# Patient Record
Sex: Male | Born: 1935 | Race: White | Hispanic: No | Smoking: Never smoker
Health system: Southern US, Community
[De-identification: ages and names within clinical notes are randomized; demographics above are authoritative.]

## PROBLEM LIST (undated history)

## (undated) DIAGNOSIS — I340 Nonrheumatic mitral (valve) insufficiency: Secondary | ICD-10-CM

## (undated) DIAGNOSIS — E039 Hypothyroidism, unspecified: Secondary | ICD-10-CM

## (undated) DIAGNOSIS — I4891 Unspecified atrial fibrillation: Secondary | ICD-10-CM

## (undated) DIAGNOSIS — M353 Polymyalgia rheumatica: Secondary | ICD-10-CM

## (undated) DIAGNOSIS — K219 Gastro-esophageal reflux disease without esophagitis: Secondary | ICD-10-CM

## (undated) DIAGNOSIS — IMO0001 Reserved for inherently not codable concepts without codable children: Secondary | ICD-10-CM

## (undated) DIAGNOSIS — J439 Emphysema, unspecified: Secondary | ICD-10-CM

## (undated) DIAGNOSIS — L8990 Pressure ulcer of unspecified site, unspecified stage: Secondary | ICD-10-CM

## (undated) DIAGNOSIS — I10 Essential (primary) hypertension: Secondary | ICD-10-CM

## (undated) DIAGNOSIS — N200 Calculus of kidney: Secondary | ICD-10-CM

## (undated) DIAGNOSIS — G4700 Insomnia, unspecified: Secondary | ICD-10-CM

## (undated) DIAGNOSIS — N184 Chronic kidney disease, stage 4 (severe): Secondary | ICD-10-CM

## (undated) DIAGNOSIS — I350 Nonrheumatic aortic (valve) stenosis: Secondary | ICD-10-CM

## (undated) DIAGNOSIS — R972 Elevated prostate specific antigen [PSA]: Secondary | ICD-10-CM

## (undated) HISTORY — DX: Elevated prostate specific antigen (PSA): R97.20

## (undated) HISTORY — DX: Gastro-esophageal reflux disease without esophagitis: K21.9

## (undated) HISTORY — PX: APPENDECTOMY: SHX54

## (undated) HISTORY — DX: Nonrheumatic aortic (valve) stenosis: I35.0

## (undated) HISTORY — DX: Calculus of kidney: N20.0

## (undated) HISTORY — DX: Hypothyroidism, unspecified: E03.9

## (undated) HISTORY — DX: Reserved for inherently not codable concepts without codable children: IMO0001

## (undated) HISTORY — PX: THYROIDECTOMY: SHX17

## (undated) HISTORY — DX: Pressure ulcer of unspecified site, unspecified stage: L89.90

## (undated) HISTORY — DX: Insomnia, unspecified: G47.00

## (undated) HISTORY — PX: CHOLECYSTECTOMY: SHX55

## (undated) HISTORY — DX: Polymyalgia rheumatica: M35.3

## (undated) HISTORY — PX: TONSILLECTOMY: SUR1361

---

## 2001-03-10 ENCOUNTER — Encounter: Payer: Self-pay | Admitting: Family Medicine

## 2001-03-10 ENCOUNTER — Inpatient Hospital Stay (HOSPITAL_COMMUNITY): Admission: RE | Admit: 2001-03-10 | Discharge: 2001-03-12 | Payer: Self-pay | Admitting: Family Medicine

## 2001-03-11 ENCOUNTER — Encounter: Payer: Self-pay | Admitting: Cardiology

## 2001-11-21 ENCOUNTER — Encounter: Payer: Self-pay | Admitting: Emergency Medicine

## 2001-11-21 ENCOUNTER — Emergency Department (HOSPITAL_COMMUNITY): Admission: EM | Admit: 2001-11-21 | Discharge: 2001-11-21 | Payer: Self-pay | Admitting: Emergency Medicine

## 2001-11-23 ENCOUNTER — Encounter: Payer: Self-pay | Admitting: *Deleted

## 2001-11-23 ENCOUNTER — Emergency Department (HOSPITAL_COMMUNITY): Admission: EM | Admit: 2001-11-23 | Discharge: 2001-11-23 | Payer: Self-pay | Admitting: *Deleted

## 2001-11-24 ENCOUNTER — Ambulatory Visit (HOSPITAL_COMMUNITY): Admission: RE | Admit: 2001-11-24 | Discharge: 2001-11-24 | Payer: Self-pay | Admitting: Urology

## 2001-11-24 ENCOUNTER — Encounter: Payer: Self-pay | Admitting: Urology

## 2001-11-26 ENCOUNTER — Ambulatory Visit (HOSPITAL_COMMUNITY): Admission: RE | Admit: 2001-11-26 | Discharge: 2001-11-26 | Payer: Self-pay

## 2001-11-26 ENCOUNTER — Encounter: Payer: Self-pay | Admitting: Urology

## 2002-02-18 ENCOUNTER — Ambulatory Visit (HOSPITAL_COMMUNITY): Admission: RE | Admit: 2002-02-18 | Discharge: 2002-02-18 | Payer: Self-pay | Admitting: Urology

## 2002-02-18 ENCOUNTER — Encounter: Payer: Self-pay | Admitting: Urology

## 2002-02-26 ENCOUNTER — Encounter: Payer: Self-pay | Admitting: Urology

## 2002-02-26 ENCOUNTER — Ambulatory Visit (HOSPITAL_COMMUNITY): Admission: RE | Admit: 2002-02-26 | Discharge: 2002-02-26 | Payer: Self-pay | Admitting: Urology

## 2002-03-10 ENCOUNTER — Encounter: Payer: Self-pay | Admitting: Urology

## 2002-03-10 ENCOUNTER — Ambulatory Visit (HOSPITAL_COMMUNITY): Admission: RE | Admit: 2002-03-10 | Discharge: 2002-03-10 | Payer: Self-pay | Admitting: Urology

## 2002-03-24 ENCOUNTER — Ambulatory Visit (HOSPITAL_COMMUNITY): Admission: RE | Admit: 2002-03-24 | Discharge: 2002-03-24 | Payer: Self-pay | Admitting: Urology

## 2002-03-24 ENCOUNTER — Encounter: Payer: Self-pay | Admitting: Urology

## 2002-04-16 ENCOUNTER — Encounter: Payer: Self-pay | Admitting: Urology

## 2002-04-16 ENCOUNTER — Ambulatory Visit (HOSPITAL_COMMUNITY): Admission: RE | Admit: 2002-04-16 | Discharge: 2002-04-16 | Payer: Self-pay | Admitting: Urology

## 2002-05-01 ENCOUNTER — Ambulatory Visit (HOSPITAL_COMMUNITY): Admission: RE | Admit: 2002-05-01 | Discharge: 2002-05-01 | Payer: Self-pay | Admitting: Urology

## 2003-02-26 ENCOUNTER — Ambulatory Visit (HOSPITAL_COMMUNITY): Admission: RE | Admit: 2003-02-26 | Discharge: 2003-02-26 | Payer: Self-pay | Admitting: Family Medicine

## 2004-09-15 ENCOUNTER — Ambulatory Visit (HOSPITAL_COMMUNITY): Admission: RE | Admit: 2004-09-15 | Discharge: 2004-09-15 | Payer: Self-pay | Admitting: Family Medicine

## 2004-09-26 ENCOUNTER — Ambulatory Visit: Payer: Self-pay | Admitting: Internal Medicine

## 2004-11-21 ENCOUNTER — Ambulatory Visit (HOSPITAL_COMMUNITY): Admission: RE | Admit: 2004-11-21 | Discharge: 2004-11-21 | Payer: Self-pay | Admitting: Family Medicine

## 2004-12-06 ENCOUNTER — Ambulatory Visit: Payer: Self-pay | Admitting: Orthopedic Surgery

## 2004-12-11 ENCOUNTER — Encounter (HOSPITAL_COMMUNITY): Admission: RE | Admit: 2004-12-11 | Discharge: 2005-01-10 | Payer: Self-pay | Admitting: Orthopedic Surgery

## 2005-01-12 ENCOUNTER — Encounter (HOSPITAL_COMMUNITY): Admission: RE | Admit: 2005-01-12 | Discharge: 2005-01-19 | Payer: Self-pay | Admitting: Orthopedic Surgery

## 2005-01-17 ENCOUNTER — Ambulatory Visit: Payer: Self-pay | Admitting: Orthopedic Surgery

## 2005-10-24 ENCOUNTER — Ambulatory Visit: Payer: Self-pay | Admitting: Internal Medicine

## 2005-10-25 ENCOUNTER — Ambulatory Visit (HOSPITAL_COMMUNITY): Admission: RE | Admit: 2005-10-25 | Discharge: 2005-10-25 | Payer: Self-pay | Admitting: Internal Medicine

## 2005-10-30 ENCOUNTER — Ambulatory Visit (HOSPITAL_COMMUNITY): Admission: RE | Admit: 2005-10-30 | Discharge: 2005-10-30 | Payer: Self-pay | Admitting: Internal Medicine

## 2006-02-06 ENCOUNTER — Ambulatory Visit: Payer: Self-pay | Admitting: Internal Medicine

## 2006-12-23 ENCOUNTER — Other Ambulatory Visit: Admission: RE | Admit: 2006-12-23 | Discharge: 2006-12-23 | Payer: Self-pay | Admitting: General Surgery

## 2006-12-23 ENCOUNTER — Encounter (INDEPENDENT_AMBULATORY_CARE_PROVIDER_SITE_OTHER): Payer: Self-pay | Admitting: General Surgery

## 2007-03-04 ENCOUNTER — Ambulatory Visit: Payer: Self-pay | Admitting: Internal Medicine

## 2007-03-06 ENCOUNTER — Ambulatory Visit (HOSPITAL_COMMUNITY): Admission: RE | Admit: 2007-03-06 | Discharge: 2007-03-06 | Payer: Self-pay | Admitting: Internal Medicine

## 2007-03-17 ENCOUNTER — Ambulatory Visit (HOSPITAL_COMMUNITY): Admission: RE | Admit: 2007-03-17 | Discharge: 2007-03-17 | Payer: Self-pay | Admitting: Internal Medicine

## 2007-03-17 ENCOUNTER — Encounter: Payer: Self-pay | Admitting: Internal Medicine

## 2007-03-17 ENCOUNTER — Ambulatory Visit: Payer: Self-pay | Admitting: Internal Medicine

## 2007-03-24 ENCOUNTER — Ambulatory Visit (HOSPITAL_COMMUNITY): Admission: RE | Admit: 2007-03-24 | Discharge: 2007-03-24 | Payer: Self-pay | Admitting: Internal Medicine

## 2007-04-07 ENCOUNTER — Encounter (HOSPITAL_COMMUNITY): Admission: RE | Admit: 2007-04-07 | Discharge: 2007-05-07 | Payer: Self-pay | Admitting: Internal Medicine

## 2007-04-14 ENCOUNTER — Ambulatory Visit: Payer: Self-pay | Admitting: Cardiology

## 2007-04-16 ENCOUNTER — Inpatient Hospital Stay (HOSPITAL_COMMUNITY): Admission: RE | Admit: 2007-04-16 | Discharge: 2007-04-18 | Payer: Self-pay | Admitting: General Surgery

## 2007-04-16 ENCOUNTER — Encounter (INDEPENDENT_AMBULATORY_CARE_PROVIDER_SITE_OTHER): Payer: Self-pay | Admitting: General Surgery

## 2008-03-13 ENCOUNTER — Emergency Department (HOSPITAL_COMMUNITY): Admission: EM | Admit: 2008-03-13 | Discharge: 2008-03-13 | Payer: Self-pay | Admitting: Emergency Medicine

## 2008-10-05 DIAGNOSIS — I10 Essential (primary) hypertension: Secondary | ICD-10-CM

## 2008-10-05 DIAGNOSIS — K828 Other specified diseases of gallbladder: Secondary | ICD-10-CM

## 2010-01-09 ENCOUNTER — Ambulatory Visit (HOSPITAL_COMMUNITY)
Admission: RE | Admit: 2010-01-09 | Discharge: 2010-01-09 | Payer: Self-pay | Source: Home / Self Care | Attending: Endocrinology | Admitting: Endocrinology

## 2010-02-10 ENCOUNTER — Ambulatory Visit (HOSPITAL_COMMUNITY): Admission: RE | Admit: 2010-02-10 | Payer: Self-pay | Source: Home / Self Care | Admitting: Endocrinology

## 2010-03-01 ENCOUNTER — Encounter: Payer: Self-pay | Admitting: Endocrinology

## 2010-05-16 LAB — URINALYSIS, ROUTINE W REFLEX MICROSCOPIC
Bilirubin Urine: NEGATIVE
Glucose, UA: NEGATIVE mg/dL
Hgb urine dipstick: NEGATIVE
Ketones, ur: NEGATIVE mg/dL
Leukocytes, UA: NEGATIVE
Nitrite: NEGATIVE
Protein, ur: 30 mg/dL — AB
Specific Gravity, Urine: >1.03 — ABNORMAL HIGH (ref 1.005–1.030)
Urobilinogen, UA: 0.2 mg/dL (ref 0.0–1.0)
pH: 5.5 (ref 5.0–8.0)

## 2010-05-16 LAB — URINE CULTURE
Colony Count: NO GROWTH
Culture: NO GROWTH

## 2010-05-16 LAB — URINE MICROSCOPIC-ADD ON

## 2010-05-22 ENCOUNTER — Other Ambulatory Visit (HOSPITAL_COMMUNITY): Payer: Self-pay | Admitting: Endocrinology

## 2010-05-22 DIAGNOSIS — E041 Nontoxic single thyroid nodule: Secondary | ICD-10-CM

## 2010-06-13 NOTE — Op Note (Signed)
NAME:  JHALEN, ELEY NO.:  [id number]   MEDICAL RECORD NO.:  [id number]          PATIENT TYPE:  INP   LOCATION:  A338                          FACILITY:  [facility]   PHYSICIAN:  Barbaraann Barthel, M.D. DATE OF BIRTH:  1921-12-26   DATE OF PROCEDURE:  04/16/2007  DATE OF DISCHARGE:                               OPERATIVE REPORT   SURGEON:  Barbaraann Barthel, M.D.   PREOPERATIVE DIAGNOSIS:  Cholecystitis secondary to biliary dyskinesia.   POSTOPERATIVE DIAGNOSIS:  Cholecystitis secondary to biliary dyskinesia.   PROCEDURE:  Laparoscopic cholecystectomy.   SPECIMEN:  Gallbladder.   NOTE:  This is an 77 year old white male who had recurrent episodes of  nausea with some vomiting and right upper quadrant pain.  He had a  sonogram which showed biliary sludge and polyp.  He had no obvious  stones; however, he had a complete GI workup including an upper GI and a  lower GI as well.  Hepatobiliary scan showed that he had an  nonfunctioning gallbladder with less than a 1% ejection fraction.   He was referred by the GI service for gallbladder surgery.   We discussed the procedure in detail with him and his family.  His  daughter had been a gallbladder patient of mine and all questions were  addressed knowledgeably by the family.   We discussed complications not limited to but including bleeding,  infection, damage to bile ducts, perforation of organs and the  possibility that open surgery might be required.  The also discussed  that he might have some looser stools after surgery as well.  We also  discussed the fact that the results for cholecystectomy for biliary  dyskinesia are not as satisfying as the results for cholecystitis  secondary to gallstones.  Informed consent was obtained.   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  Dilated stomach and bowel but no  abnormalities encountered.   TECHNIQUE:  The patient was placed in the supine position and after the  adequate administration of general anesthesia via endotracheal  intubation, a Foley catheter was aseptically inserted.  His abdomen was  prepped with Betadine solution and draped in the usual manner and a  periumbilical incision was carried out over the superior aspect of the  umbilicus.  The fascia was grasped with a sharp towel clip and a Veress  needle was inserted and confirmed in position with a saline drop test.  We then we placed an 11-mm cannula in the umbilicus using the Visiport  technique and then under direct vision another 11-mm cannula was placed  in the epigastrium and two 5-mm cannulas were placed in the right upper  quadrant laterally.  The gallbladder was grasped.  Its adhesions were  taken down.  Cystic duct was clearly visualized, as was the cystic  artery.  This was triply silver-clipped and divided and the gallbladder  was removed from the gallbladder bed with minimal amount of spilling and  with a minimal amount of oozing.  The  patient did have a little oozing  from a very friable liver from the capsule of Gleason being so friable,  but this was easily controlled with the cautery device.  The gallbladder  was then removed placing it in an EndoCatch device.  We then irrigated,  checked for hemostasis.  I elected to leave a Jackson-Pratt drain in the  liver bed as well as some Surgicel in the liver bed as well.  The  abdomen was then desufflated.  Prior to closure all sponge, needle and  instrument counts were found to be correct.  The fascia in the area of  the umbilicus and the epigastrium was closed with 0 Polysorb sutures and  the skin was approximated with a stapling device.  I did use 0.5%  Sensorcaine at all port sites for postoperative comfort.  The drain was  sutured in place with 3-0 nylon.  The patient was taken to recovery room  in satisfactory  condition.      Barbaraann Barthel, M.D.  Electronically Signed     WB/MEDQ  D:  04/16/2007  T:  04/16/2007  Job:  [id number]   cc:   Donna Bernard, M.D.  Fax: [phone]

## 2010-06-13 NOTE — Letter (Signed)
April 14, 2007    Barbaraann Barthel, M.D.  [address]  [place], [place] [address]   RE:  Jeffrey Mccarty, Jeffrey Mccarty  MRN:  [id number]  /  DOB:  Apr 28, 1921   Dear Bronson Curb:   It was my pleasure evaluating Jeffrey Mccarty postoperatively in  consultation at your request.  As you know, this nice gentleman has  enjoyed generally excellent health.  He has had hypertension that has  been well controlled with medical therapy.  He reports no prior cardiac  problems.  He has never been evaluated by a cardiologist nor undergone  any significant cardiac testing.  He has had no pulmonary diseases.  He  is unaware of his cholesterol status.  He was active in the past, but  subsequently developed a rotator cuff tear in his right shoulder and is  relatively sedentary at the present time.  He has lost some weight, has  had a decreased appetite, chronic upper abdominal pain and a  dysfunctional gallbladder without cholecystolithiasis.   PAST MEDICAL HISTORY:  Otherwise notable for:  1. Appendectomy, carried out remotely.  2. A partial thyroidectomy, carried out remotely.   ALLERGIES:  No known drug allergies.   CURRENT MEDICATIONS:  1. Xanax 0.25 mg q.h.s.  2. Nexium 40 mg daily.  3. Lotensin/HCT 20/12.5 mg daily.   SOCIAL HISTORY:  A widower for the past 14 years.  Has two adult  daughters who live locally and provide him with a great deal of  assistance.  He is retired from a second career in Aeronautical engineer.   FAMILY HISTORY:  Positive for emphysema in his father, who still managed  to live into his 40s.  His mother died at age 33 in childbirth.  He has  five siblings, none of whom have known coronary disease.   REVIEW OF SYSTEMS:  Notable for the need for corrective lenses,  bilateral cataract extractions in the past, moderate to severe hearing  impairment, upper and lower dentures, a poor appetite with intermittent  nausea but no emesis, history of peptic ulcer disease and GERD.  He has  urinary frequency and  nocturia.  All other systems reviewed and are  negative.   PHYSICAL EXAMINATION:  GENERAL:  A slim older gentleman in no acute  distress.  VITAL SIGNS:  The weight is 114, blood pressure 110/70, heart rate 85  with frequent irregularities, respirations 18.  HEENT:  Extraocular movements are full.  Anicteric sclerae.  Normal lids  and conjunctivae.  Normal oral mucosa.  NECK:  Thyroidectomy scar is barely appreciated.  No jugular venous  distention; normal carotid upstrokes without definite bruits.  ENDOCRINE:  No thyromegaly.  HEMATOPOIETIC:  No adenopathy.  LUNGS:  Clear.  CARDIAC:  Normal first and second heart sounds.  A grade 2/6 basilar  systolic ejection murmur.  Normal PMI.  ABDOMEN:  Soft with mild upper abdominal tenderness; normal bowel  sounds; no bruits; no organomegaly.  EXTREMITIES:  No edema; distal pulses intact.  NEUROLOGIC:  Symmetric strength and tone; normal cranial nerves.   ELECTROCARDIOGRAM:  Normal sinus rhythm; nondiagnostic lateral Q-waves;  moderately frequent PACs; nonspecific ST-segment abnormality; minimal  voltage criteria for LVH.   IMPRESSION:  Jeffrey Mccarty has no history to suggest significant  cardiovascular disease.  He does have minor supraventricular ectopy, but  this is not predictive of any significant cardiac issues.  He has a  systolic murmur that probably represents a degree of aortic valvular  sclerosis.  There is nothing to suggest significant stenosis.  He does  not require any specific cardiopulmonary testing prior to his planned  surgical procedure.  His blood pressure control is  excellent.  If his weight loss continues, he may need to have his dose  of antihypertensive medication decreased.  Please let us know if we can  provide any assistance during his hospital admission for  cholecystectomy.    Sincerely,      Gerrit Friends. Dietrich Pates, MD, [facility]  Electronically Signed    RMR/MedQ  DD: 04/14/2007  DT: 04/14/2007  Job #:  [id number]   CC:    W. Simone Curia, M.D.

## 2010-06-13 NOTE — Assessment & Plan Note (Signed)
NAMEMarland Kitchen  Jeffrey, Mccarty                  CHART#:  [id number]   DATE:  03/04/2007                       DOB:  1921/05/05   Formerly, the patient went to Dr. Dionicia Abler.  The patient has chosen to stay  in the practice with [place].  The patient was last seen on 02/06/2006.  He  has a history of gastroesophageal reflux disease, anorexia and weight  loss.  At that time he was noted to weigh 123 pounds, and overall was  doing fairly well.  He had a cystic lesion in his pancreas which  warranted further followup.  In reviewing the chart, we had put in an  order for him to have a followup pancreatic protocol CT in March;  however, this did not get done, and the patient and his oldest daughter,  Jeffrey Mccarty, who accompanies him today really do not know a thing about it.  The patient said he had gastroenteritis over the holidays.  He has had  nausea, vomiting, diarrhea, but that has slowly gotten better.  He does  complain of some mild, nonspecific upper abdominal pain.  Again, no  nausea and vomiting now, no melena or hematochezia, constipation,  either, at this point.  He does not have any early satiety.  He tells me  that he has a great appetite.  His reflux symptoms are well controlled  on Nexium 40 mg orally daily.  Back in 1998, he had a peptic stricture  dilated.  He is no longer having any dysphagia.   I note that this gentleman has never had a colonoscopy.  His weight is  down 1 pound from what it was on 02/06/2006.   CURRENT MEDICATIONS:  1. Nexium 40 mg orally daily.  2. Benazepril/hydrochlorothiazide 20/12.5 mg daily.   ALLERGIES:  No known drug allergies.   PHYSICAL EXAMINATION:  A pleasant, elderly gentleman accompanied by his  daughter.  Weight 122, height 5 feet 2 inches, temp 97.7, BP 120/60, pulse 80.  CHEST:  Lungs are clear to auscultation.  CARDIAC EXAM:  Regular rate and rhythm without murmurs, gallops, or  rubs.  He has a well-healed surgical scar, right abdomen, nondistended, he  has  good bowel sounds.  He does have some tenderness in the right upper  quadrant.  He has a prominent, what I believe, is the left lobe of the  liver which is where he is tender.  Otherwise, no obvious mass or  organomegaly.  EXTREMITY EXAM:  No edema.   IMPRESSION:  The patient is a pleasant, 77 year old gentleman with a  pancreatic cystic lesion seen on prior CT scan for which followup was  planned nearly a year ago, but has not yet been done.  The patient now  has some nonspecific abdominal pain and has a tender right upper  quadrant.  I believe his left lobe of his liver is enlarged or at least  prominent and tender to palpation.  Not mentioned above, his gallbladder  remains in situ, and the previously noted sebaceous cyst, right abdomen,  was resected with Dr. Malvin Johns since he was last here in January of  2008.  His reflux symptoms seem to be under well control.   He has never had a screening colonoscopy.   RECOMMENDATIONS:  1. Will move forward directly with repeat CT of the abdomen and  pelvis      without any oral contrast forthwith, and will review that study      when it becomes available.  Will continue Nexium 40 mg orally      daily.  I have given him 1 year prescription and a month's worth of      free samples.  2. Will make further recommendations in the very near future.       Jonathon Bellows, M.D.  Electronically Signed     RMR/MEDQ  D:  03/04/2007  T:  03/05/2007  Job:  [id number]   cc:   Lorin Picket A. Gerda Diss, MD

## 2010-06-13 NOTE — Op Note (Signed)
NAME:  Jeffrey Mccarty, Jeffrey Mccarty                 ACCOUNT NO.:  [id number]   MEDICAL RECORD NO.:  [id number]          PATIENT TYPE:  AMB   LOCATION:  DAY                           FACILITY:  [facility]   PHYSICIAN:  R. Roetta Sessions, M.D. DATE OF BIRTH:  10-Aug-1921   DATE OF PROCEDURE:  03/17/2007  DATE OF DISCHARGE:                               OPERATIVE REPORT   PROCEDURE:  Esophagogastroduodenoscopy with biopsy.   INDICATIONS FOR PROCEDURE:  An 78 year old gentleman with nonspecific  right upper quadrant abdominal pain.  Some associated anorexia and  weight loss.  CT last year previously demonstrated some cystic lesion.  It was to have been repeated last year but this did not get done.  He  has not had any melena or rectal bleeding.  He has never had a  colonoscopy.  Follow-up CT was indeed done on February 5.  In addition  to stable low density structure along the anterior aspect of pancreatic  body.  There was minimal dilation of the extrahepatic bile duct and a  chronic asymmetrical thickening of the gastric body greater curvature.  There was some concern for thickening of the mucosa of the third, fourth  portion of duodenum.  Because these findings and his symptoms, EGD is  now being done.  This approach has been discussed with the patient at  length.  Potential risks, benefits and alternatives have been reviewed,  questions answered.  He is agreeable.  Please see documentation in  medical record.   PROCEDURE NOTE:  O2 saturation, blood pressure, pulse and respirations  were monitored throughout the entire procedure.  Conscious sedation  Versed 2 mg IV, Demerol 50 mg IV in divided doses.   INSTRUMENT:  Pentax video chip system.   FINDINGS:  Examination of tubular esophagus revealed no mucosal  abnormalities.  EG junction easily traversed.  Stomach:  Gastric cavity was empty.  It insufflated well with air.  Thorough examination of gastric mucosa including retroflex view of  proximal stomach,  esophagogastric junction demonstrated a small hiatal  hernia and a slightly glandular appearing mucosa on the anterior  abdominal wall.  This was more on the lesser curvature.  I did not see  any evidence for infiltrating process, ulcer or other abnormality.  Please see multiple photos.  Pylorus was patent, easily traversed.  Examination of the bulb, second, and third portion revealed no  abnormalities.  I believe I could see well down into the fourth portion  of the duodenum and this segment appeared grossly normal from some  distance.   THERAPEUTIC/DIAGNOSTIC MANEUVERS PERFORMED:  Biopsies of the glandular-  appearing gastric mucosa taken for histologic study.  The patient  tolerated the procedure well, was reacted in endoscopy.   IMPRESSION:  Normal esophagus, small hiatal hernia.  Gastric mucosa  appeared normal except for somewhat glandular appearing mucosa, lesser  curvature, anterior wall (I think this is of doubtful clinical  significance) biopsied.  Otherwise normal-appearing gastric mucosa,  patent pylorus, normal D1 through D3.   These findings do not explain this gentleman's recent symptoms.   RECOMMENDATIONS:  Will go ahead  and check a gallbladder ultrasound,  hepatic profile, amylase, and lipase.  Make further recommendations in  the very near future.  He ought to consider having his first ever  colonoscopy between now and the spring. Follow-up on path.      Jonathon Bellows, M.D.  Electronically Signed     RMR/MEDQ  D:  03/17/2007  T:  03/18/2007  Job:  [id number]   cc:   Lorin Picket A. Gerda Diss, MD  Fax: [phone]

## 2010-06-13 NOTE — Discharge Summary (Signed)
Jeffrey Mccarty, Jeffrey Mccarty NO.:  [id number]   MEDICAL RECORD NO.:  [id number]          PATIENT TYPE:  INP   LOCATION:  A338                          FACILITY:  [facility]   PHYSICIAN:  Barbaraann Barthel, M.D. DATE OF BIRTH:  1921/12/13   DATE OF ADMISSION:  04/16/2007  DATE OF DISCHARGE:  03/20/2009LH                               DISCHARGE SUMMARY   PROCEDURE:  On March 30, 2007, laparoscopic cholecystectomy.   COMPLICATIONS:  1. Atelectasis/pneumonia.  2. Urinary retention.   SECONDARY DIAGNOSIS:  Hypertension.   NOTE:  This is an 77 year old white male who was admitted via the  outpatient department for a laparoscopic cholecystectomy secondary to  biliary dyskinesia.  This patient had a complete GI workup and was found  to have a nonfunctioning gallbladder with less than a 1% ejection  fraction and typical symptoms of right upper quadrant pain with nausea  and vomiting that was postprandial in nature.  The patient was referred  by the GI service for a laparoscopic cholecystectomy.  This took place  uneventfully on the April 16, 2007, via the outpatient department.  The  patient did very well.  The surgery was approximately 30 minutes in  duration and the patient's surgery was otherwise uneventful.  Postoperatively, however, the patient did have some atelectasis.  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 Lubertha South to see the  patient, as this is his medical patient, and he placed the patient on  antibiotics after a chest x-ray showed a little bit more than what  looked to be a normal infiltrate, atelectatic-type changes and possible  early pneumonia, and we treated him with Rocephin and kept him in the  hospital on extra day.  He also during that  time developed some urinary  retention.  I consulted Dr. Jerre Simon to see him prior to discharge and he  will be discharged with a Foley bag with a  leg strap to Foley bag and  follow-up regarding this will be as per Dr. Jerre Simon.   Otherwise, the patient did very well postoperatively and at the time of  discharge he was tolerating p.o. very well, his wounds were clean, there  was minimal JP drainage.  This was removed on April 18, 2007, prior to  discharge.  His temperature was 99 and his blood pressure 111/57, heart  rate 71, respirations of 20, and he was eating well and his wounds were  very clean.  Unfortunately, he did have to have the Foley catheter  reinserted due to urinary retention, but that will be addressed as an  outpatient.   DISCHARGE INSTRUCTIONS:  He is he is discharged on a full liquid and  soft diet.  He is told to clean his wounds with alcohol and he is told  to do no heavy lifting.  He is permitted to walk indoors and outdoors  and up and down the stairs.  He may shower on April 19, 2007.   He is discharged on his usual medications, which include:  1.  Nexium 40 mg daily.  2. benazepril/hydrochlorothiazide 20 mg daily.  3. He is also given a prescription for Darvocet 1 tablet every 4 hours      as needed for pain.  4. He is discharged on Cefzil 500 mg p.o. b.i.d.  5. Also told to continue his incentive spirometry as instructed.   We will follow him perioperatively and arrangements have been made with  regarding that.  We will see him on Thursday, March 26, at 10 a.m.  The  patient and his family are told to call us should he have any acute  changes or go to the emergency room.  Follow-up arrangements also have  been made with the urology department and Dr. Gerda Diss for appropriate  follow-up regarding urinary retention and respiratory problems,  respectively.        Barbaraann Barthel, M.D.  Electronically Signed     WB/MEDQ  D:  04/18/2007  T:  04/18/2007  Job:  [id number]   cc:   Donna Bernard, M.D.  Fax: [phone]   Ky Barban, M.D.  Fax: [phone]   R. Roetta Sessions, M.D.  [address]  [place]  [place] [address]

## 2010-06-16 NOTE — Discharge Summary (Signed)
[facility]  Patient:    Jeffrey Mccarty, Jeffrey Mccarty Visit Number: [id number] MRN: [id number]          Service Type: MED Location: 2A A223 01 Attending Physician:  Harlow Asa Dictated by:   Donna Bernard, M.D. Admit Date:  03/10/2001 Discharge Date: 03/12/2001                             Discharge Summary  FINAL DIAGNOSES: 1. Chest pain, myocardial infarction ruled out. 2. Hypertension. 3. History of hypothyroidism. 4. Reflux.  DISPOSITION:  The patient was discharged home.  MEDICATIONS: 1. The patient was encouraged to stop verapamil and start Lotensin 1 tablet    daily. 2. Prilosec p.r.n. for reflux symptoms.  FOLLOW-UP:  In the office in one week.  HISTORY OF PRESENT ILLNESS AND HOSPITAL COURSE:  Please see H&P as dictated. This patient is a 77 year old white male with a history of prior hypothyroidism, reflux, and hypertension.  He presented to the office on the day of admission with complaints of chest discomfort, tightness in the chest, and shortness of breath.  He described it as a deep heaviness.  He is admitted to the hospital based primarily on his symptomology.  His EKG was nonspecific. Cardiac enzymes were ordered.  These proved to be negative.  The patient did have a bit of shortness of breath, and his exam did reveal some minimal crackles in the bases.  The cardiology folks were consulted, and a Cardiolite stress test was performed.  This proved to be negative.  Chest x-ray showed mild cardiomegaly.  Cardiolite stress test returned normal.  EKG showed increased voltage.  Echocardiogram showed changes consistent with age with low normal systolic function.  The patient was given subcutaneous Lovenox for the possibility of coronary ischemia.  When the tests returned negative this was stopped.  The cardiologist felt the patient could be discharged home.  The patient was discharged home with diagnoses and disposition as noted above. Dictated  by:   Donna Bernard, M.D. Attending Physician:  Harlow Asa DD:  04/27/01 TD:  04/27/01 Job: [id number]

## 2010-06-16 NOTE — H&P (Signed)
   NAME:  Jeffrey Mccarty, Jeffrey Mccarty                           ACCOUNT NO.:  [id number]   MEDICAL RECORD NO.:  [id number]                   PATIENT TYPE:  AMB   LOCATION:  DAY                                  FACILITY:  [facility]   PHYSICIAN:  Ky Barban, M.D.            DATE OF BIRTH:  Mar 30, 1921   DATE OF ADMISSION:  02/19/2002  DATE OF DISCHARGE:                                HISTORY & PHYSICAL   CHIEF COMPLAINT:  Left renal colic.   HISTORY:  [age]-year-old male has been followed by me since October of  2003 and at that time, he had left renal colic and was found to have stones  in left ureter and also left kidney, which was a 2-cm stone.  Subsequently,  he underwent stone basketing for the left ureteral calculi and now he is  being brought as an outpatient to undergo ESL for the left renal calculus.  Procedure, its limitations and need for additional procedure are discussed  and he understands.   PAST MEDICAL HISTORY:  1. He has BPH with bladder neck obstruction and is quite symptomatic.  2. He has a history of having hypertension.   Past medical history also include thyroidectomy, tonsillectomy,  appendectomy, right inguinal hernia repair.   MEDICATIONS:  He is taking medicines for his hypertension and thyroid  replacement.   ALLERGIES:  None.   REVIEW OF SYSTEMS:  Unremarkable.   PHYSICAL EXAMINATION:  GENERAL:  On examination, a well-nourished, well-  developed male.  VITAL SIGNS:  Blood pressure is 170/80.  Temperature is normal.  CENTRAL NERVOUS SYSTEM:  No gross neurological deficit.  HEENT/NECK:  Negative.  Neck is supple.  No lymphadenopathy.  CHEST:  Chest is clear.  Normal breath sounds.  HEART:  Regular sinus rhythm.  No murmur.  ABDOMEN:  Abdomen is soft and flat.  Liver, spleen and kidneys are not  palpable.  No CVA tenderness.  No masses palpable.  The bladder is not  distended.  EXTERNAL GENITALIA:  Circumcised meatus, adequate.  Testicles are normal.  RECTAL:  Prostate 1-1/2+, smooth and firm.   IMPRESSION:  1. Left renal calculus.  2.     Benign prostatic hypertrophy.  3. Hypertension.   PLAN:  ESL for left renal calculus.                                               Ky Barban, M.D.    MIJ/MEDQ  D:  02/17/2002  T:  02/18/2002  Job:  [id number]

## 2010-06-16 NOTE — H&P (Signed)
NAME:  Jeffrey Mccarty, Jeffrey Mccarty                           ACCOUNT NO.:  [id number]   MEDICAL RECORD NO.:  [id number]                   PATIENT TYPE:  AMB   LOCATION:  DAY                                  FACILITY:  [facility]   PHYSICIAN:  Ky Barban, M.D.            DATE OF BIRTH:  30-Aug-1921   DATE OF ADMISSION:  DATE OF DISCHARGE:                                HISTORY & PHYSICAL   CHIEF COMPLAINT:  Left flank pain.   HISTORY:  A 77 year old gentleman who came to the emergency room last Sunday  with left renal colic.  He had came twice during the day to the emergency  room with the same problem.  CT scan was done that showed that he has a 2 cm  stone in the left kidney and also an 8 mm stone in the left upper ureter  causing moderate hydronephrosis.  He was not having any fever, chills or  gross hematuria.  He was treated symptomatically and sent home.  I saw him  in the office and he told me he is not having significant pain.  Left KUB  was done.  The stone in the kidney was really visible, but the stone in the  ureter was very poorly visualized.  I am not sure if there was a stone, but  the CT scan showed an 8 mm stone, so I have advised him to come in as an  outpatient to undergo cystoscopy, left retrograde pyelogram and if the stone  has moved into the distal ureter I might do a stone basket, and the  possibility that I can put in a double J stent and bring him back to do  ureteroscopic stone extraction at a later date.  So, basically he is coming  as an outpatient to have a diagnostic retrograde pyelogram.  Once we have  cleared the ureter I told him that we can then treat his stone in the kidney  with lithotripsy.   PAST MEDICAL HISTORY:  Significant for BPH with bladder neck obstruction and  is quite symptomatic.  I also hope that during cystoscopy we will find out  about the status of his prostatic obstruction.  He also has a history of  having  hypertension.   PAST SURGICAL  HISTORY:  Status post thyroidectomy, tonsillectomy,  appendectomy and right inguinal hernia repair.   MEDICATIONS:  He takes medicine for his hypertension and thyroid  replacement.   ALLERGIES:  None.   REVIEW OF SYSTEMS:  Otherwise unremarkable.   PHYSICAL EXAMINATION:  VITAL SIGNS:  On examination blood pressure is 170/80  and temperature is normal.  CENTRAL NERVOUS SYSTEM:  No gross neurological deficit.  HEAD, NECK AND EENT:  Negative.  CHEST:  Clear.  HEART:  Regular sinus rhythm.  ABDOMEN:  Soft and flat.  Liver, spleen and kidneys not palpable.  BACK:  No CVA tenderness.  EXTERNAL GENITALIA:  Circumcised.  Meatus is a good size.  Testicles are  normal.  There is a 2 cm left epidermal cyst.  RECTAL EXAMINATION:  Prostate 2+, smooth and firm.   IMPRESSION:  1. Left renal and ureteral calculi.  2. Hypertension.  3. Benign prostatic hypertrophy.   PLAN:  Cystoscopy, left retrograde pyelogram, possible stone basket and  possible double J stent insertion.                                               Ky Barban, M.D.    MIJ/MEDQ  D:  11/25/2001  T:  11/26/2001  Job:  [id number]

## 2010-06-16 NOTE — Procedures (Signed)
[facility]  Patient:    Jeffrey Mccarty, Jeffrey Mccarty Visit Number: [id number] MRN: [id number]          Service Type: MED Location: 2A A223 01 Attending Physician:  Harlow Asa Dictated by:   Lilyan Punt, M.D. Proc. Date: 03/10/01 Admit Date:  03/10/2001 Discharge Date: 03/12/2001                            EKG Interpretations  Normal sinus rhythm.  Early transition is noted and almost voltage criteria in 2.  Does have voltage criteria in 1 along with atypical ST pattern consistent with possible LVH. Dictated by:   Lilyan Punt, M.D. Attending Physician:  Harlow Asa DD:  03/27/01 TD:  03/27/01 Job: [id number]

## 2010-06-16 NOTE — H&P (Signed)
   NAME:  Jeffrey Mccarty, Jeffrey Mccarty                           ACCOUNT NO.:  [id number]   MEDICAL RECORD NO.:  [id number]                   PATIENT TYPE:  AMB   LOCATION:  DAY                                  FACILITY:  [facility]   PHYSICIAN:  Ky Barban, M.D.            DATE OF BIRTH:  1921/11/01   DATE OF ADMISSION:  DATE OF DISCHARGE:                                HISTORY & PHYSICAL   CHIEF COMPLAINT:  Left distal ureteral calculus.   HISTORY:  [age]-year-old gentleman had a large stone in his left  kidney, underwent ESL and there is a 6-mm stone left in the distal left  ureter which is causing minimum obstruction.  It has been watched for the  last six weeks.  [name] is not coming out.  The patient has requested that he  wanted me to go ahead and remove it, so he is coming as an outpatient and  will undergo ureteroscopic stone basket, possible use of double J stent.   PAST MEDICAL HISTORY:  He had an ESL done for a left renal calculus and  before that, he had a stone basket done on that same side; also includes  thyroidectomy, tonsillectomy, appendectomy and right inguinal hernia repair.   MEDICATIONS:  He has hypertension and thyroid replacement therapy.   ALLERGIES:  None.   REVIEW OF SYSTEMS:  Review of systems otherwise unremarkable.   PHYSICAL EXAMINATION:  GENERAL:  On examination, a moderately built male,  not in no acute distress.  VITAL SIGNS:  Blood pressure is 140/80.  Temperature is normal.  CENTRAL NERVOUS SYSTEM:  Negative.  HEENT/NECK:  Negative.  CHEST:  Chest symmetrical.  HEART:  Regular sinus rhythm with no murmur.  ABDOMEN:  Abdomen is soft, flat.  Liver, spleen and kidneys are not  palpable.  RECTAL:  Exam deferred.  EXTREMITIES:  Normal.   IMPRESSION:  1. Left distal ureteral calculus.  2. Hypertension.  3. Benign prostatic hypertrophy.   PLAN:  ESL and ureteroscopic stone basket.                                               Ky Barban,  M.D.   MIJ/MEDQ  D:  04/30/2002  T:  04/30/2002  Job:  [id number]

## 2010-06-16 NOTE — Op Note (Signed)
   NAME:  Jeffrey Mccarty, Jeffrey Mccarty                           ACCOUNT NO.:  [id number]   MEDICAL RECORD NO.:  [id number]                   PATIENT TYPE:  AMB   LOCATION:  DAY                                  FACILITY:  [facility]   PHYSICIAN:  Ky Barban, M.D.            DATE OF BIRTH:  1921/12/30   DATE OF PROCEDURE:  05/01/2002  DATE OF DISCHARGE:                                 OPERATIVE REPORT   PREOPERATIVE DIAGNOSIS:  Left ureteral calculus.   POSTOPERATIVE DIAGNOSIS:  Left ureteral calculus.   PROCEDURES:  Cystoscopy, left retrograde pyelogram, holmium laser  lithotripsy, ureteroscopic stone basket with removal of the stone.   ANESTHESIA:  General.   DESCRIPTION OF PROCEDURE:  The patient given general anesthesia, placed in  lithotomy position, area prepped and draped.  A #25 cystoscope introduced  into the bladder.  It was inspected, looks normal.  The left ureteral  orifice catheterized with a wedge catheter.  Hypaque was injected.  There  was a filling defect in the ureterovesical junction.  A guidewire was passed  up into the renal pelvis and the cystoscope was removed.  Alongside the  guidewire a short rigid ureteroscope was introduced, the stone was  visualized, and using a 200 micron laser fiber, the stone was broken into  pieces and using a stone basket, pieces of it were removed completely and no  complications.  All the instruments were removed.  The patient left the  operating room in satisfactory condition.                                               Ky Barban, M.D.    MIJ/MEDQ  D:  05/01/2002  T:  05/02/2002  Job:  [id number]

## 2010-06-16 NOTE — Op Note (Signed)
   NAME:  YISHAI, REHFELD                           ACCOUNT NO.:  [id number]   MEDICAL RECORD NO.:  [id number]                   PATIENT TYPE:  AMB   LOCATION:  DAY                                  FACILITY:  [facility]   PHYSICIAN:  Ky Barban, M.D.            DATE OF BIRTH:  October 01, 1921   DATE OF PROCEDURE:  DATE OF DISCHARGE:                                 OPERATIVE REPORT   PREOPERATIVE DIAGNOSIS:  Left ureteral and renal calculi.   POSTOPERATIVE DIAGNOSES:  1. Left ureteral and renal calculi.  2. Benign prostatic hypertrophy.   PROCEDURE:  1. Cystoscopy.  2. Left retrograde pyelogram.  3. Stone basket removal of stone.   SURGEON:  Ky Barban, M.D.   ANESTHESIA:  Spinal   DESCRIPTION OF PROCEDURE:  The patient was given spinal anesthesia and  placed in the lithotomy position after sterile prep and drape.  A #25  cystoscope introduced into the bladder.  The prostatic urethra was  inspected.  It is friable, bleeds very easily, has enlarged prostate causing  bladder neck obstruction.  The bladder is 2+ to 3+ trabeculated with  saccules.   The trigone is inspected and I can see a stone sticking out of the left  ureteral orifice.  I passed a stone basket, the stone went up into the  ureter, but I was able to retrieve the stone with the basket on the way out.  The stone came out without any problems.   Then I inserted a wedge catheter into the left orifice, injected Hypaque  under fluoroscopic control.  Ureter is slightly dilated, but the dye goes  up, all the way, into the renal pelvis without any problem and then it  drains out nicely.  I do not see any more stone in the ureter.  He still has  a 2 cm stone in the left kidney, lower pole, calix.  We will take care of  that later.  At this point, I took the cystoscope out. I should mention,  that before taking the cystoscope out the stone which had fallen into the  bladder, one stone came out with the basket, but  there was a second piece  which was within the bladder so I inserted a flexible biopsy forceps to grab  the stone and removed it.   He as having some bleeding from the prostatic urethra so I inserted a Foley  catheter, #18.  The patient left the operating room in satisfactory  condition.                                              Ky Barban, M.D.   MIJ/MEDQ  D:  11/26/2001  T:  11/26/2001  Job:  [id number]

## 2010-07-24 ENCOUNTER — Ambulatory Visit (HOSPITAL_COMMUNITY)
Admission: RE | Admit: 2010-07-24 | Discharge: 2010-07-24 | Disposition: A | Discharge status: Home / Self Care | Payer: Medicare Other | Source: Ambulatory Visit | Attending: Endocrinology | Admitting: Endocrinology

## 2010-07-24 DIAGNOSIS — E041 Nontoxic single thyroid nodule: Secondary | ICD-10-CM

## 2010-07-24 DIAGNOSIS — E049 Nontoxic goiter, unspecified: Secondary | ICD-10-CM | POA: Insufficient documentation

## 2010-10-20 LAB — HEPATIC FUNCTION PANEL
ALT: 15
AST: 24
Albumin: 3.7
Alkaline Phosphatase: 45
Bilirubin, Direct: 0.1
Indirect Bilirubin: 0.4
Total Bilirubin: 0.5
Total Protein: 7

## 2010-10-20 LAB — AMYLASE: Amylase: 199 — ABNORMAL HIGH

## 2010-10-23 LAB — CBC
HCT: 33.4 — ABNORMAL LOW
HCT: 39.1
Hemoglobin: 11.7 — ABNORMAL LOW
MCHC: 34.9
MCV: 89.8
Platelets: 326
RBC: 3.69 — ABNORMAL LOW
RBC: 4.36
WBC: 10.2
WBC: 6.5

## 2010-10-23 LAB — BASIC METABOLIC PANEL
BUN: 11
CO2: 31
Calcium: 8.3 — ABNORMAL LOW
Chloride: 94 — ABNORMAL LOW
Creatinine, Ser: 1.26
GFR calc Af Amer: >60
GFR calc non Af Amer: 56 — ABNORMAL LOW
Potassium: 3.8
Potassium: 4
Sodium: 132 — ABNORMAL LOW

## 2010-10-23 LAB — DIFFERENTIAL
Basophils Absolute: 0
Basophils Relative: 1
Eosinophils Relative: 1
Lymphocytes Relative: 11 — ABNORMAL LOW
Lymphs Abs: 1.1
Monocytes Absolute: 1.1 — ABNORMAL HIGH
Monocytes Relative: 12
Neutro Abs: 4
Neutro Abs: 7.9 — ABNORMAL HIGH
Neutrophils Relative %: 62

## 2010-10-23 LAB — HEPATIC FUNCTION PANEL
ALT: 26
AST: 26
AST: 40 — ABNORMAL HIGH
Albumin: 4
Alkaline Phosphatase: 43
Bilirubin, Direct: 0.2
Indirect Bilirubin: 0.5
Total Bilirubin: 0.8
Total Protein: 7.2

## 2010-10-23 LAB — PROTIME-INR: INR: 1.1

## 2010-10-23 LAB — APTT: aPTT: 33

## 2011-01-04 ENCOUNTER — Ambulatory Visit (INDEPENDENT_AMBULATORY_CARE_PROVIDER_SITE_OTHER): Payer: Medicare Other | Admitting: Otolaryngology

## 2012-02-12 ENCOUNTER — Other Ambulatory Visit (HOSPITAL_COMMUNITY): Payer: Self-pay | Admitting: "Endocrinology

## 2012-02-12 DIAGNOSIS — E0590 Thyrotoxicosis, unspecified without thyrotoxic crisis or storm: Secondary | ICD-10-CM

## 2012-02-18 ENCOUNTER — Encounter (HOSPITAL_COMMUNITY)
Admission: RE | Admit: 2012-02-18 | Discharge: 2012-02-18 | Disposition: A | Discharge status: Home / Self Care | Payer: Medicare Other | Source: Ambulatory Visit | Attending: "Endocrinology | Admitting: "Endocrinology

## 2012-02-18 ENCOUNTER — Encounter (HOSPITAL_COMMUNITY): Payer: Self-pay

## 2012-02-18 DIAGNOSIS — E0590 Thyrotoxicosis, unspecified without thyrotoxic crisis or storm: Secondary | ICD-10-CM | POA: Insufficient documentation

## 2012-02-18 HISTORY — DX: Essential (primary) hypertension: I10

## 2012-02-18 MED ORDER — SODIUM IODIDE I 131 CAPSULE
10.0000 | Freq: Once | INTRAVENOUS | Status: AC | PRN
  Administered 2012-02-18: 10 via ORAL

## 2012-02-19 ENCOUNTER — Encounter (HOSPITAL_COMMUNITY)
Admission: RE | Admit: 2012-02-19 | Discharge: 2012-02-19 | Disposition: A | Discharge status: Home / Self Care | Payer: Medicare Other | Source: Ambulatory Visit | Attending: "Endocrinology | Admitting: "Endocrinology

## 2012-02-19 MED ORDER — SODIUM PERTECHNETATE TC 99M INJECTION
10.0000 | Freq: Once | INTRAVENOUS | Status: AC | PRN
  Administered 2012-02-19: 10 via INTRAVENOUS

## 2012-05-28 ENCOUNTER — Encounter: Payer: Self-pay | Admitting: *Deleted

## 2012-06-21 ENCOUNTER — Other Ambulatory Visit: Payer: Self-pay | Admitting: Family Medicine

## 2012-09-13 ENCOUNTER — Other Ambulatory Visit: Payer: Self-pay | Admitting: Family Medicine

## 2012-11-18 ENCOUNTER — Ambulatory Visit (INDEPENDENT_AMBULATORY_CARE_PROVIDER_SITE_OTHER): Payer: Medicare Other | Admitting: *Deleted

## 2012-11-18 DIAGNOSIS — Z23 Encounter for immunization: Secondary | ICD-10-CM

## 2012-12-06 ENCOUNTER — Other Ambulatory Visit: Payer: Self-pay | Admitting: Family Medicine

## 2013-01-20 ENCOUNTER — Other Ambulatory Visit: Payer: Self-pay | Admitting: Family Medicine

## 2013-01-26 ENCOUNTER — Encounter: Payer: Self-pay | Admitting: Family Medicine

## 2013-01-26 ENCOUNTER — Ambulatory Visit (INDEPENDENT_AMBULATORY_CARE_PROVIDER_SITE_OTHER): Payer: Medicare Other | Admitting: Family Medicine

## 2013-01-26 VITALS — BP 108/62 | Temp 98.5°F | Ht 61.25 in | Wt 102.0 lb

## 2013-01-26 DIAGNOSIS — I10 Essential (primary) hypertension: Secondary | ICD-10-CM

## 2013-01-26 DIAGNOSIS — R05 Cough: Secondary | ICD-10-CM

## 2013-01-26 DIAGNOSIS — R059 Cough, unspecified: Secondary | ICD-10-CM

## 2013-01-26 DIAGNOSIS — R21 Rash and other nonspecific skin eruption: Secondary | ICD-10-CM

## 2013-01-26 DIAGNOSIS — K219 Gastro-esophageal reflux disease without esophagitis: Secondary | ICD-10-CM

## 2013-01-26 MED ORDER — BENAZEPRIL-HYDROCHLOROTHIAZIDE 20-12.5 MG PO TABS: 1.0000 | ORAL_TABLET | Freq: Every day | ORAL | Status: DC

## 2013-01-26 MED ORDER — LEVOFLOXACIN 500 MG PO TABS: 500.0000 mg | ORAL_TABLET | Freq: Every day | ORAL | Status: DC

## 2013-01-26 MED ORDER — ESOMEPRAZOLE MAGNESIUM 40 MG PO CPDR: 40.0000 mg | DELAYED_RELEASE_CAPSULE | Freq: Every day | ORAL | Status: DC

## 2013-01-26 MED ORDER — TRIAMCINOLONE ACETONIDE 0.1 % EX OINT: 1.0000 "application " | TOPICAL_OINTMENT | Freq: Two times a day (BID) | CUTANEOUS | Status: DC

## 2013-01-26 MED ORDER — CEFTRIAXONE SODIUM 1 G IJ SOLR
500.0000 mg | Freq: Once | INTRAMUSCULAR | Status: AC
  Administered 2013-01-26: 500 mg via INTRAMUSCULAR

## 2013-01-26 NOTE — Progress Notes (Signed)
   Subjective:    Patient ID: Jeffrey Mccarty, male    DOB: [birth year], 79 y.o.   MRN: [id number]  Cough This is a new problem. The current episode started in the past 7 days. Associated symptoms include headaches, myalgias, nasal congestion and wheezing.   Also notes long-standing history of hypertension. Compliant with medications. Trying to watch his salt intake.  Developed a rash on both hips. He's had this in the past. Irritated itchy at times.   Review of Systems  Respiratory: Positive for cough and wheezing.   Musculoskeletal: Positive for myalgias.  Neurological: Positive for headaches.   no other complaints     Objective:   Physical Exam  Alert hydration good. HEENT moderate nasal congestion pharynx some erythema neck supple. Lungs clear heart regular in rhythm. Lateral hips reveal discrete hypertrophic patch psoriasis-like in appearance      Assessment & Plan:  Impression bronchitis doubt pneumonia discussed patient request shot. #2 hypertension good control. #3 reflux decent control.. #4 psoriasis plan antibiotics prescribed. Injection per patient request. Maintain other meds. Triamcinolone ointment twice a day to scaly region. WSL

## 2013-01-31 ENCOUNTER — Encounter (HOSPITAL_COMMUNITY): Payer: Self-pay | Admitting: Emergency Medicine

## 2013-01-31 ENCOUNTER — Inpatient Hospital Stay (HOSPITAL_COMMUNITY)
Admission: EM | Admit: 2013-01-31 | Discharge: 2013-02-03 | DRG: 309 | Disposition: A | Discharge status: Home / Self Care | Payer: Medicare HMO | Attending: Internal Medicine | Admitting: Internal Medicine

## 2013-01-31 ENCOUNTER — Emergency Department (HOSPITAL_COMMUNITY): Payer: Medicare HMO

## 2013-01-31 DIAGNOSIS — Z8249 Family history of ischemic heart disease and other diseases of the circulatory system: Secondary | ICD-10-CM

## 2013-01-31 DIAGNOSIS — R0902 Hypoxemia: Secondary | ICD-10-CM | POA: Diagnosis present

## 2013-01-31 DIAGNOSIS — M353 Polymyalgia rheumatica: Secondary | ICD-10-CM | POA: Diagnosis present

## 2013-01-31 DIAGNOSIS — K219 Gastro-esophageal reflux disease without esophagitis: Secondary | ICD-10-CM

## 2013-01-31 DIAGNOSIS — K828 Other specified diseases of gallbladder: Secondary | ICD-10-CM

## 2013-01-31 DIAGNOSIS — J438 Other emphysema: Secondary | ICD-10-CM | POA: Diagnosis present

## 2013-01-31 DIAGNOSIS — R21 Rash and other nonspecific skin eruption: Secondary | ICD-10-CM

## 2013-01-31 DIAGNOSIS — Z8711 Personal history of peptic ulcer disease: Secondary | ICD-10-CM

## 2013-01-31 DIAGNOSIS — N400 Enlarged prostate without lower urinary tract symptoms: Secondary | ICD-10-CM | POA: Diagnosis present

## 2013-01-31 DIAGNOSIS — Z681 Body mass index (BMI) 19 or less, adult: Secondary | ICD-10-CM

## 2013-01-31 DIAGNOSIS — J069 Acute upper respiratory infection, unspecified: Secondary | ICD-10-CM

## 2013-01-31 DIAGNOSIS — H9190 Unspecified hearing loss, unspecified ear: Secondary | ICD-10-CM | POA: Diagnosis present

## 2013-01-31 DIAGNOSIS — E440 Moderate protein-calorie malnutrition: Secondary | ICD-10-CM | POA: Diagnosis present

## 2013-01-31 DIAGNOSIS — N179 Acute kidney failure, unspecified: Secondary | ICD-10-CM | POA: Diagnosis present

## 2013-01-31 DIAGNOSIS — N289 Disorder of kidney and ureter, unspecified: Secondary | ICD-10-CM

## 2013-01-31 DIAGNOSIS — E039 Hypothyroidism, unspecified: Secondary | ICD-10-CM | POA: Diagnosis present

## 2013-01-31 DIAGNOSIS — I4891 Unspecified atrial fibrillation: Principal | ICD-10-CM | POA: Diagnosis present

## 2013-01-31 DIAGNOSIS — Z87442 Personal history of urinary calculi: Secondary | ICD-10-CM

## 2013-01-31 DIAGNOSIS — E860 Dehydration: Secondary | ICD-10-CM

## 2013-01-31 DIAGNOSIS — I10 Essential (primary) hypertension: Secondary | ICD-10-CM | POA: Diagnosis present

## 2013-01-31 LAB — PROTIME-INR
INR: 1.32 (ref 0.00–1.49)
PROTHROMBIN TIME: 16.1 s — AB (ref 11.6–15.2)

## 2013-01-31 LAB — CBC WITH DIFFERENTIAL/PLATELET
Basophils Absolute: 0.1 10*3/uL (ref 0.0–0.1)
Basophils Relative: 1 % (ref 0–1)
EOS PCT: 3 % (ref 0–5)
Eosinophils Absolute: 0.2 10*3/uL (ref 0.0–0.7)
HCT: 33.4 % — ABNORMAL LOW (ref 39.0–52.0)
Hemoglobin: 11.7 g/dL — ABNORMAL LOW (ref 13.0–17.0)
LYMPHS ABS: 1 10*3/uL (ref 0.7–4.0)
LYMPHS PCT: 12 % (ref 12–46)
MCH: 30.7 pg (ref 26.0–34.0)
MCHC: 35 g/dL (ref 30.0–36.0)
MCV: 87.7 fL (ref 78.0–100.0)
Monocytes Absolute: 0.7 10*3/uL (ref 0.1–1.0)
Monocytes Relative: 8 % (ref 3–12)
NEUTROS PCT: 76 % (ref 43–77)
Neutro Abs: 6.3 10*3/uL (ref 1.7–7.7)
PLATELETS: 298 10*3/uL (ref 150–400)
RBC: 3.81 MIL/uL — AB (ref 4.22–5.81)
RDW: 13.5 % (ref 11.5–15.5)
WBC: 8.2 10*3/uL (ref 4.0–10.5)

## 2013-01-31 LAB — COMPREHENSIVE METABOLIC PANEL
ALK PHOS: 48 U/L (ref 39–117)
ALT: 10 U/L (ref 0–53)
AST: 23 U/L (ref 0–37)
Albumin: 3.3 g/dL — ABNORMAL LOW (ref 3.5–5.2)
BILIRUBIN TOTAL: 0.3 mg/dL (ref 0.3–1.2)
BUN: 51 mg/dL — ABNORMAL HIGH (ref 6–23)
CO2: 21 meq/L (ref 19–32)
Calcium: 8.9 mg/dL (ref 8.4–10.5)
Chloride: 98 mEq/L (ref 96–112)
Creatinine, Ser: 2.14 mg/dL — ABNORMAL HIGH (ref 0.50–1.35)
GFR, EST AFRICAN AMERICAN: 30 mL/min — AB (ref >90)
GFR, EST NON AFRICAN AMERICAN: 26 mL/min — AB (ref >90)
GLUCOSE: 101 mg/dL — AB (ref 70–99)
POTASSIUM: 4.3 meq/L (ref 3.7–5.3)
SODIUM: 134 meq/L — AB (ref 137–147)
Total Protein: 7.1 g/dL (ref 6.0–8.3)

## 2013-01-31 LAB — URINALYSIS, ROUTINE W REFLEX MICROSCOPIC
BILIRUBIN URINE: NEGATIVE
Glucose, UA: NEGATIVE mg/dL
HGB URINE DIPSTICK: NEGATIVE
Ketones, ur: NEGATIVE mg/dL
Leukocytes, UA: NEGATIVE
Nitrite: NEGATIVE
PROTEIN: 30 mg/dL — AB
Specific Gravity, Urine: 1.02 (ref 1.005–1.030)
UROBILINOGEN UA: 0.2 mg/dL (ref 0.0–1.0)
pH: 5.5 (ref 5.0–8.0)

## 2013-01-31 LAB — TROPONIN I: Troponin I: <0.3 ng/mL (ref <0.30)

## 2013-01-31 LAB — URINE MICROSCOPIC-ADD ON

## 2013-01-31 LAB — LIPASE, BLOOD: Lipase: 28 U/L (ref 11–59)

## 2013-01-31 LAB — APTT: APTT: 46 s — AB (ref 24–37)

## 2013-01-31 LAB — PRO B NATRIURETIC PEPTIDE: Pro B Natriuretic peptide (BNP): 3826 pg/mL — ABNORMAL HIGH (ref 0–450)

## 2013-01-31 MED ORDER — PANTOPRAZOLE SODIUM 40 MG IV SOLR
40.0000 mg | Freq: Once | INTRAVENOUS | Status: AC
  Administered 2013-02-01: 40 mg via INTRAVENOUS
  Filled 2013-01-31: qty 40

## 2013-01-31 MED ORDER — ACETAMINOPHEN 325 MG PO TABS: 650.0000 mg | ORAL_TABLET | Freq: Four times a day (QID) | ORAL | Status: DC | PRN

## 2013-01-31 MED ORDER — SODIUM CHLORIDE 0.9 % IV BOLUS (SEPSIS)
700.0000 mL | Freq: Once | INTRAVENOUS | Status: AC
  Administered 2013-01-31: 700 mL via INTRAVENOUS

## 2013-01-31 MED ORDER — GUAIFENESIN ER 600 MG PO TB12
600.0000 mg | ORAL_TABLET | Freq: Two times a day (BID) | ORAL | Status: DC
  Administered 2013-02-01 – 2013-02-03 (×6): 600 mg via ORAL
  Filled 2013-01-31 (×6): qty 1

## 2013-01-31 MED ORDER — METOPROLOL TARTRATE 25 MG PO TABS
12.5000 mg | ORAL_TABLET | Freq: Two times a day (BID) | ORAL | Status: DC
  Administered 2013-02-01: 12.5 mg via ORAL
  Filled 2013-01-31 (×2): qty 1

## 2013-01-31 MED ORDER — LEVALBUTEROL HCL 0.63 MG/3ML IN NEBU: 0.6300 mg | INHALATION_SOLUTION | Freq: Four times a day (QID) | RESPIRATORY_TRACT | Status: DC | PRN

## 2013-01-31 MED ORDER — MORPHINE SULFATE 2 MG/ML IJ SOLN
2.0000 mg | Freq: Once | INTRAMUSCULAR | Status: AC
  Administered 2013-01-31: 2 mg via INTRAVENOUS
  Filled 2013-01-31: qty 1

## 2013-01-31 MED ORDER — PANTOPRAZOLE SODIUM 40 MG PO TBEC: 40.0000 mg | DELAYED_RELEASE_TABLET | Freq: Every day | ORAL | Status: DC

## 2013-01-31 MED ORDER — ACETAMINOPHEN 650 MG RE SUPP: 650.0000 mg | Freq: Four times a day (QID) | RECTAL | Status: DC | PRN

## 2013-01-31 MED ORDER — LEVOFLOXACIN IN D5W 500 MG/100ML IV SOLN
INTRAVENOUS | Status: AC
  Filled 2013-01-31: qty 100

## 2013-01-31 MED ORDER — ONDANSETRON HCL 4 MG/2ML IJ SOLN: 4.0000 mg | Freq: Four times a day (QID) | INTRAMUSCULAR | Status: DC | PRN

## 2013-01-31 MED ORDER — LEVOFLOXACIN IN D5W 500 MG/100ML IV SOLN
500.0000 mg | INTRAVENOUS | Status: DC
  Administered 2013-02-01: 500 mg via INTRAVENOUS
  Filled 2013-01-31 (×2): qty 100

## 2013-01-31 MED ORDER — OXYCODONE HCL 5 MG PO TABS: 5.0000 mg | ORAL_TABLET | ORAL | Status: DC | PRN

## 2013-01-31 MED ORDER — ONDANSETRON HCL 4 MG PO TABS: 4.0000 mg | ORAL_TABLET | Freq: Four times a day (QID) | ORAL | Status: DC | PRN

## 2013-01-31 MED ORDER — ASPIRIN EC 325 MG PO TBEC
325.0000 mg | DELAYED_RELEASE_TABLET | Freq: Every day | ORAL | Status: DC
  Filled 2013-01-31: qty 1

## 2013-01-31 MED ORDER — SODIUM CHLORIDE 0.9 % IJ SOLN
INTRAMUSCULAR | Status: AC
  Filled 2013-01-31: qty 500

## 2013-01-31 MED ORDER — ENOXAPARIN SODIUM 60 MG/0.6ML ~~LOC~~ SOLN
1.0000 mg/kg | Freq: Once | SUBCUTANEOUS | Status: AC
  Administered 2013-02-01: 45 mg via SUBCUTANEOUS
  Filled 2013-01-31: qty 0.6

## 2013-01-31 MED ORDER — ONDANSETRON HCL 4 MG/2ML IJ SOLN
4.0000 mg | Freq: Once | INTRAMUSCULAR | Status: DC
  Filled 2013-01-31: qty 2

## 2013-01-31 MED ORDER — DILTIAZEM HCL 25 MG/5ML IV SOLN
5.0000 mg | Freq: Once | INTRAVENOUS | Status: DC
  Filled 2013-01-31: qty 5

## 2013-01-31 MED ORDER — SODIUM CHLORIDE 0.9 % IJ SOLN
3.0000 mL | Freq: Two times a day (BID) | INTRAMUSCULAR | Status: DC
  Administered 2013-02-01 – 2013-02-03 (×3): 3 mL via INTRAVENOUS

## 2013-01-31 MED ORDER — IOHEXOL 300 MG/ML  SOLN
50.0000 mL | Freq: Once | INTRAMUSCULAR | Status: AC | PRN
  Administered 2013-01-31: 50 mL via ORAL

## 2013-01-31 MED ORDER — TRIAMCINOLONE ACETONIDE 0.1 % EX OINT
TOPICAL_OINTMENT | CUTANEOUS | Status: AC
  Filled 2013-01-31: qty 15

## 2013-01-31 MED ORDER — SODIUM CHLORIDE 0.9 % IV SOLN
INTRAVENOUS | Status: DC
  Administered 2013-01-31 – 2013-02-01 (×2): via INTRAVENOUS

## 2013-01-31 MED ORDER — TRIAMCINOLONE ACETONIDE 0.1 % EX OINT
1.0000 "application " | TOPICAL_OINTMENT | Freq: Two times a day (BID) | CUTANEOUS | Status: DC
  Administered 2013-02-01 – 2013-02-03 (×6): 1 via TOPICAL
  Filled 2013-01-31: qty 15

## 2013-01-31 MED ORDER — ONDANSETRON HCL 4 MG/2ML IJ SOLN
4.0000 mg | Freq: Once | INTRAMUSCULAR | Status: AC
  Administered 2013-01-31: 4 mg via INTRAVENOUS

## 2013-01-31 NOTE — ED Notes (Signed)
Per daughter pt c/o burning abd pain (denies v/d) and trouble sleeping x 3 days. Pt currently taking levofloxacin for uri.

## 2013-01-31 NOTE — ED Notes (Signed)
Pt back in bed on monitor, cleaned up, had loose BM, daughters at the bedside.

## 2013-01-31 NOTE — H&P (Signed)
[facility] History and Physical  Jeffrey Mccarty OVZ:[id number] DOB: Apr 16, 1921 DOA: 01/31/2013   PCP: Rubbie Battiest, MD  Specialists: He has been followed by Dr. Wilson Singer with Endocrinology for thyroid disease.  Chief Complaint: Cold like symptoms, and abdominal pain  HPI: Jeffrey Mccarty is a 80 y.o. male with a past medical history of hypertension, GERD, status post esophageal dilatation, an unknown thyroid disease, for which he doesn't take any medications any more, who was in his usual state of health last week when he started having cold like symptoms. He had a cough, congestion in his chest. He went to see his primary care physician on Monday and was prescribed Levaquin. He has been taking that medication daily and then he started having burning sensation in his stomach which started bothering him and so, he called his daughter and he was brought into the hospital. Patient is extremely hard of hearing. So history was obtained mostly from his daughters. He denies any chest pain, however, he has been having some difficulty breathing. Has noticed palpitations. Denies any dizziness. He, says that whenever he coughs he feels pain under his rib cage. He also has some burning sensation in his stomach. But that has apparently improved. Still has a cough with yellowish expectoration. He did get a flu shot this season. He has had difficulty urination for a while. And, ever since he started taking the Levaquin he has had some difficulty sleeping as well. But he denies any diarrhea at home. He did have a bowel movement in the ED after he was given oral contrast. Daughter does mention a weight loss of about 15 pounds over the last many months.  Home Medications: Prior to Admission medications   Medication Sig Start Date End Date Taking? Authorizing Provider  benazepril-hydrochlorthiazide (LOTENSIN HCT) 20-12.5 MG per tablet Take 1 tablet by mouth daily. 01/26/13  Yes Mikey Kirschner, MD  esomeprazole (NEXIUM)  40 MG capsule Take 1 capsule (40 mg total) by mouth daily. 01/26/13  Yes Mikey Kirschner, MD  levofloxacin (LEVAQUIN) 500 MG tablet Take 1 tablet (500 mg total) by mouth daily. 01/26/13  Yes Mikey Kirschner, MD  triamcinolone ointment (KENALOG) 0.1 % Apply 1 application topically 2 (two) times daily. 01/26/13  Yes Mikey Kirschner, MD    Allergies:  Allergies  Allergen Reactions  . Cardura [Doxazosin Mesylate]     Past Medical History: Past Medical History  Diagnosis Date  . Hypertension   . Hypothyroid   . Reflux   . Kidney stone   . Polymyalgia rheumatica   . Insomnia   . Pressure ulcer   . Elevated PSA     Past Surgical History  Procedure Laterality Date  . Appendectomy    . Cholecystectomy    . Thyroidectomy    . Tonsillectomy      Social History: He lives by himself. According to the daughter. He is still very independent. He mows his own lawn. He hasn't had any history of smoking, alcohol use illicit drug use.  Family History:  Family History  Problem Relation Age of Onset  . Heart attack Father      Review of Systems - unable to do due to hearing impairment  Physical Examination  Filed Vitals:   01/31/13 1710 01/31/13 1900 01/31/13 1943 01/31/13 2028  BP:  99/57 104/55 103/50  Pulse:  109 112 108  Temp:      TempSrc:   Oral   Resp:  23 24  Height:      Weight:      SpO2: 92% 92% 97% 98%    General appearance: alert, cooperative, appears stated age and no distress Head: Normocephalic, without obvious abnormality, atraumatic Eyes: conjunctivae/corneas clear. PERRL, EOM's intact.  Throat: lips, mucosa, and tongue normal; teeth and gums normal Neck: no adenopathy, no carotid bruit, no JVD, supple, symmetrical, trachea midline and thyroid not enlarged, symmetric, no tenderness/mass/nodules Resp: Coarse breath sounds bilaterally, but diminished air entry at the bases without any definite crackles or wheezing Cardio: S1-S2 is Irregularly irregular,  tachycardic. No S3, S4. No rubs, murmurs, or bruit. No pedal edema. GI: Abdomen is tender in the epigastric area without any rebound, rigidity, or guarding. No masses, organomegaly. Bowel sounds are present. Extremities: extremities normal, atraumatic, no cyanosis or edema Pulses: 2+ and symmetric Skin: Skin color, texture, turgor normal. No rashes or lesions Lymph nodes: Cervical, supraclavicular, and axillary nodes normal. Neurologic: Hard of hearing. No other focal deficits appreciated.   Laboratory Data: Results for orders placed during the hospital encounter of 01/31/13 (from the past 48 hour(s))  TROPONIN I     Status: None   Collection Time    01/31/13  3:37 PM      Result Value Range   Troponin I <0.30  <0.30 ng/mL   Comment:            Due to the release kinetics of cTnI,     a negative result within the first hours     of the onset of symptoms does not rule out     myocardial infarction with certainty.     If myocardial infarction is still suspected,     repeat the test at appropriate intervals.  APTT     Status: Abnormal   Collection Time    01/31/13  3:37 PM      Result Value Range   aPTT 46 (*) 24 - 37 seconds   Comment:            IF BASELINE aPTT IS ELEVATED,     SUGGEST PATIENT RISK ASSESSMENT     BE USED TO DETERMINE APPROPRIATE     ANTICOAGULANT THERAPY.  PROTIME-INR     Status: Abnormal   Collection Time    01/31/13  3:37 PM      Result Value Range   Prothrombin Time 16.1 (*) 11.6 - 15.2 seconds   INR 1.32  0.00 - 1.49  PRO B NATRIURETIC PEPTIDE     Status: Abnormal   Collection Time    01/31/13  3:37 PM      Result Value Range   Pro B Natriuretic peptide (BNP) 3826.0 (*) 0 - 450 pg/mL  CBC WITH DIFFERENTIAL     Status: Abnormal   Collection Time    01/31/13  3:38 PM      Result Value Range   WBC 8.2  4.0 - 10.5 K/uL   RBC 3.81 (*) 4.22 - 5.81 MIL/uL   Hemoglobin 11.7 (*) 13.0 - 17.0 g/dL   HCT 33.4 (*) 39.0 - 52.0 %   MCV 87.7  78.0 - 100.0 fL    MCH 30.7  26.0 - 34.0 pg   MCHC 35.0  30.0 - 36.0 g/dL   RDW 13.5  11.5 - 15.5 %   Platelets 298  150 - 400 K/uL   Neutrophils Relative % 76  43 - 77 %   Neutro Abs 6.3  1.7 - 7.7 K/uL   Lymphocytes Relative 12  12 - 46 %   Lymphs Abs 1.0  0.7 - 4.0 K/uL   Monocytes Relative 8  3 - 12 %   Monocytes Absolute 0.7  0.1 - 1.0 K/uL   Eosinophils Relative 3  0 - 5 %   Eosinophils Absolute 0.2  0.0 - 0.7 K/uL   Basophils Relative 1  0 - 1 %   Basophils Absolute 0.1  0.0 - 0.1 K/uL  COMPREHENSIVE METABOLIC PANEL     Status: Abnormal   Collection Time    01/31/13  3:38 PM      Result Value Range   Sodium 134 (*) 137 - 147 mEq/L   Comment: Please note change in reference range.   Potassium 4.3  3.7 - 5.3 mEq/L   Comment: Please note change in reference range.   Chloride 98  96 - 112 mEq/L   CO2 21  19 - 32 mEq/L   Glucose, Bld 101 (*) 70 - 99 mg/dL   BUN 51 (*) 6 - 23 mg/dL   Creatinine, Ser 2.14 (*) 0.50 - 1.35 mg/dL   Calcium 8.9  8.4 - 10.5 mg/dL   Total Protein 7.1  6.0 - 8.3 g/dL   Albumin 3.3 (*) 3.5 - 5.2 g/dL   AST 23  0 - 37 U/L   ALT 10  0 - 53 U/L   Alkaline Phosphatase 48  39 - 117 U/L   Total Bilirubin 0.3  0.3 - 1.2 mg/dL   GFR calc non Af Amer 26 (*) >90 mL/min   GFR calc Af Amer 30 (*) >90 mL/min   Comment: (NOTE)     The eGFR has been calculated using the CKD EPI equation.     This calculation has not been validated in all clinical situations.     eGFR's persistently <90 mL/min signify possible Chronic Kidney     Disease.  LIPASE, BLOOD     Status: None   Collection Time    01/31/13  3:38 PM      Result Value Range   Lipase 28  11 - 59 U/L  URINALYSIS, ROUTINE W REFLEX MICROSCOPIC     Status: Abnormal   Collection Time    01/31/13  4:20 PM      Result Value Range   Color, Urine YELLOW  YELLOW   APPearance HAZY (*) CLEAR   Specific Gravity, Urine 1.020  1.005 - 1.030   pH 5.5  5.0 - 8.0   Glucose, UA NEGATIVE  NEGATIVE mg/dL   Hgb urine dipstick NEGATIVE   NEGATIVE   Bilirubin Urine NEGATIVE  NEGATIVE   Ketones, ur NEGATIVE  NEGATIVE mg/dL   Protein, ur 30 (*) NEGATIVE mg/dL   Urobilinogen, UA 0.2  0.0 - 1.0 mg/dL   Nitrite NEGATIVE  NEGATIVE   Leukocytes, UA NEGATIVE  NEGATIVE  URINE MICROSCOPIC-ADD ON     Status: Abnormal   Collection Time    01/31/13  4:20 PM      Result Value Range   WBC, UA 0-2  <3 WBC/hpf   RBC / HPF 0-2  <3 RBC/hpf   Bacteria, UA FEW (*) RARE    Radiology Reports: Ct Abdomen Pelvis Wo Contrast  01/31/2013   CLINICAL DATA:  Right-sided abdominal pain. Urolithiasis. Previous appendectomy and cholecystectomy.  EXAM: CT ABDOMEN AND PELVIS WITHOUT CONTRAST  TECHNIQUE: Multidetector CT imaging of the abdomen and pelvis was performed following the standard protocol without intravenous contrast.  COMPARISON:  03/06/2007  FINDINGS: Small to moderate hiatal hernia  again seen. Surgical clips from prior cholecystectomy noted. Tiny sub-cm low-attenuation lesion in the anterior left hepatic lobe is stable and consistent with tiny benign cyst. No other liver lesions identified in this noncontrast study. The pancreas, spleen, and adrenal glands are normal in appearance. Renal cysts are noted bilaterally however there is no evidence of renal calculi or hydronephrosis. No evidence of ureteral calculi or dilatation.  Moderately enlarged prostate gland is again seen with mass effect on bladder base. Several bladder diverticula again seen, consistent with chronic bladder outlet obstruction. No bladder calculi identified.  No evidence of abdominal or pelvic lymphadenopathy. No evidence of acute inflammatory process or abnormal fluid collections. No evidence of dilated bowel loops or bowel wall thickening. An old L1 vertebral body compression fracture is noted, which is new since 2009 exam.  IMPRESSION: No evidence of urolithiasis, hydronephrosis, or other acute findings.  Small to moderate hiatal hernia.  Moderately enlarged prostate, with findings  of chronic bladder outlet obstruction.   Electronically Signed   By: Earle Gell M.D.   On: 01/31/2013 17:49   Dg Chest 2 View  01/31/2013   CLINICAL DATA:  Cough congestion in shortness of breath  EXAM: CHEST  2 VIEW  COMPARISON:  Chest radiograph 04/17/2007 and CT abdomen and pelvis 01/31/2013. CT abdomen pelvis 2009.  FINDINGS: The heart is mildly enlarged. Aorta slightly tortuous. Hilar contours within normal limits. Patient is kyphotic and the lungs are markedly hyperinflated, with flattening of the diaphragms. There is slight peribronchial thickening at the lung bases. No focal airspace disease or pulmonary edema. Negative for pneumothorax or pleural effusion. There is gaseous distention of the esophagus.  There is a severe compression deformity of the L1 vertebral body. This appears new compared to CT abdomen pelvis of 2009.  IMPRESSION: 1. Marked hyperinflation with peribronchial thickening at the lung bases. Findings likely reflect COPD. Superimposed acute bronchitis not excluded. 2. Gaseous distention of the esophagus. This can be seen in the setting of gastroesophageal reflux, esophageal dysmotility, or distal esophageal narrowing. 3. Cardiomegaly. 4. Severe L1 compression fracture.   Electronically Signed   By: Curlene Dolphin M.D.   On: 01/31/2013 17:54    Electrocardiogram: Atrial fibrillation at 115 beats per minute. Normal axis. Intervals are normal. No definite Q waves. PVCs are noted. No ischemic changes appreciated.  Problem List  Principal Problem:   Atrial fibrillation with RVR Active Problems:   HYPERTENSION   URTI (acute upper respiratory infection)   ARF (acute renal failure)   Assessment: This is an 80 year old, Caucasian male, who presents with abdominal discomfort. He was noted to have atrial fibrillation with RVR in the emergency department. His symptoms started out with the URTI last week. He continues to be on Levaquin. He was mildly hypoxic when he presented to the  hospital. He doesn't have any wheezing at this time. Chest x-ray suggested COPD, but there is no history of smoking.  Plan: #1 upper respiratory tract infection with mild hypoxia: Since he does have new A. Fib and has vague discomfort in lower chest and abdomen, we need to consider venous thromboembolism in our differential. I will proceed with a VQ scan at this time. We'll continue with his Levaquin for now. Since he is not wheezing hold off on nebulizer treatments.  #2 new onset atrial fibrillation: Most likely due to his respiratory disease. He also appears to be dehydrated. He'll be given small dose of Cardizem intravenously to control his heart rate. He'll be then given oral doses  of metoprolol at low dose as well. He'll be monitored on telemetry. Since he does have a history of unknown thyroid disease we will check his TSH and free T4 levels. Will check echocardiogram. CHADS2 score is 2. He would benefit from anticoagulation. This can be discussed after echocardiogram results are available. Cardiomegaly is noted on chest x-ray and he does have elevated BNP, but that doesn't appear to be in congestive heart failure, clinically. He actually appears to be dehydrated.  #3 abdominal discomfort: Probably related to the antibiotic. He does have a history of GERD. His lipase is normal as is his LFTs. Continue PPI. CT was also unremarkable. He mentions discomfort with urination, but his UA does not suggest infection. He is noted to have enlarged prostate on the CT, which could be contributing.  #4 unspecified thyroid disease: Check thyroid function tests as discussed above.  #5 history of hypertension: Monitor blood pressures closely. Hold his antihypertensive agent, as he will be initiated on metoprolol for heart rate control.  #6 acute renal failure: No labs are available. We will give him IV fluids. Monitor urine output closely and then recheck his renal function in the morning.  #7 enlarged prostate  noted on CT, which appears to be causing some bladder outlet obstruction: This appears to be causing his symptoms. He will need urological evaluation, as an outpatient.  #8 history of GERD with esophageal dilation in the past: Gaseous distention of esophagus was noted on x-ray. We'll continue with PPI. He doesn't have any symptoms dysphagia. Continue to monitor for now. He can follow up with his gastroenterologist as an outpatient.  #9 weight loss: I have told the daughters to discuss this issue further with his PCP.   DVT Prophylaxis: One dose of therapeutic Lovenox tonight till VQ can be done in AM. Code Status: Full code Family Communication: Discussed with the daughters  Disposition Plan: Admit to telemetry   Further management decisions will depend on results of further testing and patient's response to treatment.  [facility]  [facility] Pager [phone]  If 7PM-7AM, please contact night-coverage [email or web address] Password [facility]  01/31/2013, 9:24 PM

## 2013-01-31 NOTE — ED Notes (Signed)
Daughter state pt has 2 ulcer on back, pcp gave cream for, states it was psoriasis.

## 2013-01-31 NOTE — ED Notes (Signed)
Pt back in bathroom with diarrhea, daughters to assist

## 2013-01-31 NOTE — ED Provider Notes (Signed)
CSN: [id number]     Arrival date & time 01/31/13  1324 History   First MD Initiated Contact with Patient 01/31/13 1502 This chart was scribed for Ward Givens, MD by Valera Castle, ED Scribe. This patient was seen in room APA01/APA01 and the patient's care was started at 3:10 PM.     Chief Complaint  Patient presents with  . Abdominal Pain    The history is provided by the patient and a relative.   HPI Comments: Jeffrey Mccarty is a 80 y.o. male brought in by a relative, with h/o decreased hearing who presents to the Emergency Department complaining of burning like "It is on fire", constant, right sided abdominal pain, with associated trouble sleeping, onset 2-3 days ago after taking antibiotics. He reports lingering cough, and that coughing exacerbates his abdominal pain. He denies h/o similar abdominal pain. Relative states he saw Dr. Gerda Diss 5 days ago for cold symptoms, and received antibiotic injection and Rx for Levaquin. She reports he has no h/o taking it before. She reports he usually does not complain about his other medications, but began complaining about his abdominal pain after starting antibiotic. She reports he sleeps a lot, and stays by himself. She states his cold symptoms have been improving, however he still has a cough. She states he has been eating and drinking, but not as much as usual.  She denies him having fever, vomiting, diarrhea and any other associated symptoms.  Nothing makes the pain feel worse, nothing makes the pain better. He has never had this pain before. She denies him having h/o smoking, EtOH use.   PCP - Harlow Asa, MD  Past Medical History  Diagnosis Date  . Hypertension   . Hypothyroid   . Reflux   . Kidney stone   . Polymyalgia rheumatica   . Insomnia   . Pressure ulcer   . Elevated PSA    Past Surgical History  Procedure Laterality Date  . Appendectomy    . Cholecystectomy    . Thyroidectomy    . Tonsillectomy     Family History  Problem  Relation Age of Onset  . Heart attack Father    History  Substance Use Topics  . Smoking status: Never Smoker   . Smokeless tobacco: Not on file  . Alcohol Use: Not on file   lives at home Lives alone Widowed 20 years Does not drink Patient very hard of hearing  Review of Systems  Constitutional: Negative for fever.  Respiratory: Positive for cough.   Gastrointestinal: Positive for abdominal pain.  All other systems reviewed and are negative.    Allergies  Cardura  Home Medications   Current Outpatient Rx  Name  Route  Sig  Dispense  Refill  . benazepril-hydrochlorthiazide (LOTENSIN HCT) 20-12.5 MG per tablet   Oral   Take 1 tablet by mouth daily.   90 tablet   2   . esomeprazole (NEXIUM) 40 MG capsule   Oral   Take 1 capsule (40 mg total) by mouth daily.   90 capsule   2   . levofloxacin (LEVAQUIN) 500 MG tablet   Oral   Take 1 tablet (500 mg total) by mouth daily.   10 tablet   0   . triamcinolone ointment (KENALOG) 0.1 %   Topical   Apply 1 application topically 2 (two) times daily.   60 g   3    BP 97/60  Pulse 116  Temp(Src) 97.4 F (36.3 C) (Oral)  Resp 20  Ht 5\' 2"  (1.575 m)  Wt 109 lb (49.442 kg)  BMI 19.93 kg/m2  SpO2 99%  Vital signs normal except for tachycardia and borderline hypertension   Physical Exam  Nursing note and vitals reviewed. Constitutional: He is oriented to person, place, and time.  Non-toxic appearance. He does not appear ill. No distress.  Frail elderly.  HENT:  Head: Normocephalic and atraumatic.  Right Ear: External ear normal.  Left Ear: External ear normal.  Nose: Nose normal. No mucosal edema or rhinorrhea.  Mouth/Throat: Oropharynx is clear and moist and mucous membranes are normal. No dental abscesses or uvula swelling.  Eyes: Conjunctivae and EOM are normal. Pupils are equal, round, and reactive to light.  Neck: Normal range of motion and full passive range of motion without pain. Neck supple.   Cardiovascular: Regular rhythm and normal heart sounds.  Tachycardia present.  Exam reveals no gallop and no friction rub.   No murmur heard. Pulmonary/Chest: Effort normal. No respiratory distress. He has no wheezes. He has no rhonchi. He has no rales. He exhibits no tenderness and no crepitus.  Course breath sounds. Deep rattling sound upon coughing.   Abdominal: Soft. Normal appearance and bowel sounds are normal. He exhibits no distension. There is tenderness (Tenderness diffusely over right abdomen. Most painful in RUQ.). There is no rebound and no guarding.  Musculoskeletal: Normal range of motion. He exhibits no edema and no tenderness.  Moves all extremities well.   Neurological: He is alert and oriented to person, place, and time. He has normal strength. No cranial nerve deficit.  Skin: Skin is warm, dry and intact. No rash noted. No erythema. No pallor.  Psychiatric: He has a normal mood and affect. His speech is normal and behavior is normal. His mood appears not anxious.    ED Course  Procedures (including critical care time)  Medications  0.9 %  sodium chloride infusion (not administered)  sodium chloride 0.9 % injection (not administered)  sodium chloride 0.9 % bolus 700 mL (700 mLs Intravenous New Bag/Given 01/31/13 1622)  morphine 2 MG/ML injection 2 mg (2 mg Intravenous Given 01/31/13 1621)  iohexol (OMNIPAQUE) 300 MG/ML solution 50 mL (50 mLs Oral Contrast Given 01/31/13 1543)  ondansetron (ZOFRAN) injection 4 mg (4 mg Intravenous Given 01/31/13 1621)    DIAGNOSTIC STUDIES: Oxygen Saturation is 99% on room air, normal by my interpretation.    COORDINATION OF CARE: 3:19 PM-Discussed treatment plan which includes pain medication, CXR, CT abdomen, CBC, CMP, Lipase, and UA with pt at bedside and pt agreed to plan.   7:24 PM - Discussed lab results with pt, including increased kidney function and dehydration. Discussed pt's a-fib. Pt denies prior h/o a-fib. Discussed pt will need  medication for a-fib. Advised pt he may need to stay overnight for further evaluation. Patient reported his pain was gone.  19:45 Dr Rito Ehrlich, wants to see if his rate can be controlled with diltiazem bolus, but hold on the drip for now then can go to tele bed.    Labs Review Results for orders placed during the hospital encounter of 01/31/13  CBC WITH DIFFERENTIAL      Result Value Range   WBC 8.2  4.0 - 10.5 K/uL   RBC 3.81 (*) 4.22 - 5.81 MIL/uL   Hemoglobin 11.7 (*) 13.0 - 17.0 g/dL   HCT 16.1 (*) 09.6 - 04.5 %   MCV 87.7  78.0 - 100.0 fL   MCH 30.7  26.0 - 34.0  pg   MCHC 35.0  30.0 - 36.0 g/dL   RDW 44.0  34.7 - 42.5 %   Platelets 298  150 - 400 K/uL   Neutrophils Relative % 76  43 - 77 %   Neutro Abs 6.3  1.7 - 7.7 K/uL   Lymphocytes Relative 12  12 - 46 %   Lymphs Abs 1.0  0.7 - 4.0 K/uL   Monocytes Relative 8  3 - 12 %   Monocytes Absolute 0.7  0.1 - 1.0 K/uL   Eosinophils Relative 3  0 - 5 %   Eosinophils Absolute 0.2  0.0 - 0.7 K/uL   Basophils Relative 1  0 - 1 %   Basophils Absolute 0.1  0.0 - 0.1 K/uL  COMPREHENSIVE METABOLIC PANEL      Result Value Range   Sodium 134 (*) 137 - 147 mEq/L   Potassium 4.3  3.7 - 5.3 mEq/L   Chloride 98  96 - 112 mEq/L   CO2 21  19 - 32 mEq/L   Glucose, Bld 101 (*) 70 - 99 mg/dL   BUN 51 (*) 6 - 23 mg/dL   Creatinine, Ser 9.56 (*) 0.50 - 1.35 mg/dL   Calcium 8.9  8.4 - 38.7 mg/dL   Total Protein 7.1  6.0 - 8.3 g/dL   Albumin 3.3 (*) 3.5 - 5.2 g/dL   AST 23  0 - 37 U/L   ALT 10  0 - 53 U/L   Alkaline Phosphatase 48  39 - 117 U/L   Total Bilirubin 0.3  0.3 - 1.2 mg/dL   GFR calc non Af Amer 26 (*) >90 mL/min   GFR calc Af Amer 30 (*) >90 mL/min  LIPASE, BLOOD      Result Value Range   Lipase 28  11 - 59 U/L  URINALYSIS, ROUTINE W REFLEX MICROSCOPIC      Result Value Range   Color, Urine YELLOW  YELLOW   APPearance HAZY (*) CLEAR   Specific Gravity, Urine 1.020  1.005 - 1.030   pH 5.5  5.0 - 8.0   Glucose, UA NEGATIVE   NEGATIVE mg/dL   Hgb urine dipstick NEGATIVE  NEGATIVE   Bilirubin Urine NEGATIVE  NEGATIVE   Ketones, ur NEGATIVE  NEGATIVE mg/dL   Protein, ur 30 (*) NEGATIVE mg/dL   Urobilinogen, UA 0.2  0.0 - 1.0 mg/dL   Nitrite NEGATIVE  NEGATIVE   Leukocytes, UA NEGATIVE  NEGATIVE  URINE MICROSCOPIC-ADD ON      Result Value Range   WBC, UA 0-2  <3 WBC/hpf   RBC / HPF 0-2  <3 RBC/hpf   Bacteria, UA FEW (*) RARE  TROPONIN I      Result Value Range   Troponin I <0.30  <0.30 ng/mL  APTT      Result Value Range   aPTT 46 (*) 24 - 37 seconds  PROTIME-INR      Result Value Range   Prothrombin Time 16.1 (*) 11.6 - 15.2 seconds   INR 1.32  0.00 - 1.49  PRO B NATRIURETIC PEPTIDE      Result Value Range   Pro B Natriuretic peptide (BNP) 3826.0 (*) 0 - 450 pg/mL   Laboratory interpretation all normal except new renal insufficiency, mild anemia, elevated BNP    Imaging Review Ct Abdomen Pelvis Wo Contrast  01/31/2013   CLINICAL DATA:  Right-sided abdominal pain. Urolithiasis. Previous appendectomy and cholecystectomy.  EXAM: CT ABDOMEN AND PELVIS WITHOUT CONTRAST  TECHNIQUE: Multidetector CT  imaging of the abdomen and pelvis was performed following the standard protocol without intravenous contrast.  COMPARISON:  03/06/2007  FINDINGS: Small to moderate hiatal hernia again seen. Surgical clips from prior cholecystectomy noted. Tiny sub-cm low-attenuation lesion in the anterior left hepatic lobe is stable and consistent with tiny benign cyst. No other liver lesions identified in this noncontrast study. The pancreas, spleen, and adrenal glands are normal in appearance. Renal cysts are noted bilaterally however there is no evidence of renal calculi or hydronephrosis. No evidence of ureteral calculi or dilatation.  Moderately enlarged prostate gland is again seen with mass effect on bladder base. Several bladder diverticula again seen, consistent with chronic bladder outlet obstruction. No bladder calculi  identified.  No evidence of abdominal or pelvic lymphadenopathy. No evidence of acute inflammatory process or abnormal fluid collections. No evidence of dilated bowel loops or bowel wall thickening. An old L1 vertebral body compression fracture is noted, which is new since 2009 exam.  IMPRESSION: No evidence of urolithiasis, hydronephrosis, or other acute findings.  Small to moderate hiatal hernia.  Moderately enlarged prostate, with findings of chronic bladder outlet obstruction.   Electronically Signed   By: Myles Rosenthal M.D.   On: 01/31/2013 17:49   Dg Chest 2 View  01/31/2013   CLINICAL DATA:  Cough congestion in shortness of breath  EXAM: CHEST  2 VIEW  COMPARISON:  Chest radiograph 04/17/2007 and CT abdomen and pelvis 01/31/2013. CT abdomen pelvis 2009.  FINDINGS: The heart is mildly enlarged. Aorta slightly tortuous. Hilar contours within normal limits. Patient is kyphotic and the lungs are markedly hyperinflated, with flattening of the diaphragms. There is slight peribronchial thickening at the lung bases. No focal airspace disease or pulmonary edema. Negative for pneumothorax or pleural effusion. There is gaseous distention of the esophagus.  There is a severe compression deformity of the L1 vertebral body. This appears new compared to CT abdomen pelvis of 2009.  IMPRESSION: 1. Marked hyperinflation with peribronchial thickening at the lung bases. Findings likely reflect COPD. Superimposed acute bronchitis not excluded. 2. Gaseous distention of the esophagus. This can be seen in the setting of gastroesophageal reflux, esophageal dysmotility, or distal esophageal narrowing. 3. Cardiomegaly. 4. Severe L1 compression fracture.   Electronically Signed   By: Britta Mccreedy M.D.   On: 01/31/2013 17:54    EKG Interpretation    Date/Time:  Saturday January 31 2013 17:07:16 EST Ventricular Rate:  115 PR Interval:    QRS Duration: 84 QT Interval:  284 QTC Calculation: 392 R Axis:   30 Text  Interpretation:  Atrial fibrillation with rapid ventricular response with premature ventricular or aberrantly conducted complexes T wave abnormality, consider lateral ischemia No previous ECGs available Confirmed by Glendale Wherry  MD-I, Ferdie Bakken ([id number]) on 01/31/2013 5:32:31 PM            MDM  patient presents with respiratory symptoms for the past week however he has new onset atrial fibrillation with fast ventricular response. He also had some lower abdominal pain of uncertain etiology with a normal CT scan. Patient may have some congestive heart area based on his elevated BNP which would go along with his atrial fibrillation although it is not seen on his chest x-ray which does show COPD changes.    1. Atrial fibrillation with rapid ventricular response   2. Acute renal insufficiency   3. Dehydration    Plan admission   I personally performed the services described in this documentation, which was scribed in my presence. The  recorded information has been reviewed and considered.  Devoria AlbeIva Nikka Hakimian, MD, Armando GangFACEP    Ward GivensIva L Brentton Wardlow, MD 02/01/13 Marlyne [id number]

## 2013-01-31 NOTE — ED Notes (Signed)
Went in to round on pt and assess v/s. On cardiac monitor, pt rhythm displayed a-fib. Upon ausculation irregular pattern heard. EDP aware and give verbal order to obtain EKG.

## 2013-01-31 NOTE — Progress Notes (Signed)
ANTICOAGULATION CONSULT NOTE - Preliminary  Pharmacy Consult for Enoxaparin Indication: R/O VTE  Allergies  Allergen Reactions  . Cardura [Doxazosin Mesylate]     Patient Measurements: Height: 5\' 2"  (157.5 cm) Weight: 102 lb 1.2 oz (46.3 kg) IBW/kg (Calculated) : 54.6  Vital Signs: Temp: 98.5 F (36.9 C) (01/03 2235) Temp src: Oral (01/03 2235) BP: 107/60 mmHg (01/03 2235) Pulse Rate: 115 (01/03 2235)  Labs:  Recent Labs  01/31/13 1537 01/31/13 1538  HGB  --  11.7*  HCT  --  33.4*  PLT  --  298  APTT 46*  --   LABPROT 16.1*  --   INR 1.32  --   CREATININE  --  2.14*  TROPONINI <0.30  --    Estimated Creatinine Clearance: 15.3 ml/min (by C-G formula based on Cr of 2.14).  Medical History: Past Medical History  Diagnosis Date  . Hypertension   . Hypothyroid   . Reflux   . Kidney stone   . Polymyalgia rheumatica   . Insomnia   . Pressure ulcer   . Elevated PSA     Medications:   Assessment: 80 yo male admitted with URTI x @1week ; now with mild hypoxia, chest discomfort, new onset atrial fib. with RVR. One dose of therapeutic enoxaparin (1mg /kg) pending VQ scan results   Goal of Therapy:  If enoxaparin continued: 4 hr heparin level 0.6-1.2 units/ml   Plan:  Enoxaparin 1 mg/kg (45 mg) SQ x one dose. Preliminary review of pertinent patient information completed.  Jeani HawkingAnnie Penn clinical pharmacist will complete review during morning rounds to assess the patient and finalize treatment regimen.  Arelia SneddonMason, Tivis Wherry Anne, [facility] Center LLCRPH 01/31/2013,11:37 PM

## 2013-01-31 NOTE — ED Notes (Signed)
Pt up and out of bed, need to go to bathroom. Pt in wheelchair with daughter and assistance to bathroom . Pt having a BM

## 2013-01-31 NOTE — ED Notes (Signed)
CT reported would get pt at 515 per protocol. Due to pt weight, pt has to wait 1.5 drink time per CT.

## 2013-02-01 ENCOUNTER — Encounter (HOSPITAL_COMMUNITY): Payer: Self-pay

## 2013-02-01 ENCOUNTER — Inpatient Hospital Stay (HOSPITAL_COMMUNITY): Payer: Medicare HMO

## 2013-02-01 DIAGNOSIS — I059 Rheumatic mitral valve disease, unspecified: Secondary | ICD-10-CM

## 2013-02-01 DIAGNOSIS — K219 Gastro-esophageal reflux disease without esophagitis: Secondary | ICD-10-CM | POA: Insufficient documentation

## 2013-02-01 DIAGNOSIS — R21 Rash and other nonspecific skin eruption: Secondary | ICD-10-CM | POA: Insufficient documentation

## 2013-02-01 LAB — CBC
HEMATOCRIT: 33.3 % — AB (ref 39.0–52.0)
HEMOGLOBIN: 11.5 g/dL — AB (ref 13.0–17.0)
MCH: 30.9 pg (ref 26.0–34.0)
MCHC: 34.5 g/dL (ref 30.0–36.0)
MCV: 89.5 fL (ref 78.0–100.0)
Platelets: 343 10*3/uL (ref 150–400)
RBC: 3.72 MIL/uL — ABNORMAL LOW (ref 4.22–5.81)
RDW: 13.9 % (ref 11.5–15.5)
WBC: 9.5 10*3/uL (ref 4.0–10.5)

## 2013-02-01 LAB — COMPREHENSIVE METABOLIC PANEL
ALT: 12 U/L (ref 0–53)
AST: 24 U/L (ref 0–37)
Albumin: 3 g/dL — ABNORMAL LOW (ref 3.5–5.2)
Alkaline Phosphatase: 46 U/L (ref 39–117)
BUN: 36 mg/dL — ABNORMAL HIGH (ref 6–23)
CALCIUM: 8.2 mg/dL — AB (ref 8.4–10.5)
CO2: 21 meq/L (ref 19–32)
CREATININE: 1.7 mg/dL — AB (ref 0.50–1.35)
Chloride: 101 mEq/L (ref 96–112)
GFR calc non Af Amer: 34 mL/min — ABNORMAL LOW (ref >90)
GFR, EST AFRICAN AMERICAN: 39 mL/min — AB (ref >90)
Glucose, Bld: 84 mg/dL (ref 70–99)
Potassium: 3.9 mEq/L (ref 3.7–5.3)
SODIUM: 135 meq/L — AB (ref 137–147)
TOTAL PROTEIN: 6.4 g/dL (ref 6.0–8.3)
Total Bilirubin: 0.4 mg/dL (ref 0.3–1.2)

## 2013-02-01 LAB — TROPONIN I: Troponin I: <0.3 ng/mL (ref <0.30)

## 2013-02-01 LAB — CLOSTRIDIUM DIFFICILE BY PCR: CDIFFPCR: NEGATIVE

## 2013-02-01 LAB — T4, FREE: Free T4: 1.45 ng/dL (ref 0.80–1.80)

## 2013-02-01 LAB — TSH: TSH: 0.017 u[IU]/mL — ABNORMAL LOW (ref 0.350–4.500)

## 2013-02-01 MED ORDER — DIGOXIN 125 MCG PO TABS
0.1250 mg | ORAL_TABLET | Freq: Every day | ORAL | Status: DC
  Administered 2013-02-02: 0.125 mg via ORAL
  Filled 2013-02-01: qty 1

## 2013-02-01 MED ORDER — ASPIRIN EC 81 MG PO TBEC
81.0000 mg | DELAYED_RELEASE_TABLET | Freq: Every day | ORAL | Status: DC
  Administered 2013-02-02: 81 mg via ORAL
  Filled 2013-02-01 (×2): qty 1

## 2013-02-01 MED ORDER — DIGOXIN 0.25 MG/ML IJ SOLN
0.2500 mg | Freq: Four times a day (QID) | INTRAMUSCULAR | Status: AC
  Administered 2013-02-01 (×2): 0.25 mg via INTRAVENOUS
  Filled 2013-02-01 (×2): qty 2

## 2013-02-01 MED ORDER — TECHNETIUM TO 99M ALBUMIN AGGREGATED
6.0000 | Freq: Once | INTRAVENOUS | Status: AC | PRN
  Administered 2013-02-01: 6 via INTRAVENOUS

## 2013-02-01 MED ORDER — TECHNETIUM TC 99M DIETHYLENETRIAME-PENTAACETIC ACID
40.0000 | Freq: Once | INTRAVENOUS | Status: AC | PRN
  Administered 2013-02-01: 40 via RESPIRATORY_TRACT

## 2013-02-01 MED ORDER — DOXYCYCLINE HYCLATE 100 MG PO TABS
100.0000 mg | ORAL_TABLET | Freq: Two times a day (BID) | ORAL | Status: DC
  Administered 2013-02-01 – 2013-02-03 (×5): 100 mg via ORAL
  Filled 2013-02-01 (×5): qty 1

## 2013-02-01 MED ORDER — PANTOPRAZOLE SODIUM 40 MG PO TBEC
40.0000 mg | DELAYED_RELEASE_TABLET | Freq: Two times a day (BID) | ORAL | Status: DC
  Administered 2013-02-01 – 2013-02-03 (×5): 40 mg via ORAL
  Filled 2013-02-01 (×5): qty 1

## 2013-02-01 MED ORDER — SODIUM CHLORIDE 0.9 % IV SOLN
INTRAVENOUS | Status: DC
  Administered 2013-02-01 – 2013-02-02 (×3): via INTRAVENOUS

## 2013-02-01 MED ORDER — SODIUM CHLORIDE 0.9 % IV BOLUS (SEPSIS)
250.0000 mL | Freq: Once | INTRAVENOUS | Status: AC
  Administered 2013-02-01: 250 mL via INTRAVENOUS

## 2013-02-01 MED ORDER — LOPERAMIDE HCL 2 MG PO CAPS
2.0000 mg | ORAL_CAPSULE | ORAL | Status: DC | PRN
  Administered 2013-02-01: 2 mg via ORAL
  Filled 2013-02-01: qty 1

## 2013-02-01 NOTE — Progress Notes (Signed)
*  PRELIMINARY RESULTS* Echocardiogram 2D Echocardiogram has been performed.  Conrad BurlingtonLinford, Laelah Siravo Jane 02/01/2013, 11:49 AM

## 2013-02-01 NOTE — Progress Notes (Signed)
ANTICOAGULATION CONSULT NOTE - follow up  Pharmacy Consult for Enoxaparin Indication: R/O VTE  Allergies  Allergen Reactions  . Cardura [Doxazosin Mesylate]    Patient Measurements: Height: 5\' 2"  (157.5 cm) Weight: 102 lb 1.2 oz (46.3 kg) IBW/kg (Calculated) : 54.6  Vital Signs: Temp: 98 F (36.7 C) (01/04 0608) BP: 86/46 mmHg (01/04 1000) Pulse Rate: 92 (01/04 0608)  Labs:  Recent Labs  01/31/13 1537 01/31/13 1538 01/31/13 2324 02/01/13 0528 02/01/13 1101  HGB  --  11.7*  --  11.5*  --   HCT  --  33.4*  --  33.3*  --   PLT  --  298  --  343  --   APTT 46*  --   --   --   --   LABPROT 16.1*  --   --   --   --   INR 1.32  --   --   --   --   CREATININE  --  2.14*  --  1.70*  --   TROPONINI <0.30  --  <0.30 <0.30 <0.30   Estimated Creatinine Clearance: 19.3 ml/min (by C-G formula based on Cr of 1.7).  Medical History: Past Medical History  Diagnosis Date  . Hypertension   . Hypothyroid   . Reflux   . Kidney stone   . Polymyalgia rheumatica   . Insomnia   . Pressure ulcer   . Elevated PSA    Assessment: 80 yo male admitted with URTI x @1week ; now with mild hypoxia, chest discomfort, new onset atrial fib. with RVR. One dose of therapeutic enoxaparin (1mg /kg) pending VQ scan results.  Per MD notes pt and family do not want long-term anticoagulation.  Plan:  Pharmacy will sign off. Re-consult if needed.  Valrie HartHall, Keath Matera A, RPH 02/01/2013,1:21 PM

## 2013-02-01 NOTE — Progress Notes (Signed)
[facility]                                                                                Patient Demographics  Jeffrey Mccarty, is a 80 y.o. male, DOB - Jun 07, 1921, ZOX:[id number]  Admit date - 01/31/2013   Admitting Physician Osvaldo Shipper, MD  Outpatient Primary MD for the patient is Harlow Asa, MD  LOS - 1   Chief Complaint  Patient presents with  . Abdominal Pain        Assessment & Plan    1. Abdominal pain which has now resolved. Stable CT scan abdomen pelvis, he had symptoms consistent with peptic ulcer disease. He also has previous history of bleeding peptic ulcers, improved with IV PPI, we'll transition him twice a day PPI.     2. Dehydration. With acute renal failure. Improving with IV fluids which will be continued.     3. New onset atrial fibrillation with RVR. Likely due to underlying history of emphysema/COPD along with dehydration from above, blood pressure too low to tolerate beta blocker, will place on digoxin, goal will be rate control. He is a poor candidate for long-term anticoagulation due to history of bleeding peptic ulcers and advanced age, risks and benefits of anticoagulation versus not were discussed with patient and his 2 daughters bedside. They both want no anticoagulation at this time. We'll check TSH and echogram for atrial fibrillation. Note his VQ scan which was obtained upon admission is unremarkable.   No results found for this basename: TSH       4. Evidence of BPH and CT scan. No clinical evidence of obstruction, allergic to a blocker, outpatient followup with urology post discharge.       5. H/O unexplained weight loss. Will followup with PCP post discharge . TSH will be checked here.       Code Status: full  Family Communication: daughters  Disposition Plan: Home   Procedures   Echo, CT Abd -Pelvis, VQ   Consults      Medications  Scheduled Meds: . [START ON 02/02/2013] aspirin EC  81 mg Oral Daily  .  digoxin  0.25 mg Intravenous Q6H  . [START ON 02/02/2013] digoxin  0.125 mg Oral Daily  . diltiazem  5 mg Intravenous Once  . doxycycline  100 mg Oral Q12H  . guaiFENesin  600 mg Oral BID  . pantoprazole  40 mg Oral BID  . sodium chloride  3 mL Intravenous Q12H  . triamcinolone ointment  1 application Topical BID   Continuous Infusions: . sodium chloride     PRN Meds:.acetaminophen, acetaminophen, levalbuterol, ondansetron (ZOFRAN) IV, ondansetron, oxyCODONE  DVT Prophylaxis    SCDs    Lab Results  Component Value Date   PLT 343 02/01/2013    Antibiotics    Anti-infectives   Start     Dose/Rate Route Frequency Ordered Stop   02/01/13 1115  doxycycline (VIBRA-TABS) tablet 100 mg     100 mg Oral Every 12 hours 02/01/13 1111     01/31/13 2300  levofloxacin (LEVAQUIN) IVPB 500 mg  Status:  Discontinued     500 mg 100 mL/hr over 60 Minutes Intravenous Every 24 hours  01/31/13 2251 02/01/13 1111          Subjective:   [name] today has, No headache, No chest pain, No abdominal pain - No Nausea, No new weakness tingling or numbness, No Cough - SOB.    Objective:   Filed Vitals:   01/31/13 2100 01/31/13 2235 02/01/13 0608 02/01/13 1000  BP: 107/66 107/60 94/40 86/46   Pulse: 115 115 92   Temp:  98.5 F (36.9 C) 98 F (36.7 C)   TempSrc:  Oral    Resp:   20   Height:  5\' 2"  (1.575 m)    Weight:  46.3 kg (102 lb 1.2 oz)    SpO2: 94% 96% 95%     Wt Readings from Last 3 Encounters:  01/31/13 46.3 kg (102 lb 1.2 oz)  01/26/13 46.267 kg (102 lb)     Intake/Output Summary (Last 24 hours) at 02/01/13 1126 Last data filed at 02/01/13 0622  Gross per 24 hour  Intake 2078.33 ml  Output      0 ml  Net 2078.33 ml    Exam Awake Alert, Oriented X 3, No new F.N deficits, Normal affect .AT,PERRAL Supple Neck,No JVD, No cervical lymphadenopathy appriciated.  Symmetrical Chest wall movement, Good air movement bilaterally, CTAB RRR,No Gallops,Rubs or new Murmurs, No  Parasternal Heave +ve B.Sounds, Abd Soft, Non tender, No organomegaly appriciated, No rebound - guarding or rigidity. No Cyanosis, Clubbing or edema, No new Rash or bruise     Data Review   Micro Results Recent Results (from the past 240 hour(s))  CLOSTRIDIUM DIFFICILE BY PCR     Status: None   Collection Time    02/01/13  6:50 AM      Result Value Range Status   C difficile by pcr NEGATIVE  NEGATIVE Final    Radiology Reports Ct Abdomen Pelvis Wo Contrast  01/31/2013   CLINICAL DATA:  Right-sided abdominal pain. Urolithiasis. Previous appendectomy and cholecystectomy.  EXAM: CT ABDOMEN AND PELVIS WITHOUT CONTRAST  TECHNIQUE: Multidetector CT imaging of the abdomen and pelvis was performed following the standard protocol without intravenous contrast.  COMPARISON:  03/06/2007  FINDINGS: Small to moderate hiatal hernia again seen. Surgical clips from prior cholecystectomy noted. Tiny sub-cm low-attenuation lesion in the anterior left hepatic lobe is stable and consistent with tiny benign cyst. No other liver lesions identified in this noncontrast study. The pancreas, spleen, and adrenal glands are normal in appearance. Renal cysts are noted bilaterally however there is no evidence of renal calculi or hydronephrosis. No evidence of ureteral calculi or dilatation.  Moderately enlarged prostate gland is again seen with mass effect on bladder base. Several bladder diverticula again seen, consistent with chronic bladder outlet obstruction. No bladder calculi identified.  No evidence of abdominal or pelvic lymphadenopathy. No evidence of acute inflammatory process or abnormal fluid collections. No evidence of dilated bowel loops or bowel wall thickening. An old L1 vertebral body compression fracture is noted, which is new since 2009 exam.  IMPRESSION: No evidence of urolithiasis, hydronephrosis, or other acute findings.  Small to moderate hiatal hernia.  Moderately enlarged prostate, with findings of  chronic bladder outlet obstruction.   Electronically Signed   By: Myles RosenthalJohn  Stahl M.D.   On: 01/31/2013 17:49   Dg Chest 2 View  01/31/2013   CLINICAL DATA:  Cough congestion in shortness of breath  EXAM: CHEST  2 VIEW  COMPARISON:  Chest radiograph 04/17/2007 and CT abdomen and pelvis 01/31/2013. CT abdomen pelvis 2009.  FINDINGS: The  heart is mildly enlarged. Aorta slightly tortuous. Hilar contours within normal limits. Patient is kyphotic and the lungs are markedly hyperinflated, with flattening of the diaphragms. There is slight peribronchial thickening at the lung bases. No focal airspace disease or pulmonary edema. Negative for pneumothorax or pleural effusion. There is gaseous distention of the esophagus.  There is a severe compression deformity of the L1 vertebral body. This appears new compared to CT abdomen pelvis of 2009.  IMPRESSION: 1. Marked hyperinflation with peribronchial thickening at the lung bases. Findings likely reflect COPD. Superimposed acute bronchitis not excluded. 2. Gaseous distention of the esophagus. This can be seen in the setting of gastroesophageal reflux, esophageal dysmotility, or distal esophageal narrowing. 3. Cardiomegaly. 4. Severe L1 compression fracture.   Electronically Signed   By: Britta Mccreedy M.D.   On: 01/31/2013 17:54   Nm Pulmonary Perf And Vent  02/01/2013   CLINICAL DATA:  Hypoxia.  Question pulmonary embolism.  EXAM: NUCLEAR MEDICINE VENTILATION - PERFUSION LUNG SCAN  TECHNIQUE: Ventilation images were obtained in multiple projections using inhaled aerosol technetium 99 M DTPA. Perfusion images were obtained in multiple projections after intravenous injection of Tc-62m MAA.  COMPARISON:  Chest radiographs and abdominal CT 01/31/2013.  RADIOPHARMACEUTICALS:  40.0 mCi Tc-21m DTPA aerosol and 6.0 mCi Tc-89m MAA  FINDINGS: Ventilation: There is clumping of the aerosol within the central tracheobronchial tree. There is patchy ventilation in both lungs consistent with  chronic obstructive pulmonary disease.  Perfusion: The perfusion is similar to the ventilatory examination with multiple small peripheral perfusion defects attributed to chronic obstructive pulmonary disease. No ventilation/perfusion mismatches are identified to suggest acute pulmonary embolism.  IMPRESSION: 1. Low probability for acute pulmonary embolism. 2. Matched ventilation and perfusion defects in both lungs consistent with chronic obstructive pulmonary disease.   Electronically Signed   By: Roxy Horseman M.D.   On: 02/01/2013 09:21    CBC  Recent Labs Lab 01/31/13 1538 02/01/13 0528  WBC 8.2 9.5  HGB 11.7* 11.5*  HCT 33.4* 33.3*  PLT 298 343  MCV 87.7 89.5  MCH 30.7 30.9  MCHC 35.0 34.5  RDW 13.5 13.9  LYMPHSABS 1.0  --   MONOABS 0.7  --   EOSABS 0.2  --   BASOSABS 0.1  --     Chemistries   Recent Labs Lab 01/31/13 1538 02/01/13 0528  NA 134* 135*  K 4.3 3.9  CL 98 101  CO2 21 21  GLUCOSE 101* 84  BUN 51* 36*  CREATININE 2.14* 1.70*  CALCIUM 8.9 8.2*  AST 23 24  ALT 10 12  ALKPHOS 48 46  BILITOT 0.3 0.4   ------------------------------------------------------------------------------------------------------------------ estimated creatinine clearance is 19.3 ml/min (by C-G formula based on Cr of 1.7). ------------------------------------------------------------------------------------------------------------------ No results found for this basename: HGBA1C,  in the last 72 hours ------------------------------------------------------------------------------------------------------------------ No results found for this basename: CHOL, HDL, LDLCALC, TRIG, CHOLHDL, LDLDIRECT,  in the last 72 hours ------------------------------------------------------------------------------------------------------------------ No results found for this basename: TSH, T4TOTAL, FREET3, T3FREE, THYROIDAB,  in the last 72  hours ------------------------------------------------------------------------------------------------------------------ No results found for this basename: VITAMINB12, FOLATE, FERRITIN, TIBC, IRON, RETICCTPCT,  in the last 72 hours  Coagulation profile  Recent Labs Lab 01/31/13 1537  INR 1.32    No results found for this basename: DDIMER,  in the last 72 hours  Cardiac Enzymes  Recent Labs Lab 01/31/13 1537 01/31/13 2324 02/01/13 0528  TROPONINI <0.30 <0.30 <0.30   ------------------------------------------------------------------------------------------------------------------ No components found with this basename: POCBNP,  Time Spent in minutes   35   Ashana Tullo K M.D on 02/01/2013 at 11:26 AM  Between 7am to 7pm - Pager - [phone]  After 7pm go to [email or web address] - password [email or web address]  And look for the night coverage person covering for me after hours  [facility] Office  [phone]

## 2013-02-01 NOTE — Progress Notes (Signed)
Patients BP 86/46. Dr Thedore MinsSingh in room, ordered to hold lopressor and give 250cc bolus.

## 2013-02-01 NOTE — Progress Notes (Signed)
Patient has converted from A-fib to NSR with PACs per telemetry tech. Contacted MD on call to inform of this.

## 2013-02-02 DIAGNOSIS — I10 Essential (primary) hypertension: Secondary | ICD-10-CM

## 2013-02-02 LAB — T3, FREE: T3 FREE: 3.5 pg/mL (ref 2.3–4.2)

## 2013-02-02 LAB — T4: T4 TOTAL: 9.7 ug/dL (ref 5.0–12.5)

## 2013-02-02 MED ORDER — DIGOXIN 125 MCG PO TABS: 0.1250 mg | ORAL_TABLET | ORAL | Status: DC

## 2013-02-02 MED ORDER — ASPIRIN EC 325 MG PO TBEC
325.0000 mg | DELAYED_RELEASE_TABLET | Freq: Every day | ORAL | Status: DC
  Administered 2013-02-03: 325 mg via ORAL
  Filled 2013-02-02: qty 1

## 2013-02-02 MED ORDER — ENSURE COMPLETE PO LIQD
237.0000 mL | Freq: Two times a day (BID) | ORAL | Status: DC
  Administered 2013-02-02 – 2013-02-03 (×2): 237 mL via ORAL

## 2013-02-02 MED ORDER — GUAIFENESIN-DM 100-10 MG/5ML PO SYRP
5.0000 mL | ORAL_SOLUTION | ORAL | Status: DC | PRN
  Administered 2013-02-02 – 2013-02-03 (×2): 5 mL via ORAL
  Filled 2013-02-02 (×2): qty 5

## 2013-02-02 NOTE — Consult Note (Addendum)
CARDIOLOGY CONSULT NOTE   Patient ID: Jeffrey Mccarty MRN: [id number] DOB/AGE: 1921/09/14 80 y.o.  Admit Date: 01/31/2013 Referring Physician: PTH Primary Physician: Harlow AsaLUKING,W S, MD Consulting Cardiologist: Dina RichBranch, Denzil Bristol MD Primary Cardiologist: Formerly Dietrich Patesothbart Reason for Consultation: Atrial fibrillation   Clinical Summary Jeffrey Mccarty is a 80 y.o.male admitted with atrial fib with RVR. No prior cardiac history or testing. Patient states approx 2-3 days prior to admission he felt his heart rate irregular, jumping, but no associated pain. He also complained of some abdominal burning and discomfort He had seen PCP earlier that week for cold symptoms, coughing and congestion and was placed on Levaquin. Daughters thought that stomach upset and burning was related to medications. Due to ongoing abdominal discomfort, daughters brought him to ER.  In ER he was found to be in atrial fib with RVR, rates 116 bpm. CXR was negative for CHF, but did refect COPD, and gaseous distention of the esophagus, along with cardiomegaly. He was given IV cardizem 2.5 mg X 2 and started on oral metoprolol. He converted to NSR last evening (02/01/13) around 8 pm. He has been placed on digoxin.Other findings included dehydration with acute renal failure, Creatinine 2.14. Now improved to 1.70.   Other history includes hypertension, BPH, GERD, and PUD. He denied chest pain, but had some productive  coughing, without fever.      Allergies  Allergen Reactions  . Cardura [Doxazosin Mesylate]     Medications Scheduled Medications: . aspirin EC  81 mg Oral Daily  . digoxin  0.125 mg Oral Daily  . diltiazem  5 mg Intravenous Once  . doxycycline  100 mg Oral Q12H  . guaiFENesin  600 mg Oral BID  . pantoprazole  40 mg Oral BID  . sodium chloride  3 mL Intravenous Q12H  . triamcinolone ointment  1 application Topical BID    Infusions:    PRN Medications: acetaminophen, acetaminophen,  guaiFENesin-dextromethorphan, levalbuterol, loperamide, ondansetron (ZOFRAN) IV, ondansetron, oxyCODONE   Past Medical History  Diagnosis Date  . Hypertension   . Hypothyroid   . Reflux   . Kidney stone   . Polymyalgia rheumatica   . Insomnia   . Pressure ulcer   . Elevated PSA     Past Surgical History  Procedure Laterality Date  . Appendectomy    . Cholecystectomy    . Thyroidectomy    . Tonsillectomy      Family History  Problem Relation Age of Onset  . Heart attack Father     Social History Jeffrey Mccarty reports that he has never smoked. He does not have any smokeless tobacco history on file. Jeffrey Mccarty reports that he does not drink alcohol.  Review of Systems Otherwise reviewed and negative except as outlined.  Physical Examination Blood pressure 98/50, pulse 80, temperature 98.3 F (36.8 C), temperature source Oral, resp. rate 20, height 5\' 2"  (1.575 m), weight 102 lb 1.2 oz (46.3 kg), SpO2 95.00%.  Intake/Output Summary (Last 24 hours) at 02/02/13 1052 Last data filed at 02/02/13 0608  Gross per 24 hour  Intake 2675.42 ml  Output      0 ml  Net 2675.42 ml    Telemetry:NSR in 80's. (converted to NSR 8 pm 02/02/2012)  HEENT: Conjunctiva and lids normal, oropharynx clear with moist mucosa. Neck: Supple, no elevated JVP or carotid bruits, no thyromegaly. Lungs: Clear to auscultation,with exception of mild crackles in the bases. No overt wheezing or coughing Cardiac: Regular rate and rhythm, 1/6 holosystolic murmur,  no pericardial rub. Abdomen: Soft, nontender, no hepatomegaly, bowel sounds present, no guarding or rebound. Extremities: No pitting edema, distal pulses 2+. Skin: Warm and dry. Musculoskeletal: No kyphosis. Neuropsychiatric: Alert and oriented x3, affect grossly appropriate.very hard of hearing.   Prior Cardiac Testing/Procedures 1. Echocardiogram 02/01/2013 Left ventricle: There was moderate focal basal hypertrophy of the septum. Systolic  function was normal. The estimated ejection fraction was in the range of 55% to 60%. There is akinesis of the basalinferolateral myocardium. Features are consistent with a pseudonormal left ventricular filling pattern, with concomitant abnormal relaxation and increased filling pressure (grade 2 diastolic dysfunction). - Aortic valve: Poorly visualized. Mildly calcified annulus. Moderately calcified leaflets. Trivial regurgitation. Mean gradient: 21mm Hg (S). VTI ratio of LVOT to aortic valve: 0.29. - Mitral valve: Calcified annulus. Mildly thickened leaflets . Mild regurgitation. - Left atrium: The atrium was mildly to moderately dilated. - Tricuspid valve: Mild regurgitation. Peak RV-RA gradient: 42mm Hg (S). - Pulmonary arteries: Systolic pressure could not be accurately estimated. - Inferior vena cava: Not visualized. Unable to estimate CVP. - Pericardium, extracardiac: There was no pericardial effusion.   Lab Results  Basic Metabolic Panel:  Recent Labs Lab 01/31/13 1538 02/01/13 0528  NA 134* 135*  K 4.3 3.9  CL 98 101  CO2 21 21  GLUCOSE 101* 84  BUN 51* 36*  CREATININE 2.14* 1.70*  CALCIUM 8.9 8.2*    Liver Function Tests:  Recent Labs Lab 01/31/13 1538 02/01/13 0528  AST 23 24  ALT 10 12  ALKPHOS 48 46  BILITOT 0.3 0.4  PROT 7.1 6.4  ALBUMIN 3.3* 3.0*    CBC:  Recent Labs Lab 01/31/13 1538 02/01/13 0528  WBC 8.2 9.5  NEUTROABS 6.3  --   HGB 11.7* 11.5*  HCT 33.4* 33.3*  MCV 87.7 89.5  PLT 298 343    Cardiac Enzymes:  Recent Labs Lab 01/31/13 1537 01/31/13 2324 02/01/13 0528 02/01/13 1101  TROPONINI <0.30 <0.30 <0.30 <0.30    BNP: 3826   Radiology: Ct Abdomen Pelvis Wo Contrast  01/31/2013   CLINICAL DATA:  Right-sided abdominal pain. Urolithiasis. Previous appendectomy and cholecystectomy.  EXAM: CT ABDOMEN AND PELVIS WITHOUT CONTRAST  TECHNIQUE: .  IMPRESSION: No evidence of urolithiasis, hydronephrosis, or other acute  findings.  Small to moderate hiatal hernia.  Moderately enlarged prostate, with findings of chronic bladder outlet obstruction.   Electronically Signed   By: Myles Rosenthal M.D.   On: 01/31/2013 17:49   Dg Chest 2 View  01/31/2013   CLINICAL DATA:IMPRESSION: 1. Marked hyperinflation with peribronchial thickening at the lung bases. Findings likely reflect COPD. Superimposed acute bronchitis not excluded. 2. Gaseous distention of the esophagus. This can be seen in the setting of gastroesophageal reflux, esophageal dysmotility, or distal esophageal narrowing. 3. Cardiomegaly. 4. Severe L1 compression fracture.   Electronically Signed   By: Britta Mccreedy M.D.   On: 01/31/2013 17:54   Nm Pulmonary Perf And Vent  02/01/2013   CLINICAL DATA:  Hypoxia.  Question pulmonary embolism.  EXAM: NUCLEAR MEDICINE VENTILATION - PERFUSION LUNG SCAN  TECHNIQUE:   IMPRESSION: 1. Low probability for acute pulmonary embolism. 2. Matched ventilation and perfusion defects in both lungs consistent with chronic obstructive pulmonary disease.   Electronically Signed   By: Roxy Horseman M.D.   On: 02/01/2013 09:21     ECG: (Most recent) atrial fib. Repeat this am.   Impression and Recommendations  1.Atrial fib with RVR: Now converted to NSR as of last evening on  2 doses of IV cardizem, metoprolol and dig loading. Will repeat EKG for verification of rhythm.Echo demonstrates LA mildly to moderately dilatated. CHADS; score 2, (hypertension, age) with ischemic stroke risk of 4.2%. CHADS Vasc score of 3 with 3.2% annual stroke risk. - primary team has discussed risks and benefits of anticoag with patient and family, reiterated the discussion again today with patient. Family and patient concerned about his GI history and overall risk of bleeding, prefer ASA to anticoag. - continue ASA for stroke prevention - change digoxin to 0.125 mg every other day based on renal function.   2. Hypertension: - has had low normal blood pressures as  inpatient, home bp meds on hold - continue to follow blood pressures.   3. Hyperthyroidism: Both TSH and Free T4 are low.  These are being repeated by PTH today.   Signed: Bettey Mare. Lyman Bishop NP [facility] 02/02/2013, 10:52 AM Co-Sign MD  Attending Note Patient seen and discussed with NP Lyman Bishop. 80 yo male admitted with new onset afib, now back in sinus rhythm. Low blood pressures, currently just on digoxin 0.125mg  for rate control. Telemetry shows normal sinus rhythm with normal rates. Based on his GFR of 34 will change digoxin to every other day dosing. Readressed stoke risk with patient and family, there preference is ASA over anticoag due to the patient's history of peptic ulcer disease and concern for falls. Continue ASA. I would change to full dose ASA for stroke prophylaxis. Patient may follow up with either myself of NP Lawrence in 3 weeks, cardiology will sign off inpatient care. Please call with questions.   Dina Rich MD

## 2013-02-02 NOTE — Clinical Social Work Note (Signed)
CSW received referral for possible placement. PT evaluated pt and no PT follow up recommended. Plan is for return home. CSW will sign off but can be reconsulted if needed.  Derenda FennelKara Aimy Sweeting, Kentucky[place]CSW [phone]

## 2013-02-02 NOTE — Progress Notes (Signed)
[facility]                                                                                Patient Demographics  Jeffrey Mccarty, is a 80 y.o. male, DOB - 19-Aug-1921, [id number]  Admit date - 01/31/2013   Admitting Physician Osvaldo Shipper, MD  Outpatient Primary MD for the patient is Harlow Asa, MD  LOS - 2   Chief Complaint  Patient presents with  . Abdominal Pain        Assessment & Plan    1. Abdominal pain which has now resolved. Stable CT scan abdomen pelvis, he had symptoms consistent with peptic ulcer disease. He also has previous history of bleeding peptic ulcers, improved with IV PPI, we'll transition him twice a day PPI.      2. Dehydration. With acute renal failure. He has improved after hydration.      3. New onset atrial fibrillation with RVR. Likely due to underlying history of emphysema/COPD along with dehydration from above, blood pressure too low to tolerate beta blocker, on digoxin, has converted to sinus rhythm. He is a poor candidate for long-term anticoagulation due to history of bleeding peptic ulcers and advanced age, risks and benefits of anticoagulation versus not were discussed with patient and his 2 daughters bedside. They both want no anticoagulation at this time. Repeat TSH with free T3 and T4, stable echogram . Note his VQ scan which was obtained upon admission is unremarkable.   Lab Results  Component Value Date   TSH 0.017* 01/31/2013       4. Evidence of BPH and CT scan. No clinical evidence of obstruction, allergic to a blocker, outpatient followup with urology post discharge.        5. H/O unexplained weight loss. Will followup with PCP post discharge .   TSH is somewhat low, will repeat TSH along with free T3 and T4.   Lab Results  Component Value Date   TSH 0.017* 01/31/2013      6. Diarrhea. C. difficile negative. Likely viral gastroenteritis, Imodium as needed.     7. Questionable mild bronchitis on chest  x-ray. Stable and improved on doxycycline.       Code Status: full  Family Communication: daughters  Disposition Plan: Home/SNF, PT to evaluate    Procedures   Echo, CT Abd -Pelvis, VQ   Consults   Cards   Medications  Scheduled Meds: . aspirin EC  81 mg Oral Daily  . digoxin  0.125 mg Oral Daily  . doxycycline  100 mg Oral Q12H  . feeding supplement (ENSURE COMPLETE)  237 mL Oral BID BM  . guaiFENesin  600 mg Oral BID  . pantoprazole  40 mg Oral BID  . sodium chloride  3 mL Intravenous Q12H  . triamcinolone ointment  1 application Topical BID   Continuous Infusions:   PRN Meds:.acetaminophen, guaiFENesin-dextromethorphan, levalbuterol, loperamide, ondansetron (ZOFRAN) IV, oxyCODONE  DVT Prophylaxis    SCDs    Lab Results  Component Value Date   PLT 343 02/01/2013    Antibiotics    Anti-infectives   Start     Dose/Rate Route Frequency Ordered Stop   02/01/13 1115  doxycycline (VIBRA-TABS) tablet 100 mg     100 mg Oral Every 12 hours 02/01/13 1111     01/31/13 2300  levofloxacin (LEVAQUIN) IVPB 500 mg  Status:  Discontinued     500 mg 100 mL/hr over 60 Minutes Intravenous Every 24 hours 01/31/13 2251 02/01/13 1111          Subjective:   Jeffrey Mccarty today has, No headache, No chest pain, No abdominal pain - No Nausea, No new weakness tingling or numbness, No Cough - SOB.    Objective:   Filed Vitals:   02/01/13 1323 02/01/13 1500 02/01/13 2324 02/02/13 0706  BP: 93/55 104/60 108/58 98/50  Pulse:  65 62 80  Temp:  98.1 F (36.7 C) 98 F (36.7 C) 98.3 F (36.8 C)  TempSrc:      Resp:  18 18 20   Height:      Weight:      SpO2:  92% 93% 95%    Wt Readings from Last 3 Encounters:  01/31/13 46.3 kg (102 lb 1.2 oz)  01/26/13 46.267 kg (102 lb)     Intake/Output Summary (Last 24 hours) at 02/02/13 1158 Last data filed at 02/02/13 0608  Gross per 24 hour  Intake 2675.42 ml  Output      0 ml  Net 2675.42 ml    Exam Awake Alert, Oriented  X 3, No new F.N deficits, Normal affect [place].AT,PERRAL Supple Neck,No JVD, No cervical lymphadenopathy appriciated.  Symmetrical Chest wall movement, Good air movement bilaterally, CTAB RRR,No Gallops,Rubs or new Murmurs, No Parasternal Heave +ve B.Sounds, Abd Soft, Non tender, No organomegaly appriciated, No rebound - guarding or rigidity. No Cyanosis, Clubbing or edema, No new Rash or bruise     Data Review   Micro Results Recent Results (from the past 240 hour(s))  CLOSTRIDIUM DIFFICILE BY PCR     Status: None   Collection Time    02/01/13  6:50 AM      Result Value Range Status   C difficile by pcr NEGATIVE  NEGATIVE Final    Radiology Reports Ct Abdomen Pelvis Wo Contrast  01/31/2013   CLINICAL DATA:  Right-sided abdominal pain. Urolithiasis. Previous appendectomy and cholecystectomy.  EXAM: CT ABDOMEN AND PELVIS WITHOUT CONTRAST  TECHNIQUE: Multidetector CT imaging of the abdomen and pelvis was performed following the standard protocol without intravenous contrast.  COMPARISON:  03/06/2007  FINDINGS: Small to moderate hiatal hernia again seen. Surgical clips from prior cholecystectomy noted. Tiny sub-cm low-attenuation lesion in the anterior left hepatic lobe is stable and consistent with tiny benign cyst. No other liver lesions identified in this noncontrast study. The pancreas, spleen, and adrenal glands are normal in appearance. Renal cysts are noted bilaterally however there is no evidence of renal calculi or hydronephrosis. No evidence of ureteral calculi or dilatation.  Moderately enlarged prostate gland is again seen with mass effect on bladder base. Several bladder diverticula again seen, consistent with chronic bladder outlet obstruction. No bladder calculi identified.  No evidence of abdominal or pelvic lymphadenopathy. No evidence of acute inflammatory process or abnormal fluid collections. No evidence of dilated bowel loops or bowel wall thickening. An old L1 vertebral body  compression fracture is noted, which is new since 2009 exam.  IMPRESSION: No evidence of urolithiasis, hydronephrosis, or other acute findings.  Small to moderate hiatal hernia.  Moderately enlarged prostate, with findings of chronic bladder outlet obstruction.   Electronically Signed   By: Myles Rosenthal M.D.   On: 01/31/2013 17:49  Dg Chest 2 View  01/31/2013   CLINICAL DATA:  Cough congestion in shortness of breath  EXAM: CHEST  2 VIEW  COMPARISON:  Chest radiograph 04/17/2007 and CT abdomen and pelvis 01/31/2013. CT abdomen pelvis 2009.  FINDINGS: The heart is mildly enlarged. Aorta slightly tortuous. Hilar contours within normal limits. Patient is kyphotic and the lungs are markedly hyperinflated, with flattening of the diaphragms. There is slight peribronchial thickening at the lung bases. No focal airspace disease or pulmonary edema. Negative for pneumothorax or pleural effusion. There is gaseous distention of the esophagus.  There is a severe compression deformity of the L1 vertebral body. This appears new compared to CT abdomen pelvis of 2009.  IMPRESSION: 1. Marked hyperinflation with peribronchial thickening at the lung bases. Findings likely reflect COPD. Superimposed acute bronchitis not excluded. 2. Gaseous distention of the esophagus. This can be seen in the setting of gastroesophageal reflux, esophageal dysmotility, or distal esophageal narrowing. 3. Cardiomegaly. 4. Severe L1 compression fracture.   Electronically Signed   By: Britta Mccreedy M.D.   On: 01/31/2013 17:54   Nm Pulmonary Perf And Vent  02/01/2013   CLINICAL DATA:  Hypoxia.  Question pulmonary embolism.  EXAM: NUCLEAR MEDICINE VENTILATION - PERFUSION LUNG SCAN  TECHNIQUE: Ventilation images were obtained in multiple projections using inhaled aerosol technetium 99 M DTPA. Perfusion images were obtained in multiple projections after intravenous injection of Tc-57m MAA.  COMPARISON:  Chest radiographs and abdominal CT 01/31/2013.   RADIOPHARMACEUTICALS:  40.0 mCi Tc-72m DTPA aerosol and 6.0 mCi Tc-39m MAA  FINDINGS: Ventilation: There is clumping of the aerosol within the central tracheobronchial tree. There is patchy ventilation in both lungs consistent with chronic obstructive pulmonary disease.  Perfusion: The perfusion is similar to the ventilatory examination with multiple small peripheral perfusion defects attributed to chronic obstructive pulmonary disease. No ventilation/perfusion mismatches are identified to suggest acute pulmonary embolism.  IMPRESSION: 1. Low probability for acute pulmonary embolism. 2. Matched ventilation and perfusion defects in both lungs consistent with chronic obstructive pulmonary disease.   Electronically Signed   By: Roxy Horseman M.D.   On: 02/01/2013 09:21    CBC  Recent Labs Lab 01/31/13 1538 02/01/13 0528  WBC 8.2 9.5  HGB 11.7* 11.5*  HCT 33.4* 33.3*  PLT 298 343  MCV 87.7 89.5  MCH 30.7 30.9  MCHC 35.0 34.5  RDW 13.5 13.9  LYMPHSABS 1.0  --   MONOABS 0.7  --   EOSABS 0.2  --   BASOSABS 0.1  --     Chemistries   Recent Labs Lab 01/31/13 1538 02/01/13 0528  NA 134* 135*  K 4.3 3.9  CL 98 101  CO2 21 21  GLUCOSE 101* 84  BUN 51* 36*  CREATININE 2.14* 1.70*  CALCIUM 8.9 8.2*  AST 23 24  ALT 10 12  ALKPHOS 48 46  BILITOT 0.3 0.4   ------------------------------------------------------------------------------------------------------------------ estimated creatinine clearance is 19.3 ml/min (by C-G formula based on Cr of 1.7). ------------------------------------------------------------------------------------------------------------------ No results found for this basename: HGBA1C,  in the last 72 hours ------------------------------------------------------------------------------------------------------------------ No results found for this basename: CHOL, HDL, LDLCALC, TRIG, CHOLHDL, LDLDIRECT,  in the last 72  hours ------------------------------------------------------------------------------------------------------------------  Recent Labs  01/31/13 2324  TSH 0.017*   ------------------------------------------------------------------------------------------------------------------ No results found for this basename: VITAMINB12, FOLATE, FERRITIN, TIBC, IRON, RETICCTPCT,  in the last 72 hours  Coagulation profile  Recent Labs Lab 01/31/13 1537  INR 1.32    No results found for this basename: DDIMER,  in the  last 72 hours  Cardiac Enzymes  Recent Labs Lab 01/31/13 2324 02/01/13 0528 02/01/13 1101  TROPONINI <0.30 <0.30 <0.30   ------------------------------------------------------------------------------------------------------------------ No components found with this basename: POCBNP,      Time Spent in minutes   35   Jemario Poitras K M.D on 02/02/2013 at 11:58 AM  Between 7am to 7pm - Pager - [phone]  After 7pm go to [email or web address] - password [email or web address]  And look for the night coverage person covering for me after hours  [facility] Office  [phone]

## 2013-02-02 NOTE — Progress Notes (Signed)
INITIAL NUTRITION ASSESSMENT  DOCUMENTATION CODES Per approved criteria  -Non-severe (moderate) malnutrition in the context of chronic illness   INTERVENTION: Ensure Complete po BID, each supplement provides 350 kcal and 13 grams of protein Honor pt food preferences to maximize meal intake Assist tray set-up and with meals as needed Encourage intake of meals and supplements  NUTRITION DIAGNOSIS: Inadequate oral itnake related to abdominal pain, decreased appetite as evidenced by dehydration and hx of PUD, unplanned wt loss over time 15#.   Goal: Pt to meet >/= 90% of their estimated nutrition needs   Monitor:  Po intake, labs and wt trends  Reason for Assessment: Malnutrition Screen  80 y.o. male  Admitting Dx: Atrial fibrillation with RVR  ASSESSMENT: Very pleasant 80 yo male who presented to ED with abdominal pain, coughing, congestion. He reports that he's always been a small man but has experienced gradual wt loss of 7 kg over past months. His eldest daughter is present and says the family helps provide his meals and he has plenty of food but his appetite has also been poor. His food preferences obtained and diet office aware. He drinks mostly coffee and Dr Alcus DadPeppers at home but is willing to add nutrition supplement to improve nutrition intake. It would be beneficial for pt to continue oral nutrition daily BID between meals as part of his discharge plan. Suspect inadequate oral intake and calorie deficit over time. Observed muscle wasting (clavicle,scapular) and fat loss (orbital, biceps/triceps).   Height: Ht Readings from Last 1 Encounters:  01/31/13 5\' 2"  (1.575 m)    Weight: Wt Readings from Last 1 Encounters:  01/31/13 102 lb 1.2 oz (46.3 kg)    Ideal Body Weight: 118# (53.6 kg)  % Ideal Body Weight:   Wt Readings from Last 10 Encounters:  01/31/13 102 lb 1.2 oz (46.3 kg)  01/26/13 102 lb (46.267 kg)    Usual Body Weight: 115-118##  % Usual Body Weight:  100%  BMI:  Body mass index is 18.66 kg/(m^2). normal range  Estimated Nutritional Needs: Kcal: 1500-1800 Protein: 70 gr Fluid: 1500 ml daily  Skin: intact  Diet Order: Cardiac  EDUCATION NEEDS: -Education not appropriate at this time   Intake/Output Summary (Last 24 hours) at 02/02/13 1111 Last data filed at 02/02/13 0608  Gross per 24 hour  Intake 2675.42 ml  Output      0 ml  Net 2675.42 ml    Last BM: 02/01/13  Labs:   Recent Labs Lab 01/31/13 1538 02/01/13 0528  NA 134* 135*  K 4.3 3.9  CL 98 101  CO2 21 21  BUN 51* 36*  CREATININE 2.14* 1.70*  CALCIUM 8.9 8.2*  GLUCOSE 101* 84    CBG (last 3)  No results found for this basename: GLUCAP,  in the last 72 hours  Scheduled Meds: . aspirin EC  81 mg Oral Daily  . digoxin  0.125 mg Oral Daily  . diltiazem  5 mg Intravenous Once  . doxycycline  100 mg Oral Q12H  . guaiFENesin  600 mg Oral BID  . pantoprazole  40 mg Oral BID  . sodium chloride  3 mL Intravenous Q12H  . triamcinolone ointment  1 application Topical BID    Continuous Infusions:   Past Medical History  Diagnosis Date  . Hypertension   . Hypothyroid   . Reflux   . Kidney stone   . Polymyalgia rheumatica   . Insomnia   . Pressure ulcer   . Elevated PSA  Past Surgical History  Procedure Laterality Date  . Appendectomy    . Cholecystectomy    . Thyroidectomy    . Tonsillectomy      Royann Shivers MS,RD,CSG,LDN Office: [phone] Pager: [phone]

## 2013-02-02 NOTE — Evaluation (Signed)
Physical Therapy Evaluation Patient Details Name: Jeffrey Mccarty MRN: [id number] DOB: 17-Nov-1921 Today's Date: 02/02/2013 Time: [id number] PT Time Calculation (min): 30 min  PT Assessment / Plan / Recommendation History of Present Illness  Pt is a delightful gentleman who is normally very independent at home (lives alone) who was admitted for new onset of Afib and upper respiratory tract infection.  He has a hx of COPD.  Clinical Impression   Pt was found to be at prior functional level.  I discussed home safety with him and they understand all teaching.  He would benefit from having access to a [employer].    PT Assessment  Patent does not need any further PT services    Follow Up Recommendations  No PT follow up    Does the patient have the potential to tolerate intense rehabilitation      Barriers to Discharge        Equipment Recommendations  None recommended by PT    Recommendations for Other Services     Frequency      Precautions / Restrictions Precautions Precautions: None Restrictions Weight Bearing Restrictions: No   Pertinent Vitals/Pain       Mobility  Bed Mobility Bed Mobility: Supine to Sit;Sit to Supine Supine to Sit: 6: Modified independent (Device/Increase time) Sit to Supine: 6: Modified independent (Device/Increase time) Transfers Transfers: Sit to Stand;Stand to Sit Sit to Stand: 6: Modified independent (Device/Increase time);With upper extremity assist Stand to Sit: 6: Modified independent (Device/Increase time);With upper extremity assist Ambulation/Gait Ambulation/Gait Assistance: 6: Modified independent (Device/Increase time) Ambulation Distance (Feet): 200 Feet Assistive device: None Gait Pattern: Trunk flexed Gait velocity: WNL Stairs: No Wheelchair Mobility Wheelchair Mobility: No    Exercises     PT Diagnosis:    PT Problem List:   PT Treatment Interventions:       PT Goals(Current goals can be found in the care plan  section) Acute Rehab PT Goals PT Goal Formulation: No goals set, d/c therapy  Visit Information  Last PT Received On: 02/02/13 History of Present Illness: Pt is a delightful gentleman who is normally very independent at home (lives alone) who was admitted for new onset of Afib and upper respiratory tract infection.  He has a hx of COPD.       Prior Functioning  Home Living Family/patient expects to be discharged to:: Private residence Living Arrangements: Alone Available Help at Discharge: Family;Friend(s);Neighbor;Available PRN/intermittently Type of Home: House Home Access: Level entry Home Layout: One level Home Equipment: None Additional Comments: have recommended that pt have a Life Line at home Prior Function Level of Independence: Independent Communication Communication: HOH    Cognition  Cognition Arousal/Alertness: Awake/alert Behavior During Therapy: WFL for tasks assessed/performed Overall Cognitive Status: Within Functional Limits for tasks assessed    Extremity/Trunk Assessment Lower Extremity Assessment Lower Extremity Assessment: Overall WFL for tasks assessed   Balance Balance Balance Assessed: Yes High Level Balance High Level Balance Activites: Side stepping;Backward walking;Direction changes;Turns;Head turns High Level Balance Comments: no LOB with above  End of Session PT - End of Session Equipment Utilized During Treatment: Gait belt Activity Tolerance: Patient tolerated treatment well Patient left: in bed;with call bell/phone within reach;with family/visitor present Nurse Communication: Mobility status  GP     Jeffrey Mccarty, Jeffrey Mccarty 02/02/2013, 2:43 PM

## 2013-02-03 DIAGNOSIS — E440 Moderate protein-calorie malnutrition: Secondary | ICD-10-CM | POA: Insufficient documentation

## 2013-02-03 LAB — BASIC METABOLIC PANEL
BUN: 23 mg/dL (ref 6–23)
CO2: 22 mEq/L (ref 19–32)
CREATININE: 1.32 mg/dL (ref 0.50–1.35)
Calcium: 8 mg/dL — ABNORMAL LOW (ref 8.4–10.5)
Chloride: 104 mEq/L (ref 96–112)
GFR calc Af Amer: 53 mL/min — ABNORMAL LOW (ref >90)
GFR calc non Af Amer: 46 mL/min — ABNORMAL LOW (ref >90)
Glucose, Bld: 106 mg/dL — ABNORMAL HIGH (ref 70–99)
POTASSIUM: 4 meq/L (ref 3.7–5.3)
Sodium: 136 mEq/L — ABNORMAL LOW (ref 137–147)

## 2013-02-03 MED ORDER — GUAIFENESIN-DM 100-10 MG/5ML PO SYRP: 5.0000 mL | ORAL_SOLUTION | ORAL | Status: DC | PRN

## 2013-02-03 MED ORDER — ASPIRIN 325 MG PO TBEC: 325.0000 mg | DELAYED_RELEASE_TABLET | Freq: Every day | ORAL | Status: DC

## 2013-02-03 MED ORDER — DIGOXIN 125 MCG PO TABS: 0.1250 mg | ORAL_TABLET | ORAL | Status: DC

## 2013-02-03 MED ORDER — LOPERAMIDE HCL 2 MG PO CAPS: 2.0000 mg | ORAL_CAPSULE | ORAL | Status: DC | PRN

## 2013-02-03 MED ORDER — ENSURE COMPLETE PO LIQD: 237.0000 mL | Freq: Two times a day (BID) | ORAL | Status: AC

## 2013-02-03 MED ORDER — DOXYCYCLINE HYCLATE 100 MG PO TABS: 100.0000 mg | ORAL_TABLET | Freq: Two times a day (BID) | ORAL | Status: DC

## 2013-02-03 MED ORDER — ESOMEPRAZOLE MAGNESIUM 40 MG PO CPDR: 40.0000 mg | DELAYED_RELEASE_CAPSULE | Freq: Two times a day (BID) | ORAL | Status: DC

## 2013-02-03 NOTE — Discharge Summary (Signed)
[facility]                                                                                   [name], is a 80 y.o. male  DOB Jul 08, 1921  MRN [id number].  Admission date:  01/31/2013  Admitting Physician  Jeffrey ShipperGokul Krishnan, MD  Discharge Date:  02/03/2013   Primary MD  Jeffrey AsaLUKING,W S, MD  Recommendations for primary care physician for things to follow:   Monitor TSH, free T3 and T4 closely   Admission Diagnosis  Dehydration [276.51] ARF (acute renal failure) [584.9] URTI (acute upper respiratory infection) [465.9] Acute renal insufficiency [593.9] Atrial fibrillation with rapid ventricular response [427.31]  Discharge Diagnosis   diarrhea induced dehydration causing acute renal failure, atrial fibrillation  Principal Problem:   Atrial fibrillation with RVR Active Problems:   HYPERTENSION   URTI (acute upper respiratory infection)   ARF (acute renal failure)   Malnutrition of moderate degree      Past Medical History  Diagnosis Date  . Hypertension   . Hypothyroid   . Reflux   . Kidney stone   . Polymyalgia rheumatica   . Insomnia   . Pressure ulcer   . Elevated PSA     Past Surgical History  Procedure Laterality Date  . Appendectomy    . Cholecystectomy    . Thyroidectomy    . Tonsillectomy       Discharge Condition: Stable       Follow-up Information   Follow up with Jeffrey AsaLUKING,W S, MD. Schedule an appointment as soon as possible for a visit in 1 week.   Specialty:  Family Medicine   Contact information:   [address] [place] [address] [phone]       Follow up with Jeffrey RichBRANCH, JONATHAN, F, MD. Schedule an appointment as soon as possible for a visit in 1 week.   Specialty:  Cardiology   Contact information:   [address] [place] [address] [phone]       Please follow up. (TSH checked)         Consults obtained - Cardiology   Discharge Medications      Medication List    STOP taking these  medications       levofloxacin 500 MG tablet  Commonly known as:  LEVAQUIN      TAKE these medications       aspirin 325 MG EC tablet  Take 1 tablet (325 mg total) by mouth daily.     benazepril-hydrochlorthiazide 20-12.5 MG per tablet  Commonly known as:  LOTENSIN HCT  Take 1 tablet by mouth daily.     digoxin 0.125 MG tablet  Commonly known as:  LANOXIN  Take 1 tablet (0.125 mg total) by mouth every other day.  Start taking on:  02/04/2013     doxycycline 100 MG tablet  Commonly known as:  VIBRA-TABS  Take 1 tablet (100 mg total) by mouth every 12 (twelve) hours.     esomeprazole 40 MG capsule  Commonly known as:  NEXIUM  Take 1 capsule (40 mg total) by mouth 2 (two) times daily before a meal.  feeding supplement (ENSURE COMPLETE) Liqd  Take 237 mLs by mouth 2 (two) times daily between meals.     guaiFENesin-dextromethorphan 100-10 MG/5ML syrup  Commonly known as:  ROBITUSSIN DM  Take 5 mLs by mouth every 4 (four) hours as needed for cough.     loperamide 2 MG capsule  Commonly known as:  IMODIUM  Take 1 capsule (2 mg total) by mouth every 4 (four) hours as needed for diarrhea or loose stools.     triamcinolone ointment 0.1 %  Commonly known as:  KENALOG  Apply 1 application topically 2 (two) times daily.         Diet and Activity recommendation: See Discharge Instructions below   Discharge Instructions     Follow with Primary MD Jeffrey Asa, MD in 7 days   Get CBC, CMP, TSH checked 7 days by Primary MD and again as instructed by your Primary MD. Get a 2 view Chest X ray done next visit.   Activity: As tolerated with Full fall precautions use walker/cane & assistance as needed   Disposition Home     Diet:  Heart Healthy.  For Heart failure patients - Check your Weight same time everyday, if you gain over 2 pounds, or you develop in leg swelling, experience more shortness of breath or chest pain, call your Primary MD immediately. Follow Cardiac Low  Salt Diet and 1.8 lit/day fluid restriction.   On your next visit with her primary care physician please Get Medicines reviewed and adjusted.  Please request your Prim.MD to go over all Hospital Tests and Procedure/Radiological results at the follow up, please get all Hospital records sent to your Prim MD by signing hospital release before you go home.   If you experience worsening of your admission symptoms, develop shortness of breath, life threatening emergency, suicidal or homicidal thoughts you must seek medical attention immediately by calling 911 or calling your MD immediately  if symptoms less severe.  You Must read complete instructions/literature along with all the possible adverse reactions/side effects for all the Medicines you take and that have been prescribed to you. Take any new Medicines after you have completely understood and accpet all the possible adverse reactions/side effects.   Do not drive and provide baby sitting services if your were admitted for syncope or siezures until you have seen by Primary MD or a Neurologist and advised to do so again.  Do not drive when taking Pain medications.    Do not take more than prescribed Pain, Sleep and Anxiety Medications  Special Instructions: If you have smoked or chewed Tobacco  in the last 2 yrs please stop smoking, stop any regular Alcohol  and or any Recreational drug use.  Wear Seat belts while driving.   Please note  You were cared for by a hospitalist during your hospital stay. If you have any questions about your discharge medications or the care you received while you were in the hospital after you are discharged, you can call the unit and asked to speak with the hospitalist on call if the hospitalist that took care of you is not available. Once you are discharged, your primary care physician will handle any further medical issues. Please note that NO REFILLS for any discharge medications will be authorized once you  are discharged, as it is imperative that you return to your primary care physician (or establish a relationship with a primary care physician if you do not have one) for your aftercare needs so that  they can reassess your need for medications and monitor your lab values.     Major procedures and Radiology Reports - PLEASE review detailed and final reports for all details, in brief -   Echo  - Moderate basal septal hypertrophy with LVEF 55-60%, basal inferolateral akinesis, grade 2 diastolic dysfunction. Moderate left atrial enlargement. MAC with mild mitral regurgitation. Aortic valve is not well visualized, moderately calcified and with evidence of stenosis. Interrogation is limited and inconclusive in terms of degree of stenosis - gradients suggest mild to moderate, and the LVOT/AV ratio suggests possibly severe. Would recommend a repeat limited study to better interrogate the aortic valve. Trivial aortic regurgitation. Unable to assess PASP, the RA-RV gradient is elevated at 42 mmHg suggesting possible moderate pulmonary hypertension.     Ct Abdomen Pelvis Wo Contrast  01/31/2013   CLINICAL DATA:  Right-sided abdominal pain. Urolithiasis. Previous appendectomy and cholecystectomy.  EXAM: CT ABDOMEN AND PELVIS WITHOUT CONTRAST  TECHNIQUE: Multidetector CT imaging of the abdomen and pelvis was performed following the standard protocol without intravenous contrast.  COMPARISON:  03/06/2007  FINDINGS: Small to moderate hiatal hernia again seen. Surgical clips from prior cholecystectomy noted. Tiny sub-cm low-attenuation lesion in the anterior left hepatic lobe is stable and consistent with tiny benign cyst. No other liver lesions identified in this noncontrast study. The pancreas, spleen, and adrenal glands are normal in appearance. Renal cysts are noted bilaterally however there is no evidence of renal calculi or hydronephrosis. No evidence of ureteral calculi or dilatation.  Moderately enlarged  prostate gland is again seen with mass effect on bladder base. Several bladder diverticula again seen, consistent with chronic bladder outlet obstruction. No bladder calculi identified.  No evidence of abdominal or pelvic lymphadenopathy. No evidence of acute inflammatory process or abnormal fluid collections. No evidence of dilated bowel loops or bowel wall thickening. An old L1 vertebral body compression fracture is noted, which is new since 2009 exam.  IMPRESSION: No evidence of urolithiasis, hydronephrosis, or other acute findings.  Small to moderate hiatal hernia.  Moderately enlarged prostate, with findings of chronic bladder outlet obstruction.   Electronically Signed   By: Myles Rosenthal M.D.   On: 01/31/2013 17:49   Dg Chest 2 View  01/31/2013   CLINICAL DATA:  Cough congestion in shortness of breath  EXAM: CHEST  2 VIEW  COMPARISON:  Chest radiograph 04/17/2007 and CT abdomen and pelvis 01/31/2013. CT abdomen pelvis 2009.  FINDINGS: The heart is mildly enlarged. Aorta slightly tortuous. Hilar contours within normal limits. Patient is kyphotic and the lungs are markedly hyperinflated, with flattening of the diaphragms. There is slight peribronchial thickening at the lung bases. No focal airspace disease or pulmonary edema. Negative for pneumothorax or pleural effusion. There is gaseous distention of the esophagus.  There is a severe compression deformity of the L1 vertebral body. This appears new compared to CT abdomen pelvis of 2009.  IMPRESSION: 1. Marked hyperinflation with peribronchial thickening at the lung bases. Findings likely reflect COPD. Superimposed acute bronchitis not excluded. 2. Gaseous distention of the esophagus. This can be seen in the setting of gastroesophageal reflux, esophageal dysmotility, or distal esophageal narrowing. 3. Cardiomegaly. 4. Severe L1 compression fracture.   Electronically Signed   By: Britta Mccreedy M.D.   On: 01/31/2013 17:54   Nm Pulmonary Perf And  Vent  02/01/2013   CLINICAL DATA:  Hypoxia.  Question pulmonary embolism.  EXAM: NUCLEAR MEDICINE VENTILATION - PERFUSION LUNG SCAN  TECHNIQUE: Ventilation images were obtained in  multiple projections using inhaled aerosol technetium 99 M DTPA. Perfusion images were obtained in multiple projections after intravenous injection of Tc-37m MAA.  COMPARISON:  Chest radiographs and abdominal CT 01/31/2013.  RADIOPHARMACEUTICALS:  40.0 mCi Tc-53m DTPA aerosol and 6.0 mCi Tc-62m MAA  FINDINGS: Ventilation: There is clumping of the aerosol within the central tracheobronchial tree. There is patchy ventilation in both lungs consistent with chronic obstructive pulmonary disease.  Perfusion: The perfusion is similar to the ventilatory examination with multiple small peripheral perfusion defects attributed to chronic obstructive pulmonary disease. No ventilation/perfusion mismatches are identified to suggest acute pulmonary embolism.  IMPRESSION: 1. Low probability for acute pulmonary embolism. 2. Matched ventilation and perfusion defects in both lungs consistent with chronic obstructive pulmonary disease.   Electronically Signed   By: Roxy Horseman M.D.   On: 02/01/2013 09:21    Micro Results      Recent Results (from the past 240 hour(Mccarty))  CLOSTRIDIUM DIFFICILE BY PCR     Status: None   Collection Time    02/01/13  6:50 AM      Result Value Range Status   C difficile by pcr NEGATIVE  NEGATIVE Final     History of present illness and  Hospital Course:     Kindly see H&P for history of present illness and admission details, please review complete Labs, Consult reports and Test reports for all details in brief Jeffrey Mccarty, is a 80 y.o. male, patient with history of  hearing loss bilaterally, BPH on CT scan, GERD, admitted to the hospital with chief complaints of cough with intense epigastric burning and diarrhea. He likely had mild URI viral versus bacterial, with worsening of GERD/reflux, he responded well to  high dose PPI, doxycycline and Imodium. He also went into new onset atrial fibrillation this admission.   Nonspecific epigastric burning/abdominal discomfort, likely suggestive of worsening of his underlying GERD and peptic ulcer disease, he had stable CT scan of the abdomen pelvis, responded well to high dose PPI on which she will be discharged home.   Nonspecific diarrhea likely due to viral gastroenteritis, diarrhea is much better, C. difficile negative, when necessary Imodium as needed.   New onset atrial fibrillation with RVR. Converted to sinus, on every 48 hour digoxin per cardiology, seen by cardiology, echogram stable, family does not wish anticoagulation, will be placed on full dose aspirin as suggested by cardiology, will follow with cardiology in the outpatient setting post discharge. He had some nonspecific findings an echogram which he will follow with his cardiologist post discharge.    Note his TSH was 0.017, free T3 and T4 were stable, we'll request PCP to repeat TSH, free T3 and T4 in a week.    His abdominal CT scan was suggested with possible BPH, he is allergic to a blocker, no signs of urine retention, will defer to PCP on outpatient followup with urology if needed.    Questionable mild bronchitis on chest x-ray. Does have mild cough, much improved on doxycycline and cough medication which will be continued.       Today   Subjective:   Jeffrey Mccarty today has no headache,no chest abdominal pain,no new weakness tingling or numbness, feels much better wants to go home today.    Objective:   Blood pressure 108/62, pulse 82, temperature 98.4 [place] (36.9 C), temperature source Oral, resp. rate 20, height 5\' 2"  (1.575 m), weight 46.3 kg (102 lb 1.2 oz), SpO2 95.00%.  No intake or output data in the  24 hours ending 02/03/13 1251  Exam Awake Alert, Oriented *3, No new [place].N deficits, Normal affect [place].AT,PERRAL Supple Neck,No JVD, No cervical lymphadenopathy  appriciated.  Symmetrical Chest wall movement, Good air movement bilaterally, CTAB RRR,No Gallops,Rubs or new Murmurs, No Parasternal Heave +ve B.Sounds, Abd Soft, Non tender, No organomegaly appriciated, No rebound -guarding or rigidity. No Cyanosis, Clubbing or edema, No new Rash or bruise  Data Review   CBC w Diff:  Lab Results  Component Value Date   WBC 9.5 02/01/2013   HGB 11.5* 02/01/2013   HCT 33.3* 02/01/2013   PLT 343 02/01/2013   LYMPHOPCT 12 01/31/2013   MONOPCT 8 01/31/2013   EOSPCT 3 01/31/2013   BASOPCT 1 01/31/2013    CMP:  Lab Results  Component Value Date   NA 136* 02/03/2013   K 4.0 02/03/2013   CL 104 02/03/2013   CO2 22 02/03/2013   BUN 23 02/03/2013   CREATININE 1.32 02/03/2013   PROT 6.4 02/01/2013   ALBUMIN 3.0* 02/01/2013   BILITOT 0.4 02/01/2013   ALKPHOS 46 02/01/2013   AST 24 02/01/2013   ALT 12 02/01/2013  .   Total Time in preparing paper work, data evaluation and todays exam - 35 minutes  Leroy Sea M.D on 02/03/2013 at 12:51 PM  [facility] Office  [phone]

## 2013-02-03 NOTE — Progress Notes (Signed)
D/c instructions reviewed with patient's daughter. Verbalized understanding. Pt dc'd to home with daughter. Schonewitz, Candelaria StagersLeigh Anne 02/03/2013

## 2013-02-03 NOTE — Discharge Instructions (Signed)
Follow with Primary MD Jeffrey Mccarty,W S, MD in 7 days   Get CBC, CMP, TSH checked 7 days by Primary MD and again as instructed by your Primary MD. Get a 2 view Chest X ray done next visit.   Activity: As tolerated with Full fall precautions use walker/cane & assistance as needed   Disposition Home     Diet:  Heart Healthy.  For Heart failure patients - Check your Weight same time everyday, if you gain over 2 pounds, or you develop in leg swelling, experience more shortness of breath or chest pain, call your Primary MD immediately. Follow Cardiac Low Salt Diet and 1.8 lit/day fluid restriction.   On your next visit with her primary care physician please Get Medicines reviewed and adjusted.  Please request your Prim.MD to go over all Hospital Tests and Procedure/Radiological results at the follow up, please get all Hospital records sent to your Prim MD by signing hospital release before you go home.   If you experience worsening of your admission symptoms, develop shortness of breath, life threatening emergency, suicidal or homicidal thoughts you must seek medical attention immediately by calling 911 or calling your MD immediately  if symptoms less severe.  You Must read complete instructions/literature along with all the possible adverse reactions/side effects for all the Medicines you take and that have been prescribed to you. Take any new Medicines after you have completely understood and accpet all the possible adverse reactions/side effects.   Do not drive and provide baby sitting services if your were admitted for syncope or siezures until you have seen by Primary MD or a Neurologist and advised to do so again.  Do not drive when taking Pain medications.    Do not take more than prescribed Pain, Sleep and Anxiety Medications  Special Instructions: If you have smoked or chewed Tobacco  in the last 2 yrs please stop smoking, stop any regular Alcohol  and or any Recreational drug  use.  Wear Seat belts while driving.   Please note  You were cared for by a hospitalist during your hospital stay. If you have any questions about your discharge medications or the care you received while you were in the hospital after you are discharged, you can call the unit and asked to speak with the hospitalist on call if the hospitalist that took care of you is not available. Once you are discharged, your primary care physician will handle any further medical issues. Please note that NO REFILLS for any discharge medications will be authorized once you are discharged, as it is imperative that you return to your primary care physician (or establish a relationship with a primary care physician if you do not have one) for your aftercare needs so that they can reassess your need for medications and monitor your lab values.

## 2013-02-09 ENCOUNTER — Emergency Department (HOSPITAL_COMMUNITY)
Admission: EM | Admit: 2013-02-09 | Discharge: 2013-02-09 | Disposition: A | Discharge status: Home / Self Care | Payer: Medicare HMO | Attending: Emergency Medicine | Admitting: Emergency Medicine

## 2013-02-09 ENCOUNTER — Encounter (HOSPITAL_COMMUNITY): Payer: Self-pay | Admitting: Emergency Medicine

## 2013-02-09 DIAGNOSIS — R109 Unspecified abdominal pain: Secondary | ICD-10-CM | POA: Insufficient documentation

## 2013-02-09 DIAGNOSIS — Z862 Personal history of diseases of the blood and blood-forming organs and certain disorders involving the immune mechanism: Secondary | ICD-10-CM | POA: Insufficient documentation

## 2013-02-09 DIAGNOSIS — R319 Hematuria, unspecified: Secondary | ICD-10-CM | POA: Insufficient documentation

## 2013-02-09 DIAGNOSIS — I10 Essential (primary) hypertension: Secondary | ICD-10-CM | POA: Insufficient documentation

## 2013-02-09 DIAGNOSIS — Z79899 Other long term (current) drug therapy: Secondary | ICD-10-CM | POA: Insufficient documentation

## 2013-02-09 DIAGNOSIS — K219 Gastro-esophageal reflux disease without esophagitis: Secondary | ICD-10-CM | POA: Insufficient documentation

## 2013-02-09 DIAGNOSIS — Z8669 Personal history of other diseases of the nervous system and sense organs: Secondary | ICD-10-CM | POA: Insufficient documentation

## 2013-02-09 DIAGNOSIS — I4891 Unspecified atrial fibrillation: Secondary | ICD-10-CM | POA: Insufficient documentation

## 2013-02-09 DIAGNOSIS — Z7982 Long term (current) use of aspirin: Secondary | ICD-10-CM | POA: Insufficient documentation

## 2013-02-09 DIAGNOSIS — Z792 Long term (current) use of antibiotics: Secondary | ICD-10-CM | POA: Insufficient documentation

## 2013-02-09 DIAGNOSIS — Z8739 Personal history of other diseases of the musculoskeletal system and connective tissue: Secondary | ICD-10-CM | POA: Insufficient documentation

## 2013-02-09 DIAGNOSIS — N289 Disorder of kidney and ureter, unspecified: Secondary | ICD-10-CM | POA: Insufficient documentation

## 2013-02-09 DIAGNOSIS — Z87442 Personal history of urinary calculi: Secondary | ICD-10-CM | POA: Insufficient documentation

## 2013-02-09 DIAGNOSIS — Z872 Personal history of diseases of the skin and subcutaneous tissue: Secondary | ICD-10-CM | POA: Insufficient documentation

## 2013-02-09 DIAGNOSIS — Z8639 Personal history of other endocrine, nutritional and metabolic disease: Secondary | ICD-10-CM | POA: Insufficient documentation

## 2013-02-09 HISTORY — DX: Unspecified atrial fibrillation: I48.91

## 2013-02-09 LAB — CBC
HCT: 28.8 % — ABNORMAL LOW (ref 39.0–52.0)
Hemoglobin: 10.9 g/dL — ABNORMAL LOW (ref 13.0–17.0)
MCH: 33.4 pg (ref 26.0–34.0)
MCHC: 37.8 g/dL — ABNORMAL HIGH (ref 30.0–36.0)
MCV: 88.3 fL (ref 78.0–100.0)
PLATELETS: 346 10*3/uL (ref 150–400)
RBC: 3.26 MIL/uL — AB (ref 4.22–5.81)
RDW: 14 % (ref 11.5–15.5)
WBC: 7.1 10*3/uL (ref 4.0–10.5)

## 2013-02-09 LAB — URINALYSIS, ROUTINE W REFLEX MICROSCOPIC
BILIRUBIN URINE: NEGATIVE
Glucose, UA: NEGATIVE mg/dL
KETONES UR: NEGATIVE mg/dL
LEUKOCYTES UA: NEGATIVE
Nitrite: NEGATIVE
Protein, ur: NEGATIVE mg/dL
Specific Gravity, Urine: 1.01 (ref 1.005–1.030)
UROBILINOGEN UA: 0.2 mg/dL (ref 0.0–1.0)
pH: 7 (ref 5.0–8.0)

## 2013-02-09 LAB — URINE MICROSCOPIC-ADD ON

## 2013-02-09 LAB — PROTIME-INR
INR: 1.21 (ref 0.00–1.49)
PROTHROMBIN TIME: 15 s (ref 11.6–15.2)

## 2013-02-09 LAB — APTT: APTT: 35 s (ref 24–37)

## 2013-02-09 LAB — BASIC METABOLIC PANEL
BUN: 36 mg/dL — ABNORMAL HIGH (ref 6–23)
CO2: 31 meq/L (ref 19–32)
CREATININE: 1.39 mg/dL — AB (ref 0.50–1.35)
Calcium: 9.1 mg/dL (ref 8.4–10.5)
Chloride: 94 mEq/L — ABNORMAL LOW (ref 96–112)
GFR calc Af Amer: 50 mL/min — ABNORMAL LOW (ref >90)
GFR, EST NON AFRICAN AMERICAN: 43 mL/min — AB (ref >90)
Glucose, Bld: 98 mg/dL (ref 70–99)
Potassium: 4.5 mEq/L (ref 3.7–5.3)
SODIUM: 136 meq/L — AB (ref 137–147)

## 2013-02-09 LAB — DIGOXIN LEVEL: Digoxin Level: 0.9 ng/mL (ref 0.8–2.0)

## 2013-02-09 MED ORDER — CEPHALEXIN 500 MG PO CAPS: 500.0000 mg | ORAL_CAPSULE | Freq: Four times a day (QID) | ORAL | Status: DC

## 2013-02-09 MED ORDER — CEPHALEXIN 500 MG PO CAPS
500.0000 mg | ORAL_CAPSULE | Freq: Once | ORAL | Status: AC
  Administered 2013-02-09: 500 mg via ORAL
  Filled 2013-02-09: qty 1

## 2013-02-09 MED ORDER — ETOMIDATE 2 MG/ML IV SOLN
INTRAVENOUS | Status: AC
  Filled 2013-02-09: qty 20

## 2013-02-09 MED ORDER — LIDOCAINE HCL (CARDIAC) 20 MG/ML IV SOLN
INTRAVENOUS | Status: AC
  Filled 2013-02-09: qty 5

## 2013-02-09 MED ORDER — SUCCINYLCHOLINE CHLORIDE 20 MG/ML IJ SOLN
INTRAMUSCULAR | Status: AC
  Filled 2013-02-09: qty 1

## 2013-02-09 MED ORDER — ROCURONIUM BROMIDE 50 MG/5ML IV SOLN
INTRAVENOUS | Status: AC
  Filled 2013-02-09: qty 2

## 2013-02-09 NOTE — ED Notes (Signed)
Patient complaining of "peeing blood" and lower abdominal pain starting approximately 1 hour ago.

## 2013-02-09 NOTE — ED Provider Notes (Signed)
CSN: [id number]     Arrival date & time 02/09/13  0304 History   First MD Initiated Contact with Patient 02/09/13 0320     Chief Complaint  Patient presents with  . Hematuria   (Consider location/radiation/quality/duration/timing/severity/associated sxs/prior Treatment) HPI Comments: The patient is an 80 year old male who was recently admitted to the hospital after diarrhea he had an acute upper respiratory infection causing to be dehydrated, have renal insufficiency, and to have new onset atrial fibrillation. When he was discharged from the hospital his kidneys had improved, his atrial fibrillation was being treated with digoxin and he was started on a baby aspirin. He has finished a course of doxycycline over the last week and has improved with his prior symptoms. Today approximately one hour prior to arrival he developed acute onset of hematuria with lower abdominal discomfort. This abdominal discomfort has resolved, his hematuria persists and is worse with urination. He denies having any trauma or injuries to his penis, denies any other new anticoagulants other than aspirin. He has not had any dysuria prior to this episode and denies fevers chills nausea or vomiting.  Of note the daughter states that she has been giving the patient daily digoxin, the prescription was supposed to be every other day, she did not notice this at her until last night. He has not had any complaints of chest pain, weakness, difficulty breathing or shortness of breath.  Patient is a 80 y.o. male presenting with hematuria. The history is provided by the patient, a relative and medical records.  Hematuria    Past Medical History  Diagnosis Date  . Hypertension   . Hypothyroid   . Reflux   . Kidney stone   . Polymyalgia rheumatica   . Insomnia   . Pressure ulcer   . Elevated PSA   . Atrial fibrillation    Past Surgical History  Procedure Laterality Date  . Appendectomy    . Cholecystectomy    .  Thyroidectomy    . Tonsillectomy     Family History  Problem Relation Age of Onset  . Heart attack Father    History  Substance Use Topics  . Smoking status: Never Smoker   . Smokeless tobacco: Not on file  . Alcohol Use: No    Review of Systems  Genitourinary: Positive for hematuria.  All other systems reviewed and are negative.    Allergies  Cardura  Home Medications   Current Outpatient Rx  Name  Route  Sig  Dispense  Refill  . aspirin EC 325 MG EC tablet   Oral   Take 1 tablet (325 mg total) by mouth daily.   30 tablet   0   . benazepril-hydrochlorthiazide (LOTENSIN HCT) 20-12.5 MG per tablet   Oral   Take 1 tablet by mouth daily.   90 tablet   2   . digoxin (LANOXIN) 0.125 MG tablet   Oral   Take 1 tablet (0.125 mg total) by mouth every other day.   15 tablet   0   . doxycycline (VIBRA-TABS) 100 MG tablet   Oral   Take 1 tablet (100 mg total) by mouth every 12 (twelve) hours.   8 tablet   0   . esomeprazole (NEXIUM) 40 MG capsule   Oral   Take 1 capsule (40 mg total) by mouth 2 (two) times daily before a meal.   60 capsule   2   . feeding supplement, ENSURE COMPLETE, (ENSURE COMPLETE) LIQD   Oral  Take 237 mLs by mouth 2 (two) times daily between meals.   30 Bottle   1   . guaiFENesin-dextromethorphan (ROBITUSSIN DM) 100-10 MG/5ML syrup   Oral   Take 5 mLs by mouth every 4 (four) hours as needed for cough.   118 mL   0   . loperamide (IMODIUM) 2 MG capsule   Oral   Take 1 capsule (2 mg total) by mouth every 4 (four) hours as needed for diarrhea or loose stools.   30 capsule   0   . triamcinolone ointment (KENALOG) 0.1 %   Topical   Apply 1 application topically 2 (two) times daily.   60 g   3   . cephALEXin (KEFLEX) 500 MG capsule   Oral   Take 1 capsule (500 mg total) by mouth 4 (four) times daily.   40 capsule   0    BP 124/58  Pulse 72  Temp(Src) 97.5 F (36.4 C) (Oral)  Resp 22  Ht 5\' 2"  (1.575 m)  Wt 104 lb  (47.174 kg)  BMI 19.02 kg/m2  SpO2 96% Physical Exam  Nursing note and vitals reviewed. Constitutional: He appears well-developed and well-nourished. No distress.  HENT:  Head: Normocephalic and atraumatic.  Mouth/Throat: Oropharynx is clear and moist. No oropharyngeal exudate.  Eyes: Conjunctivae and EOM are normal. Pupils are equal, round, and reactive to light. Right eye exhibits no discharge. Left eye exhibits no discharge. No scleral icterus.  Neck: Normal range of motion. Neck supple. No JVD present. No thyromegaly present.  Cardiovascular: Normal rate, normal heart sounds and intact distal pulses.  An irregularly irregular rhythm present. Exam reveals no gallop and no friction rub.   No murmur heard. Pulmonary/Chest: Effort normal and breath sounds normal. No respiratory distress. He has no wheezes. He has no rales.  Barrel chested, normal breath sounds, no distress  Abdominal: Soft. Bowel sounds are normal. He exhibits no distension and no mass. There is no tenderness.  No abdominal tenderness, very soft, no pelvic tenderness, no pain and a brief point, no suprapubic tenderness  Genitourinary:  Normal-appearing circumcised penis and testicles, normal scrotum, small amount of blood at the urethral meatus.  Musculoskeletal: Normal range of motion. He exhibits no edema and no tenderness.  Lymphadenopathy:    He has no cervical adenopathy.  Neurological: He is alert. Coordination normal.  Skin: Skin is warm and dry. No rash noted. No erythema.  Psychiatric: He has a normal mood and affect. His behavior is normal.    ED Course  Procedures (including critical care time) Labs Review Labs Reviewed  URINALYSIS, ROUTINE W REFLEX MICROSCOPIC - Abnormal; Notable for the following:    Hgb urine dipstick LARGE (*)    All other components within normal limits  BASIC METABOLIC PANEL - Abnormal; Notable for the following:    Sodium 136 (*)    Chloride 94 (*)    BUN 36 (*)    Creatinine,  Ser 1.39 (*)    GFR calc non Af Amer 43 (*)    GFR calc Af Amer 50 (*)    All other components within normal limits  CBC - Abnormal; Notable for the following:    RBC 3.26 (*)    Hemoglobin 10.9 (*)    HCT 28.8 (*)    MCHC 37.8 (*)    All other components within normal limits  URINE CULTURE  DIGOXIN LEVEL  PROTIME-INR  APTT  URINE MICROSCOPIC-ADD ON   Imaging Review No results found.  EKG Interpretation   None       MDM   1. Hematuria    The patient has new onset hematuria, the only medication that is changed is that he is now on aspirin albeit a small amount daily 325 mg a day. He has been accidentally taking too much digoxin, will check a digoxin level, a laboratory workup, he is not having any cardiac or pulmonary symptoms to suggest that he is having digoxin toxicity. I would consider coagulopathy, urinary tract infection, bladder pathology such as vascular bleeding or cancer less likely. Will recheck basic metabolic to ensure that kidneys are improving, check digoxin level, urinalysis pending  Digoxin level normal, basic metabolic panel shows close to baseline renal function, Keflex given for likely urinary infection, discussed with family member and patient regarding urology followup. He has seen urologist in the past because of prostate hypertrophy, will followup as outpatient, passing urine without difficulty, no pain at this time.   Meds given in ED:  Medications  cephALEXin (KEFLEX) capsule 500 mg (500 mg Oral Given 02/09/13 0418)    New Prescriptions   CEPHALEXIN (KEFLEX) 500 MG CAPSULE    Take 1 capsule (500 mg total) by mouth 4 (four) times daily.      Vida RollerBrian D Malavika Lira, MD 02/09/13 [phone]

## 2013-02-09 NOTE — Discharge Instructions (Signed)
Please call your doctor for a followup appointment within 24-48 hours. When you talk to your doctor please let them know that you were seen in the emergency department and have them acquire all of your records so that they can discuss the findings with you and formulate a treatment plan to fully care for your new and ongoing problems.  If the bleeding does not resolve you MUST see your doctor or Dr. Jerre SimonJavaid for further testing.

## 2013-02-10 ENCOUNTER — Encounter: Payer: Self-pay | Admitting: Family Medicine

## 2013-02-10 ENCOUNTER — Ambulatory Visit (INDEPENDENT_AMBULATORY_CARE_PROVIDER_SITE_OTHER): Payer: Medicare HMO | Admitting: Family Medicine

## 2013-02-10 VITALS — BP 122/82 | Ht 61.25 in | Wt 107.8 lb

## 2013-02-10 DIAGNOSIS — J069 Acute upper respiratory infection, unspecified: Secondary | ICD-10-CM

## 2013-02-10 DIAGNOSIS — I10 Essential (primary) hypertension: Secondary | ICD-10-CM

## 2013-02-10 DIAGNOSIS — I4891 Unspecified atrial fibrillation: Secondary | ICD-10-CM

## 2013-02-10 DIAGNOSIS — E0590 Thyrotoxicosis, unspecified without thyrotoxic crisis or storm: Secondary | ICD-10-CM

## 2013-02-10 LAB — URINE CULTURE
CULTURE: NO GROWTH
Colony Count: NO GROWTH

## 2013-02-10 NOTE — Progress Notes (Signed)
   Subjective:    Patient ID: Jeffrey EdelsonRoy C Minnich, male    DOB: 1921-11-30, 80 y.o.   MRN: [id number]  HPI Patient arrives for a follow up from hospitalization for dehydration, renal failure and a fib. Patient also had to go to hospital the night before last and was diagnosed with UTI and put on antibiotic. Patient and family upset because hospital doctors insist he go to assisted living facility. Patient also reports swelling in feet.   At first it was a mistake with Lanoxin does now medication is figured out. Patient tolerating it well. Following up with cardiologist. They recommended a full aspirin no Coumadin anticoagulation.  Patient reports overall feeling well no chest pain no shortness breath no back pain no abdominal pain energy level improving.  Blood work revealed a suggestion of hyperthyroidism. They were unaware this is a problem for the patient. We have referred him to the endocrinologist in the past. He has not seen them recently apparently lost to followup. Does note some sweatiness. At times.   Review of Systems No chest pain no back pain no headache no abdominal pain no change in bowel habits    Objective:   Physical Exam Alert no apparent distress. HEENT normal. Lungs clear. Heart rhythm is irregular but good control not tachycardia ankles trace edema at most lungs clear abdomen benign thyroid nonpalpable       Assessment & Plan:  Impression 1H her fibrillation good control. #2 chronic hyper thyroidism question if related discussed #3 hypertension good control. #4 status post UTI clinically improved. #5 reflux stable plan encouraged to get back with endocrinologist. Maintain other medications. Followup with cardiologist as scheduled. Diet exercise discussed. Keep close eye on weight. Recheck in several months. 35 minutes spent most in discussion WSL

## 2013-02-15 DIAGNOSIS — E0590 Thyrotoxicosis, unspecified without thyrotoxic crisis or storm: Secondary | ICD-10-CM | POA: Insufficient documentation

## 2013-02-17 ENCOUNTER — Telehealth: Payer: Self-pay | Admitting: *Deleted

## 2013-02-17 ENCOUNTER — Ambulatory Visit (INDEPENDENT_AMBULATORY_CARE_PROVIDER_SITE_OTHER): Payer: Medicare HMO | Admitting: Cardiology

## 2013-02-17 ENCOUNTER — Encounter: Payer: Self-pay | Admitting: Cardiology

## 2013-02-17 VITALS — BP 117/40 | HR 71 | Ht 62.0 in | Wt 107.0 lb

## 2013-02-17 DIAGNOSIS — I5189 Other ill-defined heart diseases: Secondary | ICD-10-CM

## 2013-02-17 DIAGNOSIS — I519 Heart disease, unspecified: Secondary | ICD-10-CM

## 2013-02-17 DIAGNOSIS — R319 Hematuria, unspecified: Secondary | ICD-10-CM

## 2013-02-17 DIAGNOSIS — I350 Nonrheumatic aortic (valve) stenosis: Secondary | ICD-10-CM

## 2013-02-17 DIAGNOSIS — I359 Nonrheumatic aortic valve disorder, unspecified: Secondary | ICD-10-CM

## 2013-02-17 DIAGNOSIS — I4891 Unspecified atrial fibrillation: Secondary | ICD-10-CM

## 2013-02-17 DIAGNOSIS — I10 Essential (primary) hypertension: Secondary | ICD-10-CM

## 2013-02-17 MED ORDER — DIGOXIN 125 MCG PO TABS: 0.1250 mg | ORAL_TABLET | ORAL | Status: DC

## 2013-02-17 MED ORDER — BENAZEPRIL HCL 5 MG PO TABS: 5.0000 mg | ORAL_TABLET | Freq: Every day | ORAL | Status: DC

## 2013-02-17 MED ORDER — HYDROCHLOROTHIAZIDE 12.5 MG PO CAPS: 12.5000 mg | ORAL_CAPSULE | Freq: Every day | ORAL | Status: DC

## 2013-02-17 NOTE — Patient Instructions (Signed)
Your physician recommends that you schedule a follow-up appointment in: 3 MONTHS  Your physician has recommended you make the following change in your medication:   1) STOP TAKING BENAZAPRIL/HCTZ COMBINATION PILL 2) START BENAZEPRIL 5MG  ONCE DAILY 3) START HCTZ (HYDROCHLORATHIZIDE) 12.5MG  ONCE DAILY

## 2013-02-17 NOTE — Progress Notes (Signed)
Clinical Summary [name] is a 80 y.o.male seen today for hospital follow up.  1. Afib - recent admit Jan 2015 with afib with RVR, converted with IV diltiazem and was transitioned to oral metoprolol and digoxin.  - CHADS2 score of 2, after discussion while inhospital patient elected for ASA and not antigcoag. Concerned about his bleeding risk and history of GI bleeds.  - man -no palpitations, no chest pain, no SOB  2. HTN - does not check at home  3. Mild to moderate aortic stenosis - poorly visuazlied on most recent echo, mean gradient of 21 mmHg suggestive of moderate but technically limited.   4. Diastolic dysfunction - grade 2 on most recent echo - denies any orthopnea, PND, no LE edema Past Medical History  Diagnosis Date  . Hypertension   . Hypothyroid   . Reflux   . Kidney stone   . Polymyalgia rheumatica   . Insomnia   . Pressure ulcer   . Elevated PSA   . Atrial fibrillation      Allergies  Allergen Reactions  . Cardura [Doxazosin Mesylate]      Current Outpatient Prescriptions  Medication Sig Dispense Refill  . aspirin EC 325 MG EC tablet Take 1 tablet (325 mg total) by mouth daily.  30 tablet  0  . benazepril-hydrochlorthiazide (LOTENSIN HCT) 20-12.5 MG per tablet Take 1 tablet by mouth daily.  90 tablet  2  . cephALEXin (KEFLEX) 500 MG capsule Take 1 capsule (500 mg total) by mouth 4 (four) times daily.  40 capsule  0  . digoxin (LANOXIN) 0.125 MG tablet Take 1 tablet (0.125 mg total) by mouth every other day.  15 tablet  0  . doxycycline (VIBRA-TABS) 100 MG tablet Take 1 tablet (100 mg total) by mouth every 12 (twelve) hours.  8 tablet  0  . esomeprazole (NEXIUM) 40 MG capsule Take 1 capsule (40 mg total) by mouth 2 (two) times daily before a meal.  60 capsule  2  . feeding supplement, ENSURE COMPLETE, (ENSURE COMPLETE) LIQD Take 237 mLs by mouth 2 (two) times daily between meals.  30 Bottle  1  . guaiFENesin-dextromethorphan (ROBITUSSIN DM)  100-10 MG/5ML syrup Take 5 mLs by mouth every 4 (four) hours as needed for cough.  118 mL  0  . loperamide (IMODIUM) 2 MG capsule Take 1 capsule (2 mg total) by mouth every 4 (four) hours as needed for diarrhea or loose stools.  30 capsule  0  . triamcinolone ointment (KENALOG) 0.1 % Apply 1 application topically 2 (two) times daily.  60 g  3   No current facility-administered medications for this visit.     Past Surgical History  Procedure Laterality Date  . Appendectomy    . Cholecystectomy    . Thyroidectomy    . Tonsillectomy       Allergies  Allergen Reactions  . Cardura [Doxazosin Mesylate]       Family History  Problem Relation Age of Onset  . Heart attack Father      Social History [name] reports that he has never smoked. He does not have any smokeless tobacco history on file. [name] reports that he does not drink alcohol.   Review of Systems CONSTITUTIONAL: No weight loss, fever, chills, weakness or fatigue.  HEENT: Eyes: No visual loss, blurred vision, double vision or yellow sclerae.No hearing loss, sneezing, congestion, runny nose or sore throat.  SKIN: No rash or itching.  CARDIOVASCULAR: per HPI  RESPIRATORY: No shortness of breath, cough or sputum.  GASTROINTESTINAL: No anorexia, nausea, vomiting or diarrhea. No abdominal pain or blood.  GENITOURINARY: No burning on urination, no polyuria NEUROLOGICAL: No headache, dizziness, syncope, paralysis, ataxia, numbness or tingling in the extremities. No change in bowel or bladder control.  MUSCULOSKELETAL: No muscle, back pain, joint pain or stiffness.  LYMPHATICS: No enlarged nodes. No history of splenectomy.  PSYCHIATRIC: No history of depression or anxiety.  ENDOCRINOLOGIC: No reports of sweating, cold or heat intolerance. No polyuria or polydipsia.  Marland Kitchen   Physical Examination p 71 bp 117/40 Wt 107 lbs BMI 20 Gen: resting comfortably, no acute distress HEENT: no scleral icterus, pupils equal  round and reactive, no palptable cervical adenopathy,  CV: RRR, 3/6 mid peaking systolic murmur RUSB, no JVD Resp: Clear to auscultation bilaterally GI: abdomen is soft, non-tender, non-distended, normal bowel sounds, no hepatosplenomegaly MSK: extremities are warm, no edema.  Skin: warm, no rash Neuro:  no focal deficits Psych: appropriate affect   Diagnostic Studies 01/2013 Echo LVEF 55-60%, akinesis basalinferolateral myocardium, grade II diastolic dysfunction,     Assessment and Plan  1. Afib - no current symptoms -  He has been managed on every other day digoxin, initially in the hospital he had low blood pressures which did not tolerate beta blockers or CCBs. It seems he was restarted on his HCTZ/benazepril combo at discharge however - continue ASA, family concerned about blood thinners and risk of bleeding and falls  2. HTN - low diastolic bp today, patient denies significant symptoms - given his age, reasonable bp goal is <150/90, at most <130/80 given some underlying renal dysfunction - will stop his benazepril/HCTZ combo, start lower dose benazepril at 5 mg as he does have some renal dysfunction and continue HCTZ 12.5mg  as he does have grade II diastolic dysfunction  3. Aortic stenosis - mild to moderate by recent echo, though technically limited - will repeat echo in a few months as there is technical concern that we may be underestimating severity based on available images.  - family is not in favor of potential intervention if disease progressed to that point  4. Diastolic dysfunction - grade II on most recent echo - appears euvolemic on exam, no signficant symptoms - continue daily HCTZ.    Antoine Poche, M.D., F.A.C.C.

## 2013-02-17 NOTE — Addendum Note (Signed)
Addended by: Derry LoryPULLIAM, Shandi Godfrey A on: 02/17/2013 09:41 AM   Modules accepted: Orders

## 2013-02-17 NOTE — Telephone Encounter (Signed)
Continued  RBCs on urine micro-no infection per culture-Dr. Brett CanalesSteve recommends referral to [facility] for further evaluation. Orders placed in Epic.

## 2013-02-24 ENCOUNTER — Telehealth: Payer: Self-pay | Admitting: Family Medicine

## 2013-02-24 NOTE — Telephone Encounter (Signed)
[employer] has the wrong doctor listed on the patient's ID card.  Pt's daughter [name] called to request the card be fixed.  This puts a hold on referrals to specialists.  Currently holding a Urology & Endocrinology.  Need to try to get retro auth for Cardiology.  Will check periodically on PCP listing on card and will do referrals as soon as I can.

## 2013-02-27 ENCOUNTER — Encounter: Payer: Self-pay | Admitting: Family Medicine

## 2013-03-02 ENCOUNTER — Other Ambulatory Visit (HOSPITAL_COMMUNITY): Payer: Self-pay | Admitting: "Endocrinology

## 2013-03-02 DIAGNOSIS — E0590 Thyrotoxicosis, unspecified without thyrotoxic crisis or storm: Secondary | ICD-10-CM

## 2013-03-04 ENCOUNTER — Encounter (HOSPITAL_COMMUNITY): Admission: RE | Admit: 2013-03-04 | Payer: Medicare HMO | Source: Ambulatory Visit

## 2013-03-05 ENCOUNTER — Telehealth: Payer: Self-pay | Admitting: Family Medicine

## 2013-03-05 ENCOUNTER — Encounter (HOSPITAL_COMMUNITY): Payer: Medicare HMO

## 2013-03-05 NOTE — Telephone Encounter (Signed)
[facility] NEXIUM for BID dose, please see paper chart, please advise

## 2013-03-16 ENCOUNTER — Encounter (HOSPITAL_COMMUNITY): Payer: Medicare HMO

## 2013-03-17 ENCOUNTER — Encounter (HOSPITAL_COMMUNITY): Payer: Medicare HMO

## 2013-03-23 ENCOUNTER — Encounter (HOSPITAL_COMMUNITY)
Admission: RE | Admit: 2013-03-23 | Discharge: 2013-03-23 | Disposition: A | Discharge status: Home / Self Care | Payer: Medicare HMO | Source: Ambulatory Visit | Attending: "Endocrinology | Admitting: "Endocrinology

## 2013-03-23 ENCOUNTER — Encounter (HOSPITAL_COMMUNITY): Payer: Self-pay

## 2013-03-23 DIAGNOSIS — E0590 Thyrotoxicosis, unspecified without thyrotoxic crisis or storm: Secondary | ICD-10-CM | POA: Insufficient documentation

## 2013-03-23 MED ORDER — SODIUM IODIDE I 131 CAPSULE
9.0000 | Freq: Once | INTRAVENOUS | Status: AC | PRN
  Administered 2013-03-23: 9 via ORAL

## 2013-03-24 ENCOUNTER — Encounter (HOSPITAL_COMMUNITY)
Admission: RE | Admit: 2013-03-24 | Discharge: 2013-03-24 | Disposition: A | Discharge status: Home / Self Care | Payer: Medicare HMO | Source: Ambulatory Visit | Attending: "Endocrinology | Admitting: "Endocrinology

## 2013-03-24 MED ORDER — SODIUM PERTECHNETATE TC 99M INJECTION
10.0000 | Freq: Once | INTRAVENOUS | Status: AC | PRN
  Administered 2013-03-24: 10 via INTRAVENOUS

## 2013-03-31 ENCOUNTER — Ambulatory Visit (INDEPENDENT_AMBULATORY_CARE_PROVIDER_SITE_OTHER): Payer: Medicare HMO | Admitting: Adult Health

## 2013-03-31 ENCOUNTER — Encounter: Payer: Self-pay | Admitting: Adult Health

## 2013-03-31 ENCOUNTER — Ambulatory Visit (INDEPENDENT_AMBULATORY_CARE_PROVIDER_SITE_OTHER): Payer: Medicare HMO | Admitting: Urology

## 2013-03-31 VITALS — BP 110/69 | HR 95 | Ht 62.0 in | Wt 112.0 lb

## 2013-03-31 DIAGNOSIS — I10 Essential (primary) hypertension: Secondary | ICD-10-CM

## 2013-03-31 DIAGNOSIS — N138 Other obstructive and reflux uropathy: Secondary | ICD-10-CM

## 2013-03-31 DIAGNOSIS — R000 Tachycardia, unspecified: Secondary | ICD-10-CM

## 2013-03-31 DIAGNOSIS — I4891 Unspecified atrial fibrillation: Secondary | ICD-10-CM

## 2013-03-31 DIAGNOSIS — N403 Nodular prostate with lower urinary tract symptoms: Secondary | ICD-10-CM

## 2013-03-31 DIAGNOSIS — R319 Hematuria, unspecified: Secondary | ICD-10-CM

## 2013-03-31 NOTE — Assessment & Plan Note (Signed)
Review of the patient's EKG demonstrated a heart rate of 95 beats per minute, normal sinus rhythm. I did review the notes sent over by his urologist, with vital signs stating his heart rate was 144 beats per minute, blood pressure is 142/61 at that time.  His daughters, who are in attendance, state that the patient was very anxious and emotional about returning to the urologist office as the procedure he had recently had was very painful to him. He did not sleep the night before and is easily anxious on follow up with urology. His daughter states that he can occasionally have rapid heart rhythm with anxiety. He denied that he is taking any extra caffeine in, and he is medically compliant of fever. Medications for him.  I have offered to increase his digoxin to daily. However they do not wish to make any medication changes as his heart rate normally is in the 60s when they checked it at home. I will continue him on his current medication regimen. He will followup with Dr. Wyline MoodBranch in one month.

## 2013-03-31 NOTE — Assessment & Plan Note (Signed)
Blood pressure is slightly elevated on arrival. Recheck of his blood pressure demonstrates a blood pressure of 144/62. He will continue his current medication regimen as directed. He will followup with Dr. Wyline MoodBranch in one month.

## 2013-03-31 NOTE — Progress Notes (Signed)
HPI: [name] is an 80 year old patient of Dr. Wyline MoodBranch we are following for ongoing assessment and management of CAD, multiple cardiovascular risk, atrial fibrillation with RVR with recent hospitalization for same in January of 2015. The patient was seen recently by Dr. Wyline MoodBranch post hospital followup, and was doing well on current medication regimen. His digoxin was ordered for every other day. Unfortunately prescription was sent for a daily dose. He did receive digoxin for 5 days in a row. His daughters who are his caretakers noticed the error, and have switched him back to digoxin every other day as directed. He was not placed on anticoagulation due to fall risk, and history of GI bleed.    Unfortunately, the patient is being followed in his urologist office today, status post procedure at removing stones from his bladder in the setting of hematuria. The patient's heart rate was 140 beats per minute in the office. His urologist call her office and insisted that the patient be seen today. He did not wish to send the patient to the ER for evaluation with known history of A. fib RVR as the patient was asymptomatic, and became tearful when it was suggested that he go to the ER.    By the time the patient came to our office, EKG was completed which revealed normal sinus rhythm with a first-degree AV block, PR interval 0.22, with a heart rate of 95 beats per minute. The patient was feeling well and was without complaint. Allergies  Allergen Reactions  . Cardura [Doxazosin Mesylate]     Current Outpatient Prescriptions  Medication Sig Dispense Refill  . aspirin EC 325 MG EC tablet Take 1 tablet (325 mg total) by mouth daily.  30 tablet  0  . benazepril (LOTENSIN) 5 MG tablet Take 1 tablet (5 mg total) by mouth daily.  30 tablet  6  . digoxin (LANOXIN) 0.125 MG tablet Take 1 tablet (0.125 mg total) by mouth every other day.  15 tablet  6  . esomeprazole (NEXIUM) 40 MG capsule Take 1 capsule (40 mg  total) by mouth 2 (two) times daily before a meal.  60 capsule  2  . feeding supplement, ENSURE COMPLETE, (ENSURE COMPLETE) LIQD Take 237 mLs by mouth 2 (two) times daily between meals.  30 Bottle  1  . guaiFENesin-dextromethorphan (ROBITUSSIN DM) 100-10 MG/5ML syrup Take 5 mLs by mouth every 4 (four) hours as needed for cough.  118 mL  0  . hydrochlorothiazide (MICROZIDE) 12.5 MG capsule Take 1 capsule (12.5 mg total) by mouth daily.  30 capsule  6  . loperamide (IMODIUM) 2 MG capsule Take 1 capsule (2 mg total) by mouth every 4 (four) hours as needed for diarrhea or loose stools.  30 capsule  0  . triamcinolone ointment (KENALOG) 0.1 % Apply 1 application topically 2 (two) times daily.  60 g  3   No current facility-administered medications for this visit.    Past Medical History  Diagnosis Date  . Hypertension   . Hypothyroid   . Reflux   . Kidney stone   . Polymyalgia rheumatica   . Insomnia   . Pressure ulcer   . Elevated PSA   . Atrial fibrillation     Past Surgical History  Procedure Laterality Date  . Appendectomy    . Cholecystectomy    . Thyroidectomy    . Tonsillectomy      ROS: Review of systems complete and found to be negative unless listed above  PHYSICAL EXAM BP 110/69  Pulse 95  Ht 5\' 2"  (1.575 m)  Wt 112 lb (50.803 kg)  BMI 20.48 kg/m2  General: Well developed, well nourished, in no acute distress Head: Eyes PERRLA, No xanthomas.   Normal cephalic and atramatic  Lungs: Clear bilaterally to auscultation and percussion. Heart: HRRR S1 S2, without MRG.  Pulses are 2+ & equal.            No carotid bruit. No JVD.  No abdominal bruits. No femoral bruits. Abdomen: Bowel sounds are positive, abdomen soft and non-tender without masses or                  Hernia's noted. Msk:  Back normal, normal gait. Normal strength and tone for age. Extremities: No clubbing, cyanosis or edema.  DP +1 Neuro: Alert and oriented X 3. Very hard of hearing. Psych:  Good  affect, responds appropriately  EKG: Normal sinus rhythm with first-degree AV block heart rate of 95 beats per minute.  ASSESSMENT AND PLAN

## 2013-03-31 NOTE — Patient Instructions (Signed)
Your physician recommends that you schedule a follow-up appointment in: 1 month with Dr.Branch     Your physician recommends that you continue on your current medications as directed. Please refer to the Current Medication list given to you today.     Thank you for choosing [facility] !         

## 2013-04-21 ENCOUNTER — Other Ambulatory Visit: Payer: Self-pay | Admitting: Urology

## 2013-04-21 DIAGNOSIS — R972 Elevated prostate specific antigen [PSA]: Secondary | ICD-10-CM

## 2013-04-24 ENCOUNTER — Encounter (HOSPITAL_COMMUNITY)
Admission: RE | Admit: 2013-04-24 | Discharge: 2013-04-24 | Disposition: A | Discharge status: Home / Self Care | Payer: Medicare HMO | Source: Ambulatory Visit | Attending: Urology | Admitting: Urology

## 2013-04-24 ENCOUNTER — Encounter (HOSPITAL_COMMUNITY): Payer: Self-pay

## 2013-04-24 DIAGNOSIS — R972 Elevated prostate specific antigen [PSA]: Secondary | ICD-10-CM | POA: Insufficient documentation

## 2013-04-24 MED ORDER — TECHNETIUM TC 99M MEDRONATE IV KIT
25.0000 | PACK | Freq: Once | INTRAVENOUS | Status: AC | PRN
  Administered 2013-04-24: 25 via INTRAVENOUS

## 2013-05-08 ENCOUNTER — Encounter: Payer: Medicare HMO | Admitting: Cardiology

## 2013-05-08 ENCOUNTER — Encounter: Payer: Self-pay | Admitting: *Deleted

## 2013-05-08 NOTE — Progress Notes (Signed)
Clinical Summary [name] is a 80 y.o.male  1. Afib  - recent admit Jan 2015 with afib with RVR, converted with IV diltiazem and was transitioned to oral metoprolol and digoxin.  - CHADS2 score of 2, after discussion while inhospital patient elected for ASA and not antigcoag. Concerned about his bleeding risk and history of GI bleeds.   - noted high rates during previous urology appointment, at cardio f/u after that rates were 90s -no palpitations, no chest pain, no SOB   2. HTN  - does not check at home   3. Mild to moderate aortic stenosis  - poorly visuazlied on most recent echo, mean gradient of 21 mmHg suggestive of moderate but technically limited.   4. Diastolic dysfunction  - grade 2 on most recent echo  - denies any orthopnea, PND, no LE edema   Past Medical History  Diagnosis Date  . Hypertension   . Hypothyroid   . Reflux   . Kidney stone   . Polymyalgia rheumatica   . Insomnia   . Pressure ulcer   . Elevated PSA   . Atrial fibrillation      Allergies  Allergen Reactions  . Cardura [Doxazosin Mesylate]      Current Outpatient Prescriptions  Medication Sig Dispense Refill  . aspirin EC 325 MG EC tablet Take 1 tablet (325 mg total) by mouth daily.  30 tablet  0  . benazepril (LOTENSIN) 5 MG tablet Take 1 tablet (5 mg total) by mouth daily.  30 tablet  6  . digoxin (LANOXIN) 0.125 MG tablet Take 1 tablet (0.125 mg total) by mouth every other day.  15 tablet  6  . esomeprazole (NEXIUM) 40 MG capsule Take 1 capsule (40 mg total) by mouth 2 (two) times daily before a meal.  60 capsule  2  . feeding supplement, ENSURE COMPLETE, (ENSURE COMPLETE) LIQD Take 237 mLs by mouth 2 (two) times daily between meals.  30 Bottle  1  . guaiFENesin-dextromethorphan (ROBITUSSIN DM) 100-10 MG/5ML syrup Take 5 mLs by mouth every 4 (four) hours as needed for cough.  118 mL  0  . hydrochlorothiazide (MICROZIDE) 12.5 MG capsule Take 1 capsule (12.5 mg total) by mouth  daily.  30 capsule  6  . loperamide (IMODIUM) 2 MG capsule Take 1 capsule (2 mg total) by mouth every 4 (four) hours as needed for diarrhea or loose stools.  30 capsule  0  . triamcinolone ointment (KENALOG) 0.1 % Apply 1 application topically 2 (two) times daily.  60 g  3   No current facility-administered medications for this visit.     Past Surgical History  Procedure Laterality Date  . Appendectomy    . Cholecystectomy    . Thyroidectomy    . Tonsillectomy       Allergies  Allergen Reactions  . Cardura [Doxazosin Mesylate]       Family History  Problem Relation Age of Onset  . Heart attack Father      Social History [name] that he has never smoked. He does not have any smokeless tobacco history on file. [name] that he does not drink alcohol.   Review of Systems CONSTITUTIONAL: No weight loss, fever, chills, weakness or fatigue.  HEENT: Eyes: No visual loss, blurred vision, double vision or yellow sclerae.No hearing loss, sneezing, congestion, runny nose or sore throat.  SKIN: No rash or itching.  CARDIOVASCULAR:  RESPIRATORY: No shortness of breath, cough or sputum.  GASTROINTESTINAL: No anorexia, nausea, vomiting or diarrhea. No abdominal pain or blood.  GENITOURINARY: No burning on urination, no polyuria NEUROLOGICAL: No headache, dizziness, syncope, paralysis, ataxia, numbness or tingling in the extremities. No change in bowel or bladder control.  MUSCULOSKELETAL: No muscle, back pain, joint pain or stiffness.  LYMPHATICS: No enlarged nodes. No history of splenectomy.  PSYCHIATRIC: No history of depression or anxiety.  ENDOCRINOLOGIC: No reports of sweating, cold or heat intolerance. No polyuria or polydipsia.  Marland Kitchen   Physical Examination There were no vitals filed for this visit. There were no vitals filed for this visit.  Gen: resting comfortably, no acute distress HEENT: no scleral icterus, pupils equal round and reactive, no  palptable cervical adenopathy,  CV Resp: Clear to auscultation bilaterally GI: abdomen is soft, non-tender, non-distended, normal bowel sounds, no hepatosplenomegaly MSK: extremities are warm, no edema.  Skin: warm, no rash Neuro:  no focal deficits Psych: appropriate affect   Diagnostic Studies 01/2013 Echo  LVEF 55-60%, akinesis basalinferolateral myocardium, grade II diastolic dysfunction,      Assessment and Plan  1. Afib  - no current symptoms  - He has been managed on every other day digoxin, initially in the hospital he had low blood pressures which did not tolerate beta blockers or CCBs. It seems he was restarted on his HCTZ/benazepril combo at discharge however  - continue ASA, family concerned about blood thinners and risk of bleeding and falls   2. HTN  - low diastolic bp today, patient denies significant symptoms  - given his age, reasonable bp goal is <150/90, at most <130/80 given some underlying renal dysfunction  - will stop his benazepril/HCTZ combo, start lower dose benazepril at 5 mg as he does have some renal dysfunction and continue HCTZ 12.5mg  as he does have grade II diastolic dysfunction   3. Aortic stenosis  - mild to moderate by recent echo, though technically limited  - will repeat echo in a few months as there is technical concern that we may be underestimating severity based on available images.  - family is not in favor of potential intervention if disease progressed to that point   4. Diastolic dysfunction  - grade II on most recent echo  - appears euvolemic on exam, no signficant symptoms  - continue daily HCTZ.          Antoine Poche, M.D., F.A.C.C.

## 2013-05-11 ENCOUNTER — Encounter: Payer: Self-pay | Admitting: Family Medicine

## 2013-05-11 ENCOUNTER — Ambulatory Visit (INDEPENDENT_AMBULATORY_CARE_PROVIDER_SITE_OTHER): Payer: Medicare HMO | Admitting: Family Medicine

## 2013-05-11 VITALS — BP 118/70 | Temp 98.6°F | Ht 61.25 in | Wt 109.0 lb

## 2013-05-11 DIAGNOSIS — J309 Allergic rhinitis, unspecified: Secondary | ICD-10-CM

## 2013-05-11 DIAGNOSIS — K219 Gastro-esophageal reflux disease without esophagitis: Secondary | ICD-10-CM

## 2013-05-11 DIAGNOSIS — I10 Essential (primary) hypertension: Secondary | ICD-10-CM

## 2013-05-11 MED ORDER — BENAZEPRIL HCL 5 MG PO TABS: 5.0000 mg | ORAL_TABLET | Freq: Every day | ORAL | Status: DC

## 2013-05-11 MED ORDER — LORATADINE 10 MG PO TABS: 10.0000 mg | ORAL_TABLET | Freq: Every day | ORAL | Status: DC

## 2013-05-11 MED ORDER — HYDROCHLOROTHIAZIDE 12.5 MG PO CAPS: 12.5000 mg | ORAL_CAPSULE | Freq: Every day | ORAL | Status: DC

## 2013-05-11 MED ORDER — DIGOXIN 125 MCG PO TABS: 0.1250 mg | ORAL_TABLET | ORAL | Status: DC

## 2013-05-11 MED ORDER — ESOMEPRAZOLE MAGNESIUM 40 MG PO CPDR: 40.0000 mg | DELAYED_RELEASE_CAPSULE | Freq: Every day | ORAL | Status: DC

## 2013-05-11 NOTE — Progress Notes (Signed)
   Subjective:    Patient ID: Jeffrey Mccarty, male    DOB: 1921-06-02, 80 y.o.   MRN: [id number]  HPIHypertension check up. Compliant with the hydrochlorothiazide currently. Watching his salt intake. Walking a bit but not much.  Needs refill on nexium. For for and states that he exam is definitely helping his heartburn. No obvious side effects from it. So far insurance is covering it.  Cough. Started 5 days ago. Generally nonproductive. Some drainage. Feels that allergies may be acting up somewhat. No fever no chills no wheezes    Review of Systems Wound no chest pain no headache no back pain no change in bowel habits no blood in stool ROS   otherwise physical exam negative alert no apparent distress. HET Mondays congestion neck supple. Lungs clear. Heart regular rate and rhythm. Blood pressure repeat 130/80 . Abdomen benign.    Object Diet andive:   Physical Exam See above       Assessment & Plan:   impression #1 hypertension good control. #2 reflux clinically stable. #3 cough likely allergy discussed plan add Claritin daily. Maintain other medications. Diet exercise discussed. Recheck in 6 months. WSL

## 2013-05-24 DIAGNOSIS — J309 Allergic rhinitis, unspecified: Secondary | ICD-10-CM | POA: Insufficient documentation

## 2013-06-04 ENCOUNTER — Other Ambulatory Visit (HOSPITAL_COMMUNITY): Payer: Self-pay | Admitting: "Endocrinology

## 2013-06-04 DIAGNOSIS — E0590 Thyrotoxicosis, unspecified without thyrotoxic crisis or storm: Secondary | ICD-10-CM

## 2013-06-19 ENCOUNTER — Encounter (HOSPITAL_COMMUNITY): Payer: Self-pay

## 2013-06-19 ENCOUNTER — Encounter (HOSPITAL_COMMUNITY)
Admission: RE | Admit: 2013-06-19 | Discharge: 2013-06-19 | Disposition: A | Discharge status: Home / Self Care | Payer: Medicare HMO | Source: Ambulatory Visit | Attending: "Endocrinology | Admitting: "Endocrinology

## 2013-06-19 DIAGNOSIS — E0590 Thyrotoxicosis, unspecified without thyrotoxic crisis or storm: Secondary | ICD-10-CM

## 2013-06-19 MED ORDER — SODIUM IODIDE I 131 CAPSULE
25.0000 | Freq: Once | INTRAVENOUS | Status: AC | PRN
  Administered 2013-06-19: 26 via ORAL

## 2013-06-26 NOTE — Progress Notes (Signed)
This encounter was created in error - please disregard.

## 2013-06-30 ENCOUNTER — Ambulatory Visit: Payer: Medicare HMO | Admitting: Urology

## 2013-10-28 ENCOUNTER — Encounter: Payer: Self-pay | Admitting: Cardiology

## 2013-10-28 ENCOUNTER — Ambulatory Visit (INDEPENDENT_AMBULATORY_CARE_PROVIDER_SITE_OTHER): Payer: Commercial Managed Care - HMO | Admitting: Cardiology

## 2013-10-28 VITALS — BP 138/68 | HR 78 | Ht 62.0 in | Wt 111.0 lb

## 2013-10-28 DIAGNOSIS — I359 Nonrheumatic aortic valve disorder, unspecified: Secondary | ICD-10-CM

## 2013-10-28 DIAGNOSIS — I10 Essential (primary) hypertension: Secondary | ICD-10-CM

## 2013-10-28 DIAGNOSIS — I519 Heart disease, unspecified: Secondary | ICD-10-CM

## 2013-10-28 DIAGNOSIS — I350 Nonrheumatic aortic (valve) stenosis: Secondary | ICD-10-CM

## 2013-10-28 DIAGNOSIS — I5189 Other ill-defined heart diseases: Secondary | ICD-10-CM

## 2013-10-28 DIAGNOSIS — I4891 Unspecified atrial fibrillation: Secondary | ICD-10-CM

## 2013-10-28 NOTE — Progress Notes (Signed)
Clinical Summary [name] is a 80 y.o.male seen today for follow up of the following medical problems.   1. Afib  - blood pressure has had difficulty tolerating beta blockers or CCBs during prior admission, he has actually been only on dig 0.125 mg every other day and doing well - CHADS2 score of 2, after discussion while inhospital patient elected for ASA and not antigcoag. Concerned about his bleeding risk and history of GI bleeds.  -no palpitations, no chest pain, no SOB since last visit  2. HTN  - does not check at home  - compliant with meds  3. Mild to moderate aortic stenosis  - poorly visuazlied on most recent echo, mean gradient of 21 mmHg suggestive of moderate but technically limited.  - denies any current symptoms  4. Diastolic dysfunction  - grade 2 on most recent echo  - denies any orthopnea, PND, no LE edema  Past Medical History  Diagnosis Date  . Hypertension   . Hypothyroid   . Reflux   . Kidney stone   . Polymyalgia rheumatica   . Insomnia   . Pressure ulcer   . Elevated PSA   . Atrial fibrillation      Allergies  Allergen Reactions  . Cardura [Doxazosin Mesylate]      Current Outpatient Prescriptions  Medication Sig Dispense Refill  . benazepril (LOTENSIN) 5 MG tablet Take 1 tablet (5 mg total) by mouth daily.  30 tablet  5  . digoxin (LANOXIN) 0.125 MG tablet Take 1 tablet (0.125 mg total) by mouth every other day.  15 tablet  5  . esomeprazole (NEXIUM) 40 MG capsule Take 1 capsule (40 mg total) by mouth daily at 12 noon.  30 capsule  5  . feeding supplement, ENSURE COMPLETE, (ENSURE COMPLETE) LIQD Take 237 mLs by mouth 2 (two) times daily between meals.  30 Bottle  1  . hydrochlorothiazide (MICROZIDE) 12.5 MG capsule Take 1 capsule (12.5 mg total) by mouth daily.  30 capsule  5  . loratadine (CLARITIN) 10 MG tablet Take 1 tablet (10 mg total) by mouth daily.  30 tablet  5  . triamcinolone ointment (KENALOG) 0.1 % Apply 1 application  topically 2 (two) times daily.  60 g  3   No current facility-administered medications for this visit.     Past Surgical History  Procedure Laterality Date  . Appendectomy    . Cholecystectomy    . Thyroidectomy    . Tonsillectomy       Allergies  Allergen Reactions  . Cardura [Doxazosin Mesylate]       Family History  Problem Relation Age of Onset  . Heart attack Father      Social History [name] that he has never smoked. He does not have any smokeless tobacco history on file. [name] that he does not drink alcohol.   Review of Systems CONSTITUTIONAL: No weight loss, fever, chills, weakness or fatigue.  HEENT: Eyes: No visual loss, blurred vision, double vision or yellow sclerae.No hearing loss, sneezing, congestion, runny nose or sore throat.  SKIN: No rash or itching.  CARDIOVASCULAR: per HPI RESPIRATORY: No shortness of breath, cough or sputum.  GASTROINTESTINAL: No anorexia, nausea, vomiting or diarrhea. No abdominal pain or blood.  GENITOURINARY: No burning on urination, no polyuria NEUROLOGICAL: No headache, dizziness, syncope, paralysis, ataxia, numbness or tingling in the extremities. No change in bowel or bladder control.  MUSCULOSKELETAL: No muscle, back pain, joint pain or  stiffness.  LYMPHATICS: No enlarged nodes. No history of splenectomy.  PSYCHIATRIC: No history of depression or anxiety.  ENDOCRINOLOGIC: No reports of sweating, cold or heat intolerance. No polyuria or polydipsia.  Marland Kitchen   Physical Examination p 78 bp 138/68 Wt 111 lbs BMI 20 Gen: resting comfortably, no acute distress HEENT: no scleral icterus, pupils equal round and reactive, no palptable cervical adenopathy,  CV: irreg, reg rate, 3/6 systolic murmur RUSB, no JVD Resp: Clear to auscultation bilaterally GI: abdomen is soft, non-tender, non-distended, normal bowel sounds, no hepatosplenomegaly MSK: extremities are warm, no edema.  Skin: warm, no rash Neuro:   no focal deficits Psych: appropriate affect   Diagnostic Studies 01/2013 Echo  LVEF 55-60%, akinesis basalinferolateral myocardium, grade II diastolic dysfunction,      Assessment and Plan  1. Afib  - no current symptoms  - He has been managed on every other day digoxin, initially in the hospital he had low blood pressures which did not tolerate beta blockers or CCBs. It seems he was restarted on his HCTZ/benazepril combo at discharge however and has tolerated  - continue ASA, family concerned about blood thinners and risk of bleeding and falls   2. HTN  - at goal, continue current meds  3. Aortic stenosis  - mild to moderate by recent echo, though technically limited  - denies any symptoms. Will consider repeat study next visit - family is not in favor of potential intervention if disease progressed to that point   4. Diastolic dysfunction  - grade II on most recent echo  - appears euvolemic on exam, no signficant symptoms  - continue daily HCTZ.   F/u 4 months    Antoine Poche, M.D.

## 2013-10-28 NOTE — Patient Instructions (Signed)
Your physician wants you to follow-up in: 4 months You will receive a reminder letter in the mail two months in advance. If you don't receive a letter, please call our office to schedule the follow-up appointment.  Your physician has recommended you make the following change in your medication:   START ASPIRIN 81 MG  Thank you for choosing [facility]!!

## 2013-11-10 ENCOUNTER — Ambulatory Visit (INDEPENDENT_AMBULATORY_CARE_PROVIDER_SITE_OTHER): Payer: Medicare HMO | Admitting: Family Medicine

## 2013-11-10 ENCOUNTER — Encounter: Payer: Self-pay | Admitting: Family Medicine

## 2013-11-10 VITALS — BP 122/80 | Ht 61.0 in | Wt 110.2 lb

## 2013-11-10 DIAGNOSIS — I10 Essential (primary) hypertension: Secondary | ICD-10-CM

## 2013-11-10 DIAGNOSIS — Z23 Encounter for immunization: Secondary | ICD-10-CM

## 2013-11-10 DIAGNOSIS — E0590 Thyrotoxicosis, unspecified without thyrotoxic crisis or storm: Secondary | ICD-10-CM

## 2013-11-10 DIAGNOSIS — I4891 Unspecified atrial fibrillation: Secondary | ICD-10-CM

## 2013-11-10 NOTE — Progress Notes (Signed)
   Subjective:    Patient ID: Jeffrey Mccarty, male    DOB: 1921/07/03, 80 y.o.   MRN: [id number]  Hypertension This is a chronic problem. The current episode started more than 1 year ago. Risk factors for coronary artery disease include male gender. Treatments tried: lotensin, HTCZ, Benazepril. There are no compliance problems.    Compliant with blood pressure medication. No obvious side effects.  Patient states compliant with his thyroid medication. Followed by Dr. Coralee NorthNina for this.  Ongoing allergies. Uses Claritin with a fair amount of success.  Reflux stable as long as patient stays with medication.   Review of Systems No headache no chest pain no back pain no abdominal pain no change in bowel habits    Objective:   Physical Exam Alert no acute distress. Vitals stable. Lungs clear. Heart regular rate and rhythm. HEENT normal. Abdomen benign. Ankles without edema.       Assessment & Plan:  Pressure 1 hypertension good control discussed #2 reflux clinically stable discussed #3 allergic rhinitis stable also. Plan maintain same medications. Diet exercise discussed. Followup with endocrinologist. Followup in 6 months. Pneumonia vaccine WSL

## 2013-12-01 ENCOUNTER — Ambulatory Visit (INDEPENDENT_AMBULATORY_CARE_PROVIDER_SITE_OTHER): Payer: Commercial Managed Care - HMO | Admitting: Urology

## 2013-12-01 DIAGNOSIS — N403 Nodular prostate with lower urinary tract symptoms: Secondary | ICD-10-CM

## 2013-12-01 DIAGNOSIS — R972 Elevated prostate specific antigen [PSA]: Secondary | ICD-10-CM

## 2014-02-01 DIAGNOSIS — E0590 Thyrotoxicosis, unspecified without thyrotoxic crisis or storm: Secondary | ICD-10-CM | POA: Diagnosis not present

## 2014-02-08 DIAGNOSIS — E032 Hypothyroidism due to medicaments and other exogenous substances: Secondary | ICD-10-CM | POA: Diagnosis not present

## 2014-02-12 ENCOUNTER — Other Ambulatory Visit: Payer: Self-pay | Admitting: Family Medicine

## 2014-03-20 ENCOUNTER — Other Ambulatory Visit: Payer: Self-pay | Admitting: Family Medicine

## 2014-04-21 ENCOUNTER — Other Ambulatory Visit: Payer: Self-pay | Admitting: Family Medicine

## 2014-04-27 ENCOUNTER — Ambulatory Visit (INDEPENDENT_AMBULATORY_CARE_PROVIDER_SITE_OTHER): Payer: Commercial Managed Care - HMO | Admitting: Cardiology

## 2014-04-27 ENCOUNTER — Encounter: Payer: Self-pay | Admitting: Cardiology

## 2014-04-27 VITALS — BP 110/60 | HR 73 | Ht 62.0 in | Wt 112.6 lb

## 2014-04-27 DIAGNOSIS — I10 Essential (primary) hypertension: Secondary | ICD-10-CM | POA: Diagnosis not present

## 2014-04-27 DIAGNOSIS — I482 Chronic atrial fibrillation, unspecified: Secondary | ICD-10-CM

## 2014-04-27 DIAGNOSIS — I350 Nonrheumatic aortic (valve) stenosis: Secondary | ICD-10-CM

## 2014-04-27 DIAGNOSIS — I5032 Chronic diastolic (congestive) heart failure: Secondary | ICD-10-CM

## 2014-04-27 NOTE — Patient Instructions (Signed)

## 2014-04-27 NOTE — Progress Notes (Signed)
Clinical Summary Mr. Pierotti is a 81 y.o.male seen today for follow up of the following medical problems.   1. Afib  - blood pressure has had difficulty tolerating beta blockers or CCBs during prior admission, he has actually been only on dig 0.125 mg every other day and doing well - CHADS2 score of 2, after discussion while inhospital patient elected for ASA and not antigcoag. Concerned about his bleeding risk and history of GI bleeds. He continues to not want anticoag.   - denies any palpitations. No lightheadedness or dizziness - compliant with meds  2. HTN  - does not check at home  - compliant with meds  3. Mild to moderate aortic stenosis  - poorly visuazlied on most recent echo, mean gradient of 21 mmHg suggestive of moderate but technically limited.  - denies any current symptoms  4. Diastolic dysfunction  - grade 2 on most recent echo  - denies any orthopnea, PND, no LE edema    Past Medical History  Diagnosis Date  . Hypertension   . Hypothyroid   . Reflux   . Kidney stone   . Polymyalgia rheumatica   . Insomnia   . Pressure ulcer   . Elevated PSA   . Atrial fibrillation      Allergies  Allergen Reactions  . Cardura [Doxazosin Mesylate]      Current Outpatient Prescriptions  Medication Sig Dispense Refill  . aspirin 81 MG tablet Take 81 mg by mouth daily.    . benazepril (LOTENSIN) 5 MG tablet take 1 tablet by mouth once daily 30 tablet 5  . DIGITEK 125 MCG tablet take 1 tablet by mouth every other day 15 tablet 2  . feeding supplement, ENSURE COMPLETE, (ENSURE COMPLETE) LIQD Take 237 mLs by mouth 2 (two) times daily between meals. 30 Bottle 1  . hydrochlorothiazide (MICROZIDE) 12.5 MG capsule take 1 capsule by mouth once daily 30 capsule 5  . levothyroxine (SYNTHROID, LEVOTHROID) 50 MCG tablet Take 50 mcg by mouth daily before breakfast.     . loratadine (CLARITIN) 10 MG tablet Take 1 tablet (10 mg total) by mouth daily. 30 tablet 5  .  NEXIUM 40 MG capsule take 1 capsule by mouth once daily 90 capsule 2  . triamcinolone ointment (KENALOG) 0.1 % Apply 1 application topically 2 (two) times daily. 60 g 3   No current facility-administered medications for this visit.     Past Surgical History  Procedure Laterality Date  . Appendectomy    . Cholecystectomy    . Thyroidectomy    . Tonsillectomy       Allergies  Allergen Reactions  . Cardura [Doxazosin Mesylate]       Family History  Problem Relation Age of Onset  . Heart attack Father      Social History Mr. Waterson reports that he has never smoked. He has never used smokeless tobacco. Mr. Arata reports that he does not drink alcohol.   Review of Systems CONSTITUTIONAL: No weight loss, fever, chills, weakness or fatigue.  HEENT: Eyes: No visual loss, blurred vision, double vision or yellow sclerae.No hearing loss, sneezing, congestion, runny nose or sore throat.  SKIN: No rash or itching.  CARDIOVASCULAR: per HPI RESPIRATORY: No shortness of breath, cough or sputum.  GASTROINTESTINAL: No anorexia, nausea, vomiting or diarrhea. No abdominal pain or blood.  GENITOURINARY: No burning on urination, no polyuria NEUROLOGICAL: No headache, dizziness, syncope, paralysis, ataxia, numbness or tingling in the extremities. No change in bowel  or bladder control.  MUSCULOSKELETAL: No muscle, back pain, joint pain or stiffness.  LYMPHATICS: No enlarged nodes. No history of splenectomy.  PSYCHIATRIC: No history of depression or anxiety.  ENDOCRINOLOGIC: No reports of sweating, cold or heat intolerance. No polyuria or polydipsia.  Marland Kitchen.   Physical Examination p 73 bp 110/60 Wt 112 lbs BMI 21 Gen: resting comfortably, no acute distress HEENT: no scleral icterus, pupils equal round and reactive, no palptable cervical adenopathy,  CV: RRR, no m/r/g, no JVD, no carotid bruits Resp: Clear to auscultation bilaterally GI: abdomen is soft, non-tender, non-distended, normal  bowel sounds, no hepatosplenomegaly MSK: extremities are warm, no edema.  Skin: warm, no rash Neuro:  no focal deficits Psych: appropriate affect   Diagnostic Studies 01/2013 Echo  LVEF 55-60%, akinesis basalinferolateral myocardium, grade II diastolic dysfunction,     Assessment and Plan  1. Afib  - no current symptoms  - He has been managed on every other day digoxin, initially in the hospital he had low blood pressures which did not tolerate beta blockers or CCBs. Continue current meds - continue ASA, he has not wanted anticoag  2. HTN  - at goal, continue current meds  3. Aortic stenosis  - mild to moderate by recent echo, though technically limited  - he states he would not want any form of intervention at any time. Will not repeat echo.   4. Diastolic dysfunction  - grade II on most recent echo  - appears euvolemic on exam, no signficant symptoms  - continue daily HCTZ.    F/u 6 months  Antoine PocheJonathan F. Yvon Mccord, M.D.

## 2014-05-06 ENCOUNTER — Ambulatory Visit: Payer: Medicare HMO | Admitting: Family Medicine

## 2014-05-10 ENCOUNTER — Ambulatory Visit (INDEPENDENT_AMBULATORY_CARE_PROVIDER_SITE_OTHER): Payer: Commercial Managed Care - HMO | Admitting: Family Medicine

## 2014-05-10 ENCOUNTER — Encounter: Payer: Self-pay | Admitting: Family Medicine

## 2014-05-10 VITALS — BP 124/80 | Ht 61.0 in | Wt 114.1 lb

## 2014-05-10 DIAGNOSIS — E785 Hyperlipidemia, unspecified: Secondary | ICD-10-CM

## 2014-05-10 DIAGNOSIS — K219 Gastro-esophageal reflux disease without esophagitis: Secondary | ICD-10-CM | POA: Diagnosis not present

## 2014-05-10 DIAGNOSIS — D649 Anemia, unspecified: Secondary | ICD-10-CM

## 2014-05-10 DIAGNOSIS — R5383 Other fatigue: Secondary | ICD-10-CM | POA: Diagnosis not present

## 2014-05-10 DIAGNOSIS — I10 Essential (primary) hypertension: Secondary | ICD-10-CM | POA: Diagnosis not present

## 2014-05-10 DIAGNOSIS — E039 Hypothyroidism, unspecified: Secondary | ICD-10-CM

## 2014-05-10 DIAGNOSIS — Z79899 Other long term (current) drug therapy: Secondary | ICD-10-CM

## 2014-05-10 NOTE — Progress Notes (Signed)
   Subjective:    Patient ID: Jeffrey EdelsonRoy C Tipps, male    DOB: [birth year], 81 y.o.   MRN: [id number]  Hypertension This is a chronic problem. The current episode started more than 1 year ago. The problem has been gradually improving since onset. The problem is controlled. There are no associated agents to hypertension. There are no known risk factors for coronary artery disease. Treatments tried: benazapril. The current treatment provides significant improvement. There are no compliance problems.    Patient has a mole on his chest that he would like the doctor to take a look at.    History of chronic anemia. Some fatigue at times. Blood work has not been checked for one year.  Reflux and heartburn is stable as long as patient stays on medications.  Review of Systems No headache no chest pain no back pain abdominal pain no change in bowel habits no blood in stool ROS otherwise negative    Objective:   Physical Exam  Alert no apparent distress. H&T normal neck supple. Lungs clear heart regular in rhythm. Impressive seborrheic keratosis left anterior chest within normal limits      Assessment & Plan:  Impression 1 hypertension good control #2 anemia status uncertain #3 seborrheic keratosis reassured #4 reflux stable plan maintain all medications. Diet discussed. Exercise discussed WSL

## 2014-05-20 DIAGNOSIS — D649 Anemia, unspecified: Secondary | ICD-10-CM | POA: Diagnosis not present

## 2014-05-20 DIAGNOSIS — E785 Hyperlipidemia, unspecified: Secondary | ICD-10-CM | POA: Diagnosis not present

## 2014-05-20 DIAGNOSIS — Z79899 Other long term (current) drug therapy: Secondary | ICD-10-CM | POA: Diagnosis not present

## 2014-05-20 DIAGNOSIS — E039 Hypothyroidism, unspecified: Secondary | ICD-10-CM | POA: Diagnosis not present

## 2014-05-21 LAB — CBC WITH DIFFERENTIAL/PLATELET
BASOS ABS: 0.1 10*3/uL (ref 0.0–0.2)
Basos: 2 %
EOS ABS: 0.3 10*3/uL (ref 0.0–0.4)
EOS: 5 %
HCT: 37 % — ABNORMAL LOW (ref 37.5–51.0)
Hemoglobin: 12.2 g/dL — ABNORMAL LOW (ref 12.6–17.7)
IMMATURE GRANS (ABS): 0 10*3/uL (ref 0.0–0.1)
Immature Granulocytes: 0 %
LYMPHS: 29 %
Lymphocytes Absolute: 1.6 10*3/uL (ref 0.7–3.1)
MCH: 29.7 pg (ref 26.6–33.0)
MCHC: 33 g/dL (ref 31.5–35.7)
MCV: 90 fL (ref 79–97)
MONOCYTES: 11 %
Monocytes Absolute: 0.6 10*3/uL (ref 0.1–0.9)
NEUTROS PCT: 53 %
Neutrophils Absolute: 2.9 10*3/uL (ref 1.4–7.0)
PLATELETS: 281 10*3/uL (ref 150–379)
RBC: 4.11 x10E6/uL — AB (ref 4.14–5.80)
RDW: 15.6 % — ABNORMAL HIGH (ref 12.3–15.4)
WBC: 5.6 10*3/uL (ref 3.4–10.8)

## 2014-05-21 LAB — BASIC METABOLIC PANEL
BUN/Creatinine Ratio: 18 (ref 10–22)
BUN: 27 mg/dL (ref 10–36)
CHLORIDE: 99 mmol/L (ref 97–108)
CO2: 23 mmol/L (ref 18–29)
Calcium: 9.7 mg/dL (ref 8.6–10.2)
Creatinine, Ser: 1.46 mg/dL — ABNORMAL HIGH (ref 0.76–1.27)
GFR calc Af Amer: 48 mL/min/{1.73_m2} — ABNORMAL LOW (ref >59)
GFR calc non Af Amer: 42 mL/min/{1.73_m2} — ABNORMAL LOW (ref >59)
GLUCOSE: 92 mg/dL (ref 65–99)
Potassium: 5 mmol/L (ref 3.5–5.2)
Sodium: 138 mmol/L (ref 134–144)

## 2014-05-21 LAB — LIPID PANEL
Chol/HDL Ratio: 3.1 ratio units (ref 0.0–5.0)
Cholesterol, Total: 209 mg/dL — ABNORMAL HIGH (ref 100–199)
HDL: 67 mg/dL (ref >39)
LDL CALC: 125 mg/dL — AB (ref 0–99)
TRIGLYCERIDES: 83 mg/dL (ref 0–149)
VLDL Cholesterol Cal: 17 mg/dL (ref 5–40)

## 2014-05-21 LAB — HEPATIC FUNCTION PANEL
ALT: 9 IU/L (ref 0–44)
AST: 20 IU/L (ref 0–40)
Albumin: 4.4 g/dL (ref 3.2–4.6)
Alkaline Phosphatase: 38 IU/L — ABNORMAL LOW (ref 39–117)
BILIRUBIN TOTAL: 0.6 mg/dL (ref 0.0–1.2)
Bilirubin, Direct: 0.15 mg/dL (ref 0.00–0.40)
Total Protein: 7.1 g/dL (ref 6.0–8.5)

## 2014-05-21 LAB — TSH: TSH: 3.15 u[IU]/mL (ref 0.450–4.500)

## 2014-05-21 LAB — DIGOXIN LEVEL: DIGOXIN LVL: 0.7 ng/mL — AB (ref 0.9–2.0)

## 2014-05-23 ENCOUNTER — Encounter: Payer: Self-pay | Admitting: Family Medicine

## 2014-05-25 ENCOUNTER — Other Ambulatory Visit: Payer: Self-pay | Admitting: Family Medicine

## 2014-06-12 ENCOUNTER — Other Ambulatory Visit: Payer: Self-pay | Admitting: Family Medicine

## 2014-07-31 ENCOUNTER — Other Ambulatory Visit: Payer: Self-pay | Admitting: Family Medicine

## 2014-09-04 ENCOUNTER — Other Ambulatory Visit: Payer: Self-pay | Admitting: Family Medicine

## 2014-10-08 ENCOUNTER — Ambulatory Visit: Payer: Commercial Managed Care - HMO | Admitting: Cardiology

## 2014-11-02 ENCOUNTER — Ambulatory Visit (INDEPENDENT_AMBULATORY_CARE_PROVIDER_SITE_OTHER): Payer: Commercial Managed Care - HMO | Admitting: Cardiology

## 2014-11-02 ENCOUNTER — Encounter: Payer: Self-pay | Admitting: Cardiology

## 2014-11-02 VITALS — BP 116/68 | HR 96 | Ht 62.0 in | Wt 112.2 lb

## 2014-11-02 DIAGNOSIS — I482 Chronic atrial fibrillation, unspecified: Secondary | ICD-10-CM

## 2014-11-02 DIAGNOSIS — I5032 Chronic diastolic (congestive) heart failure: Secondary | ICD-10-CM

## 2014-11-02 DIAGNOSIS — I350 Nonrheumatic aortic (valve) stenosis: Secondary | ICD-10-CM | POA: Diagnosis not present

## 2014-11-02 DIAGNOSIS — I10 Essential (primary) hypertension: Secondary | ICD-10-CM

## 2014-11-02 NOTE — Progress Notes (Signed)
Patient ID: Jeffrey Mccarty, male   DOB: 11-28-1921, 81 y.o.   MRN: [id number]     Clinical Summary Jeffrey Mccarty is a 81 y.o.male seen today for follow up of the following medical problems.   1. Afib  - blood pressure has had difficulty tolerating beta blockers or CCBs during prior admission, he has actually been only on dig 0.125 mg every other day and doing well - CHADS2 score of 2, he has refused anticoag and chosen to stay on ASA - denies any palpitations. No lightheadedness or dizziness - compliant with meds  2. HTN  - does not check at home  - compliant with meds   3. Mild to moderate aortic stenosis  - poorly visuazlied on most recent echo, mean gradient of 21 mmHg suggestive of moderate but technically limited.  - denies any current symptoms   4. Diastolic dysfunction  - grade 2 on most recent echo  - denies any orthopnea, PND, no LE edema Past Medical History  Diagnosis Date  . Hypertension   . Hypothyroid   . Reflux   . Kidney stone   . Polymyalgia rheumatica   . Insomnia   . Pressure ulcer   . Elevated PSA   . Atrial fibrillation      Allergies  Allergen Reactions  . Cardura [Doxazosin Mesylate]      Current Outpatient Prescriptions  Medication Sig Dispense Refill  . aspirin 325 MG EC tablet Take 325 mg by mouth daily.    . benazepril (LOTENSIN) 5 MG tablet take 1 tablet by mouth once daily 30 tablet 5  . digoxin (LANOXIN) 0.125 MG tablet take 1 tablet by mouth every other day 15 tablet 2  . feeding supplement, ENSURE COMPLETE, (ENSURE COMPLETE) LIQD Take 237 mLs by mouth 2 (two) times daily between meals. 30 Bottle 1  . hydrochlorothiazide (MICROZIDE) 12.5 MG capsule take 1 capsule by mouth once daily 30 capsule 5  . levothyroxine (SYNTHROID, LEVOTHROID) 75 MCG tablet Take 75 mcg by mouth every morning.  0  . NEXIUM 40 MG capsule take 1 capsule by mouth once daily 90 capsule 2  . triamcinolone ointment (KENALOG) 0.1 % apply to affected area twice  a day 60 g 3   No current facility-administered medications for this visit.     Past Surgical History  Procedure Laterality Date  . Appendectomy    . Cholecystectomy    . Thyroidectomy    . Tonsillectomy       Allergies  Allergen Reactions  . Cardura [Doxazosin Mesylate]       Family History  Problem Relation Age of Onset  . Heart attack Father      Social History Jeffrey Mccarty reports that he has never smoked. He has never used smokeless tobacco. Jeffrey Mccarty reports that he does not drink alcohol.   Review of Systems CONSTITUTIONAL: No weight loss, fever, chills, weakness or fatigue.  HEENT: Eyes: No visual loss, blurred vision, double vision or yellow sclerae.No hearing loss, sneezing, congestion, runny nose or sore throat.  SKIN: No rash or itching.  CARDIOVASCULAR: per HPI RESPIRATORY: No shortness of breath, cough or sputum.  GASTROINTESTINAL: No anorexia, nausea, vomiting or diarrhea. No abdominal pain or blood.  GENITOURINARY: No burning on urination, no polyuria NEUROLOGICAL: No headache, dizziness, syncope, paralysis, ataxia, numbness or tingling in the extremities. No change in bowel or bladder control.  MUSCULOSKELETAL: No muscle, back pain, joint pain or stiffness.  LYMPHATICS: No enlarged nodes. No history of splenectomy.  PSYCHIATRIC: No history of depression or anxiety.  ENDOCRINOLOGIC: No reports of sweating, cold or heat intolerance. No polyuria or polydipsia.  Marland Kitchen   Physical Examination Filed Vitals:   11/02/14 1014  BP: 116/68  Pulse: 96   Filed Vitals:   11/02/14 1014  Height:  (1.575 m)  Weight: 112 lb 3.2 oz (50.894 kg)    Gen: resting comfortably, no acute distress HEENT: no scleral icterus, pupils equal round and reactive, no palptable cervical adenopathy,  CV: irreg, 3/6 systolic murmur RUSB, no JVD Resp: Clear to auscultation bilaterally GI: abdomen is soft, non-tender, non-distended, normal bowel sounds, no  hepatosplenomegaly MSK: extremities are warm, no edema.  Skin: warm, no rash Neuro:  no focal deficits Psych: appropriate affect   Diagnostic Studies  01/2013 Echo  LVEF 55-60%, akinesis basalinferolateral myocardium, grade II diastolic dysfunction,     Assessment and Plan  1. Afib  - no current symptoms  - He has been managed on every other day digoxin due to soft bp's on beta blockers and CCBs.  - continue ASA, he has not wanted anticoag.   2. HTN  - at goal, continue current meds  3. Aortic stenosis  - mild to moderate by recent echo, though technically limited  - he states he would not want any form of intervention at any time. Will not repeat echo.   4. Diastolic dysfunction  - grade II on most recent echo  - appears euvolemic on exam, no signficant symptoms  - continue daily HCTZ.   F/u 6 months   Antoine Poche, M.D.

## 2014-11-02 NOTE — Patient Instructions (Signed)
Your physician wants you to follow-up in: 6 months with Dr.Branch You will receive a reminder letter in the mail two months in advance. If you don't receive a letter, please call our office to schedule the follow-up appointment.   Your physician recommends that you continue on your current medications as directed. Please refer to the Current Medication list given to you today.    Thank you for choosing [facility] !        

## 2014-11-08 ENCOUNTER — Other Ambulatory Visit: Payer: Self-pay | Admitting: "Endocrinology

## 2014-11-08 ENCOUNTER — Ambulatory Visit (INDEPENDENT_AMBULATORY_CARE_PROVIDER_SITE_OTHER): Payer: Commercial Managed Care - HMO | Admitting: Family Medicine

## 2014-11-08 ENCOUNTER — Encounter: Payer: Self-pay | Admitting: Family Medicine

## 2014-11-08 VITALS — BP 118/76 | Ht 61.0 in | Wt 112.5 lb

## 2014-11-08 DIAGNOSIS — K2100 Gastro-esophageal reflux disease with esophagitis, without bleeding: Secondary | ICD-10-CM

## 2014-11-08 DIAGNOSIS — E038 Other specified hypothyroidism: Secondary | ICD-10-CM | POA: Diagnosis not present

## 2014-11-08 DIAGNOSIS — Z23 Encounter for immunization: Secondary | ICD-10-CM | POA: Diagnosis not present

## 2014-11-08 DIAGNOSIS — R21 Rash and other nonspecific skin eruption: Secondary | ICD-10-CM

## 2014-11-08 DIAGNOSIS — K219 Gastro-esophageal reflux disease without esophagitis: Secondary | ICD-10-CM

## 2014-11-08 DIAGNOSIS — E032 Hypothyroidism due to medicaments and other exogenous substances: Secondary | ICD-10-CM | POA: Diagnosis not present

## 2014-11-08 DIAGNOSIS — I10 Essential (primary) hypertension: Secondary | ICD-10-CM | POA: Diagnosis not present

## 2014-11-08 DIAGNOSIS — E0590 Thyrotoxicosis, unspecified without thyrotoxic crisis or storm: Secondary | ICD-10-CM | POA: Diagnosis not present

## 2014-11-08 MED ORDER — TRIAMCINOLONE ACETONIDE 0.1 % EX OINT: TOPICAL_OINTMENT | CUTANEOUS | Status: DC

## 2014-11-08 MED ORDER — HYDROCHLOROTHIAZIDE 12.5 MG PO CAPS: 12.5000 mg | ORAL_CAPSULE | Freq: Every day | ORAL | Status: DC

## 2014-11-08 MED ORDER — BENAZEPRIL HCL 5 MG PO TABS: 5.0000 mg | ORAL_TABLET | Freq: Every day | ORAL | Status: DC

## 2014-11-08 MED ORDER — ESOMEPRAZOLE MAGNESIUM 40 MG PO CPDR: 40.0000 mg | DELAYED_RELEASE_CAPSULE | Freq: Every day | ORAL | Status: DC

## 2014-11-08 NOTE — Progress Notes (Signed)
   Subjective:    Patient ID: Jeffrey Mccarty, male    DOB: October 18, 1921, 81 y.o.   MRN: [id number]  Hypertension This is a chronic problem. The current episode started more than 1 year ago. There are no compliance problems.    compliant with blood pressure medication. No obvious side effect watching salt intake.   Next. Family continues to be concerned about diet. Still using ensure supplement.  Patient expansion ongoing challenges with eczema of hip. Uses steroid cream helps a lot. Would like refill.   Compliant with Nexium. No obvious side effects. Helps is acid reflux. States she definitely needs it.   Patient states no concerns this visit. Patient would like to get flu shot this visit.  Ongoing challenges with hearing. Family concern. Patient resistant to hearing aid Review of Systems No headache no chest pain no back pain no abdominal pain rare shortness of breath with exertion    Objective:   Physical Exam  Alert vital stable HEENT normal. Neck supple lungs clear. Heart regular in rhythm. Ankles without edema. Abdomen benign right hip eczematous changes.  impression 1 hypertension good control discussed #2 reflux good control discussed #3 chronic eczema stable on meds  Plan flu shot today. Steroid creams refilled. Other medicines refilled. Diet exercise discussed in encourage recheck in 6 months. WSL    Assessment & Plan:

## 2014-11-11 LAB — TSH: TSH: 4.32 u[IU]/mL (ref 0.450–4.500)

## 2014-11-11 LAB — T4, FREE: FREE T4: 1.42 ng/dL (ref 0.82–1.77)

## 2014-11-12 ENCOUNTER — Encounter: Payer: Self-pay | Admitting: "Endocrinology

## 2014-11-12 ENCOUNTER — Ambulatory Visit (INDEPENDENT_AMBULATORY_CARE_PROVIDER_SITE_OTHER): Payer: Commercial Managed Care - HMO | Admitting: "Endocrinology

## 2014-11-12 VITALS — BP 129/77 | HR 65 | Ht 62.0 in | Wt 112.0 lb

## 2014-11-12 DIAGNOSIS — E890 Postprocedural hypothyroidism: Secondary | ICD-10-CM | POA: Insufficient documentation

## 2014-11-12 DIAGNOSIS — E032 Hypothyroidism due to medicaments and other exogenous substances: Secondary | ICD-10-CM | POA: Diagnosis not present

## 2014-11-12 MED ORDER — LEVOTHYROXINE SODIUM 75 MCG PO TABS: 75.0000 ug | ORAL_TABLET | Freq: Every day | ORAL | Status: DC

## 2014-11-12 NOTE — Progress Notes (Signed)
HPI  Jeffrey Mccarty is a 81 y.o.-year-old male here to f/u for his RAI induced hypothyroidism.   81 yr old male with hx of thyrotoxicosis. he is s/p i131 therapy on Jun 19, 2013.  he is here for f/u of RAI hypothyroidism. he is on levothyroxine 75 g by mouth every morning. He continues to feel better , compliant. No new complaint. H has steady weight. he denies palpitations, nor heat intolerance.  His hx is significant for remote past hx of partial thyroidectomy 40 years ago for reported "thyroid storm" and recent hx of a-fib. No hx of thyroid cancer in family. denies any exposure to neck radiation.  I reviewed pt's thyroid tests: Lab Results  Component Value Date   TSH 4.320 11/08/2014   TSH 3.150 05/20/2014   TSH 0.017* 01/31/2013   FREET4 1.42 11/08/2014   FREET4 1.45 01/31/2013     Pt denies cold intolerance, constipation, weight change.   ROS: Constitutional: no weight gain/loss, no fatigue, no subjective hyperthermia/hypothermia Eyes: no blurry vision, no xerophthalmia ENT: no sore throat, no nodules palpated in throat, no dysphagia/odynophagia, no hoarseness Cardiovascular: no CP/SOB/palpitations/leg swelling Respiratory: no cough/SOB Gastrointestinal: no N/V/D/C Musculoskeletal: no muscle/joint aches Skin: no rashes Neurological: no tremors/numbness/tingling/dizziness Psychiatric: no depression/anxiety  PE: BP 129/77 mmHg  Pulse 65  Ht 5\' 2"  (1.575 m)  Wt 112 lb (50.803 kg)  BMI 20.48 kg/m2  SpO2 98% Wt Readings from Last 3 Encounters:  11/12/14 112 lb (50.803 kg)  11/08/14 112 lb 8 oz (51.03 kg)  11/02/14 112 lb 3.2 oz (50.894 kg)   Constitutional:An elderly month, in no acute distress. HEENT: PERRLA, EOMI, no exophthalmos ENT: moist mucous membranes, no thyromegaly, no cervical lymphadenopathy Cardiovascular: RRR, No MRG Respiratory: CTA B Gastrointestinal: abdomen soft, NT, ND, BS+ Musculoskeletal: no deformities, strength intact in all 4 Skin:  moist, warm, no rashes Neurological: no tremor with outstretched hands, DTR normal in all 4  ASSESSMENT: 1.  RAI induced Hypothyroidism  PLAN:   He is clinically euthyroid, TSH 4.3 free T4 1 0.4. I would continue on levothyroxine 75 g  by mouth every morning - We discussed about correct intake of levothyroxine, at fasting, with water, separated by at least 30 minutes from breakfast, and separated by more than 4 hours from calcium, iron, multivitamins, acid reflux medications (PPIs). -Patient is made aware of the fact that thyroid hormone replacement is needed for life, dose to be adjusted by periodic monitoring of thyroid function tests. - Will check thyroid tests before next visit: TSH, free T4   Marquis LunchGebre Khrista Braun, MD Phone: [phone]  Fax: [phone]   11/12/2014, 10:10 AM

## 2014-11-30 DIAGNOSIS — R972 Elevated prostate specific antigen [PSA]: Secondary | ICD-10-CM | POA: Diagnosis not present

## 2014-12-04 ENCOUNTER — Other Ambulatory Visit: Payer: Self-pay | Admitting: Family Medicine

## 2014-12-07 ENCOUNTER — Ambulatory Visit (INDEPENDENT_AMBULATORY_CARE_PROVIDER_SITE_OTHER): Payer: Commercial Managed Care - HMO | Admitting: Urology

## 2014-12-07 DIAGNOSIS — N403 Nodular prostate with lower urinary tract symptoms: Secondary | ICD-10-CM | POA: Diagnosis not present

## 2014-12-07 DIAGNOSIS — N400 Enlarged prostate without lower urinary tract symptoms: Secondary | ICD-10-CM | POA: Diagnosis not present

## 2014-12-07 DIAGNOSIS — R972 Elevated prostate specific antigen [PSA]: Secondary | ICD-10-CM | POA: Diagnosis not present

## 2014-12-07 DIAGNOSIS — N320 Bladder-neck obstruction: Secondary | ICD-10-CM

## 2014-12-10 ENCOUNTER — Telehealth: Payer: Self-pay | Admitting: Family Medicine

## 2014-12-10 MED ORDER — PREDNISONE 10 MG PO TABS: ORAL_TABLET | ORAL | Status: DC

## 2014-12-10 NOTE — Telephone Encounter (Signed)
[facility] Called [name] and notified her.

## 2014-12-10 NOTE — Telephone Encounter (Signed)
pred 10 four daily for three d, three daily for three d two daily for two d

## 2014-12-10 NOTE — Telephone Encounter (Signed)
Daughter(Donna) called stating patient has flare-up with gout and wanting something called in. Was last seen 11/08/14. [facility].

## 2015-05-04 ENCOUNTER — Ambulatory Visit (INDEPENDENT_AMBULATORY_CARE_PROVIDER_SITE_OTHER): Payer: Commercial Managed Care - HMO | Admitting: Cardiology

## 2015-05-04 ENCOUNTER — Encounter: Payer: Self-pay | Admitting: Cardiology

## 2015-05-04 VITALS — BP 120/70 | HR 88 | Ht 59.0 in | Wt 114.0 lb

## 2015-05-04 DIAGNOSIS — I350 Nonrheumatic aortic (valve) stenosis: Secondary | ICD-10-CM

## 2015-05-04 DIAGNOSIS — I519 Heart disease, unspecified: Secondary | ICD-10-CM

## 2015-05-04 DIAGNOSIS — I4891 Unspecified atrial fibrillation: Secondary | ICD-10-CM

## 2015-05-04 DIAGNOSIS — I5189 Other ill-defined heart diseases: Secondary | ICD-10-CM

## 2015-05-04 DIAGNOSIS — I10 Essential (primary) hypertension: Secondary | ICD-10-CM

## 2015-05-04 MED ORDER — APIXABAN 2.5 MG PO TABS: 2.5000 mg | ORAL_TABLET | Freq: Two times a day (BID) | ORAL | Status: DC

## 2015-05-04 NOTE — Progress Notes (Signed)
Patient ID: Jeffrey Mccarty, male   DOB: Jul 22, 1921, 82 y.o.   MRN: [id number]     Clinical Summary Jeffrey Mccarty is a 82 y.o.male seen today for follow up of the following medical problems.   1. Afib  - blood pressure has had difficulty tolerating beta blockers or CCBs during prior admission, he has actually been only on dig 0.125 mg every other day and doing well for quite some time - CHADS2 score of 3, he has refused anticoag and chosen to stay on ASA - he remains active. He [place] "the barn" to listen to country music. No SOB. No chest pain. No dizziness - no palpitations. Taking meds daily.   2. HTN  - does not check at home  - he is compliant with meds   3. Mild to moderate aortic stenosis  - poorly visuazlied on most recent echo, mean gradient of 21 mmHg suggestive of moderate but technically limited.  - denies any chest pain, SOB/DOE, or syncope   4. Diastolic dysfunction  - grade 2 on most recent echo  - denies any orthopnea, PND, no LE edema Past Medical History  Diagnosis Date  . Hypertension   . Hypothyroid   . Reflux   . Kidney stone   . Polymyalgia rheumatica (HCC)   . Insomnia   . Pressure ulcer   . Elevated PSA   . Atrial fibrillation (HCC)      Allergies  Allergen Reactions  . Cardura [Doxazosin Mesylate]      Current Outpatient Prescriptions  Medication Sig Dispense Refill  . aspirin 325 MG EC tablet Take 325 mg by mouth daily.    . benazepril (LOTENSIN) 5 MG tablet Take 1 tablet (5 mg total) by mouth daily. 30 tablet 5  . DIGITEK 125 MCG tablet take 1 tablet by mouth every other day 15 tablet 5  . esomeprazole (NEXIUM) 40 MG capsule Take 1 capsule (40 mg total) by mouth daily. 90 capsule 1  . feeding supplement, ENSURE COMPLETE, (ENSURE COMPLETE) LIQD Take 237 mLs by mouth 2 (two) times daily between meals. 30 Bottle 1  . hydrochlorothiazide (MICROZIDE) 12.5 MG capsule Take 1 capsule (12.5 mg total) by mouth daily. 30 capsule 5  .  levothyroxine (SYNTHROID, LEVOTHROID) 75 MCG tablet Take 1 tablet (75 mcg total) by mouth daily before breakfast. 30 tablet 6  . predniSONE (DELTASONE) 10 MG tablet Take 4 tablets for three days, then take 3 tablets for three days, then take 2 tablets for 2 days. 25 tablet 0  . triamcinolone ointment (KENALOG) 0.1 % apply to affected area twice a day 60 g 3   No current facility-administered medications for this visit.     Past Surgical History  Procedure Laterality Date  . Appendectomy    . Cholecystectomy    . Thyroidectomy    . Tonsillectomy       Allergies  Allergen Reactions  . Cardura [Doxazosin Mesylate]       Family History  Problem Relation Age of Onset  . Heart attack Father      Social History Jeffrey Mccarty reports that he has never smoked. He has never used smokeless tobacco. Jeffrey Mccarty reports that he does not drink alcohol.   Review of Systems CONSTITUTIONAL: No weight loss, fever, chills, weakness or fatigue.  HEENT: Eyes: No visual loss, blurred vision, double vision or yellow sclerae.No hearing loss, sneezing, congestion, runny nose or sore throat.  SKIN: No rash or itching.  CARDIOVASCULAR: per HPI  RESPIRATORY: No shortness of breath, cough or sputum.  GASTROINTESTINAL: No anorexia, nausea, vomiting or diarrhea. No abdominal pain or blood.  GENITOURINARY: No burning on urination, no polyuria NEUROLOGICAL: No headache, dizziness, syncope, paralysis, ataxia, numbness or tingling in the extremities. No change in bowel or bladder control.  MUSCULOSKELETAL: No muscle, back pain, joint pain or stiffness.  LYMPHATICS: No enlarged nodes. No history of splenectomy.  PSYCHIATRIC: No history of depression or anxiety.  ENDOCRINOLOGIC: No reports of sweating, cold or heat intolerance. No polyuria or polydipsia.  Marland Kitchen   Physical Examination Filed Vitals:   05/04/15 1255  BP: 120/70  Pulse: 88   Filed Vitals:   05/04/15 1255  Height:  (1.499 m)    Weight: 114 lb (51.71 kg)    Gen: resting comfortably, no acute distress HEENT: no scleral icterus, pupils equal round and reactive, no palptable cervical adenopathy,  CV: RRR, 2/6 systolic murmur RUSB, no jvd Resp: Clear to auscultation bilaterally GI: abdomen is soft, non-tender, non-distended, normal bowel sounds, no hepatosplenomegaly MSK: extremities are warm, no edema.  Skin: warm, no rash Neuro:  no focal deficits Psych: appropriate affect   Diagnostic Studies 01/2013 Echo  LVEF 55-60%, akinesis basalinferolateral myocardium, grade II diastolic dysfunction,     Assessment and Plan  1. Afib  - no current symptoms  - EKG in clinic shows SR with PACs with aberrancy - He has been managed on every other day digoxin due to soft bp's on beta blockers and CCBs.  - he now is interested in anticoagulation after a long discussion with he and his family about the risks and benefits. He will stop ASA and start eliquis 2.5mg  bid (renal function right around 1.46, age 3, 51 kg). Renal function just shy of the 1.5 cut off, we will dose conservatively at 2.5mg  bid.   2. HTN  - at goal, continue current meds  3. Aortic stenosis  - mild to moderate by recent echo, though technically limited  - we will repeat echo  4. Diastolic dysfunction  - grade II on most recent echo  - no current symptoms. Continue current diuretics.      Antoine Poche, M.D.

## 2015-05-04 NOTE — Patient Instructions (Addendum)
Medication Instructions:  START ELIQUIS 2.5 MG TWO TIMES DAILY. STOP ASPIRIN   Labwork: none  Testing/Procedures: Your physician has requested that you have an echocardiogram. Echocardiography is a painless test that uses sound waves to create images of your heart. It provides your doctor with information about the size and shape of your heart and how well your heart's chambers and valves are working. This procedure takes approximately one hour. There are no restrictions for this procedure.    Follow-Up: Your physician wants you to follow-up in: 6 months with Dr. Wyline MoodBranch.  You will receive a reminder letter in the mail two months in advance. If you don't receive a letter, please call our office to schedule the follow-up appointment.   Any Other Special Instructions Will Be Listed Below (If Applicable).   YOU WERE GIVEN SAMPLES OF ELIQUIS 2.5 MG TODAY   If you need a refill on your cardiac medications before your next appointment, please call your pharmacy.

## 2015-05-09 ENCOUNTER — Encounter: Payer: Self-pay | Admitting: Family Medicine

## 2015-05-09 ENCOUNTER — Ambulatory Visit (HOSPITAL_COMMUNITY)
Admission: RE | Admit: 2015-05-09 | Discharge: 2015-05-09 | Disposition: A | Discharge status: Home / Self Care | Payer: Commercial Managed Care - HMO | Source: Ambulatory Visit | Attending: Cardiology | Admitting: Cardiology

## 2015-05-09 ENCOUNTER — Ambulatory Visit (INDEPENDENT_AMBULATORY_CARE_PROVIDER_SITE_OTHER): Payer: Commercial Managed Care - HMO | Admitting: Family Medicine

## 2015-05-09 VITALS — BP 128/70 | Ht 61.0 in | Wt 114.0 lb

## 2015-05-09 DIAGNOSIS — K219 Gastro-esophageal reflux disease without esophagitis: Secondary | ICD-10-CM | POA: Diagnosis not present

## 2015-05-09 DIAGNOSIS — E039 Hypothyroidism, unspecified: Secondary | ICD-10-CM | POA: Diagnosis not present

## 2015-05-09 DIAGNOSIS — I119 Hypertensive heart disease without heart failure: Secondary | ICD-10-CM | POA: Insufficient documentation

## 2015-05-09 DIAGNOSIS — I352 Nonrheumatic aortic (valve) stenosis with insufficiency: Secondary | ICD-10-CM | POA: Diagnosis not present

## 2015-05-09 DIAGNOSIS — I350 Nonrheumatic aortic (valve) stenosis: Secondary | ICD-10-CM | POA: Diagnosis not present

## 2015-05-09 DIAGNOSIS — Z79899 Other long term (current) drug therapy: Secondary | ICD-10-CM | POA: Diagnosis not present

## 2015-05-09 DIAGNOSIS — I340 Nonrheumatic mitral (valve) insufficiency: Secondary | ICD-10-CM | POA: Insufficient documentation

## 2015-05-09 DIAGNOSIS — I10 Essential (primary) hypertension: Secondary | ICD-10-CM

## 2015-05-09 DIAGNOSIS — E785 Hyperlipidemia, unspecified: Secondary | ICD-10-CM

## 2015-05-09 LAB — ECHOCARDIOGRAM COMPLETE
Height: 61 in
Weight: 1824 oz

## 2015-05-09 MED ORDER — BENAZEPRIL HCL 5 MG PO TABS: 5.0000 mg | ORAL_TABLET | Freq: Every day | ORAL | Status: DC

## 2015-05-09 MED ORDER — HYDROCHLOROTHIAZIDE 12.5 MG PO CAPS: 12.5000 mg | ORAL_CAPSULE | Freq: Every day | ORAL | Status: DC

## 2015-05-09 NOTE — Progress Notes (Signed)
   Subjective:    Patient ID: Jeffrey Mccarty, male    DOB: 02/28/1921, 82 y.o.   MRN: [id number]  Pt arrives today with daughter Jeffrey Mccarty.   Hypertension This is a chronic problem. The current episode started more than 1 year ago. Treatments tried: benazepril. There are no compliance problems.    Now off aspirin, on eliquis. On this for his chronic atrial fibrillation. Wonders if he really needs to stay with it and I have advised definitely has  Compliant with diet mostly.  Compliant blood pressure pill no obvious side effect. Watching salt intake.  Followed by Dr. Fransico HimNida for his thyroid challenges  Due to have echo of the heart soon to look at the valv disease Pt states no problems or concerns.    Review of Systems No headache, no major weight loss or weight gain, no chest pain no back pain abdominal pain no change in bowel habits complete ROS otherwise negative     Objective:   Physical Exam Alert thin no acute distress blood pressure good on repeat quite hard of hearing but opposed to hearing aids, lungs clear heart rare rhythm ankles without edema       Assessment & Plan:  Impression 1 hypertension good control discussed maintain same meds #2 reflux ongoing chronic to maintain same #3 atrial fibrillation discussed #4 general concerns regarding diet exercise discussed with family 25 minutes spent most in discussion plan appropriate blood work further recommendations based results meds refilled recheck in 6 months WSL

## 2015-05-11 ENCOUNTER — Other Ambulatory Visit: Payer: Self-pay | Admitting: "Endocrinology

## 2015-05-11 DIAGNOSIS — E032 Hypothyroidism due to medicaments and other exogenous substances: Secondary | ICD-10-CM | POA: Diagnosis not present

## 2015-05-11 DIAGNOSIS — E785 Hyperlipidemia, unspecified: Secondary | ICD-10-CM | POA: Diagnosis not present

## 2015-05-11 DIAGNOSIS — Z79899 Other long term (current) drug therapy: Secondary | ICD-10-CM | POA: Diagnosis not present

## 2015-05-12 LAB — BASIC METABOLIC PANEL
BUN / CREAT RATIO: 23 (ref 10–24)
BUN: 28 mg/dL (ref 10–36)
CHLORIDE: 100 mmol/L (ref 96–106)
CO2: 23 mmol/L (ref 18–29)
Calcium: 9.5 mg/dL (ref 8.6–10.2)
Creatinine, Ser: 1.22 mg/dL (ref 0.76–1.27)
GFR calc non Af Amer: 52 mL/min/{1.73_m2} — ABNORMAL LOW (ref >59)
GFR, EST AFRICAN AMERICAN: 60 mL/min/{1.73_m2} (ref >59)
GLUCOSE: 97 mg/dL (ref 65–99)
POTASSIUM: 4.4 mmol/L (ref 3.5–5.2)
SODIUM: 142 mmol/L (ref 134–144)

## 2015-05-12 LAB — HEPATIC FUNCTION PANEL
ALK PHOS: 39 IU/L (ref 39–117)
ALT: 6 IU/L (ref 0–44)
AST: 19 IU/L (ref 0–40)
Albumin: 4.2 g/dL (ref 3.2–4.6)
BILIRUBIN TOTAL: 0.3 mg/dL (ref 0.0–1.2)
BILIRUBIN, DIRECT: 0.13 mg/dL (ref 0.00–0.40)
Total Protein: 7 g/dL (ref 6.0–8.5)

## 2015-05-12 LAB — LIPID PANEL
CHOL/HDL RATIO: 3.1 ratio (ref 0.0–5.0)
CHOLESTEROL TOTAL: 214 mg/dL — AB (ref 100–199)
HDL: 68 mg/dL (ref >39)
LDL Calculated: 132 mg/dL — ABNORMAL HIGH (ref 0–99)
TRIGLYCERIDES: 68 mg/dL (ref 0–149)
VLDL Cholesterol Cal: 14 mg/dL (ref 5–40)

## 2015-05-12 LAB — T4, FREE: FREE T4: 1.35 ng/dL (ref 0.82–1.77)

## 2015-05-12 LAB — TSH: TSH: 6.25 u[IU]/mL — AB (ref 0.450–4.500)

## 2015-05-15 ENCOUNTER — Encounter: Payer: Self-pay | Admitting: Family Medicine

## 2015-05-18 ENCOUNTER — Encounter: Payer: Self-pay | Admitting: "Endocrinology

## 2015-05-18 ENCOUNTER — Ambulatory Visit (INDEPENDENT_AMBULATORY_CARE_PROVIDER_SITE_OTHER): Payer: Commercial Managed Care - HMO | Admitting: "Endocrinology

## 2015-05-18 VITALS — BP 139/66 | HR 73 | Ht 61.0 in | Wt 115.0 lb

## 2015-05-18 DIAGNOSIS — E032 Hypothyroidism due to medicaments and other exogenous substances: Secondary | ICD-10-CM | POA: Diagnosis not present

## 2015-05-18 MED ORDER — LEVOTHYROXINE SODIUM 75 MCG PO TABS: 75.0000 ug | ORAL_TABLET | Freq: Every day | ORAL | Status: DC

## 2015-05-18 NOTE — Progress Notes (Signed)
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   82 yr old male with hx of thyrotoxicosis. he is s/p i131 therapy on Jun 19, 2013.  he is here for f/u of RAI hypothyroidism. he is on levothyroxine 75 g by mouth every morning. He continues to feel better , compliant. No new complaint. H has steady weight. he denies palpitations, nor heat intolerance. Since his last visit he was initiated on Eliquis, related to his diagnosis of atrial fibrillation. His hx is significant for remote past hx of partial thyroidectomy 40 years ago for reported "thyroid storm" and recent hx of a-fib. No hx of thyroid cancer in family. denies any exposure to neck radiation.  I reviewed pt's thyroid tests: Lab Results  Component Value Date   TSH 6.250* 05/11/2015   TSH 4.320 11/08/2014   TSH 3.150 05/20/2014   TSH 0.017* 01/31/2013   FREET4 1.35 05/11/2015   FREET4 1.42 11/08/2014   FREET4 1.45 01/31/2013     Pt denies cold intolerance, constipation, weight change.   ROS: Constitutional: +weight gain, no fatigue, no subjective hyperthermia/hypothermia Eyes: no blurry vision, no xerophthalmia ENT: no sore throat, no nodules palpated in throat, no dysphagia/odynophagia, no hoarseness Cardiovascular: no CP/SOB/palpitations/leg swelling Respiratory: no cough/SOB Gastrointestinal: no N/V/D/C Musculoskeletal: no muscle/joint aches Skin: no rashes Neurological: no tremors/numbness/tingling/dizziness Psychiatric: no depression/anxiety  PE: BP 139/66 mmHg  Pulse 73  Ht 5\' 1"  (1.549 m)  Wt 115 lb (52.164 kg)  BMI 21.74 kg/m2  SpO2 98% Wt Readings from Last 3 Encounters:  05/18/15 115 lb (52.164 kg)  05/09/15 114 lb (51.71 kg)  05/04/15 114 lb (51.71 kg)   Constitutional:An elderly month, Hard of hearing, in no acute distress. HEENT: PERRLA, EOMI, no exophthalmos ENT: moist mucous membranes, no thyromegaly, no cervical lymphadenopathy Cardiovascular:  RRR, No MRG Respiratory: CTA B Gastrointestinal: abdomen soft, NT, ND, BS+ Musculoskeletal: no deformities, strength intact in all 4 Skin: moist, warm, no rashes Neurological: no tremor with outstretched hands, DTR normal in all 4  Results for Jeffrey EdelsonCARROLL, Gillie C (MRN [id number]) as of 05/18/2015 10:32  Ref. Range 05/11/2015 09:23  TSH Latest Ref Range: 0.450-4.500 uIU/mL 6.250 (H)  T4,Free(Direct) Latest Ref Range: 0.82-1.77 ng/dL 4.091.35    ASSESSMENT: 1.  RAI induced Hypothyroidism  PLAN:   - Although his TSH is off target, he will be kept on same dose of levothyroxine at 75 g. This is in an effort to lower risk of worsening atrial fibrillation by inadvertent over treatment of hypothyroidism.   - We discussed about correct intake of levothyroxine, at fasting, with water, separated by at least 30 minutes from breakfast, and separated by more than 4 hours from calcium, iron, multivitamins, acid reflux medications (PPIs). -Patient is made aware of the fact that thyroid hormone replacement is needed for life, dose to be adjusted by periodic monitoring of thyroid function tests. - Will check thyroid tests before next visit: TSH, free T4   Marquis LunchGebre Breezie Micucci, MD Phone: [phone]  Fax: [phone]   05/18/2015, 10:47 AM

## 2015-05-26 ENCOUNTER — Telehealth: Payer: Self-pay | Admitting: *Deleted

## 2015-05-26 NOTE — Telephone Encounter (Signed)
-----   Message from Antoine PocheJonathan F Branch, MD sent at 05/26/2015 11:14 AM EDT ----- Regarding: RE: result echo Not sure who is off, forwarded to both of you guys. Patient;s echo shows normal heart pumping function, his heart valve remains moderately thickened (aortic stenosis) but overall stable. We will continue to monitor  Dominga FerryJ Branch MD ----- Message -----    From: Fonnie BirkenheadLisa R McGhee, CMA    Sent: 05/25/2015   4:10 PM      To: Antoine PocheJonathan F Branch, MD Subject: result echo                                    Could you please result pt echo? Thanks!

## 2015-05-26 NOTE — Telephone Encounter (Signed)
Pt's daughter notified of ECHO results. Daughter voiced understanding.

## 2015-05-28 ENCOUNTER — Other Ambulatory Visit: Payer: Self-pay | Admitting: Family Medicine

## 2015-07-13 ENCOUNTER — Other Ambulatory Visit: Payer: Self-pay | Admitting: Family Medicine

## 2015-10-29 ENCOUNTER — Other Ambulatory Visit: Payer: Self-pay | Admitting: Family Medicine

## 2015-11-02 ENCOUNTER — Ambulatory Visit (INDEPENDENT_AMBULATORY_CARE_PROVIDER_SITE_OTHER): Payer: Commercial Managed Care - HMO | Admitting: Cardiology

## 2015-11-02 ENCOUNTER — Encounter: Payer: Self-pay | Admitting: Cardiology

## 2015-11-02 VITALS — BP 132/68 | HR 56 | Ht 60.0 in | Wt 110.0 lb

## 2015-11-02 DIAGNOSIS — I350 Nonrheumatic aortic (valve) stenosis: Secondary | ICD-10-CM | POA: Diagnosis not present

## 2015-11-02 DIAGNOSIS — I10 Essential (primary) hypertension: Secondary | ICD-10-CM

## 2015-11-02 DIAGNOSIS — I4891 Unspecified atrial fibrillation: Secondary | ICD-10-CM | POA: Diagnosis not present

## 2015-11-02 NOTE — Patient Instructions (Signed)

## 2015-11-02 NOTE — Progress Notes (Signed)
Clinical Summary Mr. Kloss is a 82 y.o.male seen today for follow up of the following medical problems.   1. Afib  - blood pressure has had difficulty tolerating beta blockers or CCBs during prior admission, he has actually been only on dig 0.125 mg every other day and doing well for quite some time - CHADS2 score of 3, he is on eliquis - he remains active. He [place]the barn" to listen to country music.   - no palpitations since last visit.  - compliant with meds  2. HTN  - does not check blood pressure at home  - he is compliant with meds   3. Mild to moderate aortic stenosis  - 04/2015 echo moderate AS. AVA VTI 1.14, mean grad 25 - poorly visuazlied on most recent echo, mean gradient of 21 mmHg suggestive of moderate but technically limited.   - denies any chest pain, SOB/DOE, or syncope   4. Diastolic dysfunction  - grade 2 on most recent echo  - no recent SOB,DOE, or LE edema    Upcoming labs with pcp Past Medical History:  Diagnosis Date  . Atrial fibrillation (HCC)   . Elevated PSA   . Hypertension   . Hypothyroid   . Insomnia   . Kidney stone   . Polymyalgia rheumatica (HCC)   . Pressure ulcer   . Reflux      Allergies  Allergen Reactions  . Cardura [Doxazosin Mesylate]      Current Outpatient Prescriptions  Medication Sig Dispense Refill  . apixaban (ELIQUIS) 2.5 MG TABS tablet Take 1 tablet (2.5 mg total) by mouth 2 (two) times daily. 90 tablet 3  . benazepril (LOTENSIN) 5 MG tablet take 1 tablet by mouth once daily 30 tablet 5  . DIGITEK 125 MCG tablet take 1 tablet by mouth every other day 15 tablet 5  . esomeprazole (NEXIUM) 40 MG capsule take 1 capsule by mouth once daily 90 capsule 1  . feeding supplement, ENSURE COMPLETE, (ENSURE COMPLETE) LIQD Take 237 mLs by mouth 2 (two) times daily between meals. 30 Bottle 1  . hydrochlorothiazide (MICROZIDE) 12.5 MG capsule take 1 capsule by mouth once daily 30 capsule 5  .  levothyroxine (SYNTHROID, LEVOTHROID) 75 MCG tablet Take 1 tablet (75 mcg total) by mouth daily before breakfast. 30 tablet 6  . triamcinolone ointment (KENALOG) 0.1 % apply to affected area twice a day 60 g 3   No current facility-administered medications for this visit.      Past Surgical History:  Procedure Laterality Date  . APPENDECTOMY    . CHOLECYSTECTOMY    . THYROIDECTOMY    . TONSILLECTOMY       Allergies  Allergen Reactions  . Cardura [Doxazosin Mesylate]       Family History  Problem Relation Age of Onset  . Heart attack Father      Social History Mr. Giangrande reports that he has never smoked. He has never used smokeless tobacco. Mr. Scearce reports that he does not drink alcohol.   Review of Systems CONSTITUTIONAL: No weight loss, fever, chills, weakness or fatigue.  HEENT: Eyes: No visual loss, blurred vision, double vision or yellow sclerae.No hearing loss, sneezing, congestion, runny nose or sore throat.  SKIN: No rash or itching.  CARDIOVASCULAR: per HPI RESPIRATORY: No shortness of breath, cough or sputum.  GASTROINTESTINAL: No anorexia, nausea, vomiting or diarrhea. No abdominal pain or blood.  GENITOURINARY: No burning on urination, no polyuria NEUROLOGICAL: No  headache, dizziness, syncope, paralysis, ataxia, numbness or tingling in the extremities. No change in bowel or bladder control.  MUSCULOSKELETAL: No muscle, back pain, joint pain or stiffness.  LYMPHATICS: No enlarged nodes. No history of splenectomy.  PSYCHIATRIC: No history of depression or anxiety.  ENDOCRINOLOGIC: No reports of sweating, cold or heat intolerance. No polyuria or polydipsia.  Marland Kitchen.   Physical Examination Vitals:   11/02/15 1113  BP: 132/68  Pulse: (!) 56   Vitals:   11/02/15 1113  Weight: 110 lb (49.9 kg)  Height: 5' (1.524 m)    Gen: resting comfortably, no acute distress HEENT: no scleral icterus, pupils equal round and reactive, no palptable cervical  adenopathy,  CV: RRR, no m/r/g, no jvd Resp: Clear to auscultation bilaterally GI: abdomen is soft, non-tender, non-distended, normal bowel sounds, no hepatosplenomegaly MSK: extremities are warm, no edema.  Skin: warm, no rash Neuro:  no focal deficits Psych: appropriate affect   Diagnostic Studies 01/2013 Echo  LVEF 55-60%, akinesis basalinferolateral myocardium, grade II diastolic dysfunction,    04/2015 echo Study Conclusions  - Left ventricle: The cavity size was normal. There was focal basal   hypertrophy. Systolic function was normal. The estimated ejection   fraction was in the range of 55% to 60%. Doppler parameters are   consistent with abnormal left ventricular relaxation (grade 1   diastolic dysfunction). - Aortic valve: Moderately calcified annulus. Trileaflet;   moderately thickened leaflets. There was moderate stenosis. There   was mild to moderate regurgitation. Valve area (VTI): 1.14 cm^2.   Valve area (Vmax): 1.18 cm^2. Valve area (Vmean): 0.88 cm^2. - Mitral valve: There was mild to moderate regurgitation. - Left atrium: The atrium was severely dilated. - Right atrium: The atrium was moderately dilated. - Technically adequate study.  Assessment and Plan  1. Afib  - no current symptoms  - He has been managed on every other day digoxin due to soft bp's on beta blockers and CCBs.  - CHADS2Vasc score is 3, continue eliquis.   2. HTN  - he is at goal, he will continue current meds  3. Aortic stenosis  - no recent symptoms, continue to monitor  4. Diastolic dysfunction  - grade II on most recent echo  - no current symptoms.  - continue to monitor   F/u 6 months   Antoine PocheJonathan F. Kassey Laforest, M.D

## 2015-11-08 ENCOUNTER — Ambulatory Visit: Payer: Commercial Managed Care - HMO | Admitting: Family Medicine

## 2015-11-08 ENCOUNTER — Telehealth: Payer: Self-pay | Admitting: Family Medicine

## 2015-11-08 DIAGNOSIS — Z13220 Encounter for screening for lipoid disorders: Secondary | ICD-10-CM

## 2015-11-08 DIAGNOSIS — Z79899 Other long term (current) drug therapy: Secondary | ICD-10-CM

## 2015-11-08 DIAGNOSIS — E039 Hypothyroidism, unspecified: Secondary | ICD-10-CM

## 2015-11-08 NOTE — Telephone Encounter (Signed)
Digoxin tsh met7 liv lip

## 2015-11-08 NOTE — Telephone Encounter (Signed)
Pt missed appt today for med check   Need labs ordered for appt 11/28/2015  Please call when done

## 2015-11-08 NOTE — Telephone Encounter (Signed)
Orders ready. Tried to call number busy times 2

## 2015-11-09 NOTE — Telephone Encounter (Signed)
Patient notified

## 2015-11-10 ENCOUNTER — Other Ambulatory Visit: Payer: Self-pay | Admitting: "Endocrinology

## 2015-11-10 DIAGNOSIS — E032 Hypothyroidism due to medicaments and other exogenous substances: Secondary | ICD-10-CM | POA: Diagnosis not present

## 2015-11-10 LAB — T4, FREE: Free T4: 1.3 ng/dL (ref 0.8–1.8)

## 2015-11-10 LAB — TSH: TSH: 3.44 mIU/L (ref 0.40–4.50)

## 2015-11-17 ENCOUNTER — Encounter: Payer: Self-pay | Admitting: "Endocrinology

## 2015-11-17 ENCOUNTER — Other Ambulatory Visit: Payer: Self-pay | Admitting: Cardiology

## 2015-11-17 ENCOUNTER — Ambulatory Visit (INDEPENDENT_AMBULATORY_CARE_PROVIDER_SITE_OTHER): Payer: Commercial Managed Care - HMO | Admitting: "Endocrinology

## 2015-11-17 VITALS — BP 118/79 | Ht 60.0 in | Wt 112.0 lb

## 2015-11-17 DIAGNOSIS — E890 Postprocedural hypothyroidism: Secondary | ICD-10-CM

## 2015-11-17 NOTE — Progress Notes (Signed)
HPI  Jeffrey Mccarty is a 82 y.o.-year-old male here to f/u for his RAI induced hypothyroidism.   [age] yr old male with hx of thyrotoxicosis. he is s/p i131 therapy on Jun 19, 2013.  he is here for f/u of RAI hypothyroidism. he is on levothyroxine 75 g by mouth every morning. He continues to feel better..  No new complaint. He has steady weight. he denies palpitations, nor heat intolerance. Since his last visit he was initiated on Eliquis, related to his diagnosis of atrial fibrillation. His hx is significant for remote past hx of partial thyroidectomy 40 years ago for reported "thyroid storm" and recent hx of a-fib. No hx of thyroid cancer in family. denies any exposure to neck radiation.  I reviewed pt's thyroid tests: Lab Results  Component Value Date   TSH 3.44 11/10/2015   TSH 6.250 (H) 05/11/2015   TSH 4.320 11/08/2014   TSH 3.150 05/20/2014   TSH 0.017 (L) 01/31/2013   FREET4 1.3 11/10/2015   FREET4 1.35 05/11/2015   FREET4 1.42 11/08/2014   FREET4 1.45 01/31/2013     Pt denies cold intolerance, constipation, weight change.   ROS: Constitutional: +weight gain, no fatigue, no subjective hyperthermia/hypothermia Eyes: no blurry vision, no xerophthalmia ENT: no sore throat, no nodules palpated in throat, no dysphagia/odynophagia, no hoarseness Cardiovascular: no CP/SOB/palpitations/leg swelling Respiratory: no cough/SOB Gastrointestinal: no N/V/D/C Musculoskeletal: no muscle/joint aches Skin: no rashes Neurological: no tremors/numbness/tingling/dizziness Psychiatric: no depression/anxiety  PE: BP 118/79   Ht 5' (1.524 m)   Wt 112 lb (50.8 kg)   BMI 21.87 kg/m  Wt Readings from Last 3 Encounters:  11/17/15 112 lb (50.8 kg)  11/02/15 110 lb (49.9 kg)  05/18/15 115 lb (52.2 kg)   Constitutional:An elderly month, Hard of hearing, in no acute distress. HEENT: PERRLA, EOMI, no exophthalmos ENT: moist mucous membranes, no thyromegaly, no cervical  lymphadenopathy Cardiovascular: RRR, No MRG Respiratory: CTA B Gastrointestinal: abdomen soft, NT, ND, BS+ Musculoskeletal: no deformities, strength intact in all 4 Skin: moist, warm, no rashes Neurological: no tremor with outstretched hands, DTR normal in all 4  Recent Results (from the past 2160 hour(s))  TSH     Status: None   Collection Time: 11/10/15 10:42 AM  Result Value Ref Range   TSH 3.44 0.40 - 4.50 mIU/L  T4, free     Status: None   Collection Time: 11/10/15 10:42 AM  Result Value Ref Range   Free T4 1.3 0.8 - 1.8 ng/dL   ASSESSMENT: 1.  RAI induced Hypothyroidism - His thyroid function tests are consistent with appropriate replacement. I will continue levothyroxine 75 g by mouth every morning. - We discussed about correct intake of levothyroxine, at fasting, with water, separated by at least 30 minutes from breakfast, and separated by more than 4 hours from calcium, iron, multivitamins, acid reflux medications (PPIs). -Patient is made aware of the fact that thyroid hormone replacement is needed for life, dose to be adjusted by periodic monitoring of thyroid function tests.  Jeffrey LunchGebre Jeffrey Delagarza, MD Phone: [phone]  Fax: [phone]   11/17/2015, 11:07 AM

## 2015-11-21 DIAGNOSIS — E039 Hypothyroidism, unspecified: Secondary | ICD-10-CM | POA: Diagnosis not present

## 2015-11-21 DIAGNOSIS — Z79899 Other long term (current) drug therapy: Secondary | ICD-10-CM | POA: Diagnosis not present

## 2015-11-21 DIAGNOSIS — Z13220 Encounter for screening for lipoid disorders: Secondary | ICD-10-CM | POA: Diagnosis not present

## 2015-11-22 LAB — BASIC METABOLIC PANEL
BUN / CREAT RATIO: 21 (ref 10–24)
BUN: 27 mg/dL (ref 10–36)
CO2: 24 mmol/L (ref 18–29)
Calcium: 9.9 mg/dL (ref 8.6–10.2)
Chloride: 102 mmol/L (ref 96–106)
Creatinine, Ser: 1.29 mg/dL — ABNORMAL HIGH (ref 0.76–1.27)
GFR calc Af Amer: 56 mL/min/{1.73_m2} — ABNORMAL LOW (ref >59)
GFR, EST NON AFRICAN AMERICAN: 48 mL/min/{1.73_m2} — AB (ref >59)
GLUCOSE: 91 mg/dL (ref 65–99)
POTASSIUM: 4.6 mmol/L (ref 3.5–5.2)
SODIUM: 143 mmol/L (ref 134–144)

## 2015-11-22 LAB — DIGOXIN LEVEL: DIGOXIN, SERUM: 0.5 ng/mL (ref 0.5–0.9)

## 2015-11-22 LAB — HEPATIC FUNCTION PANEL
ALT: 11 IU/L (ref 0–44)
AST: 21 IU/L (ref 0–40)
Albumin: 4.3 g/dL (ref 3.2–4.6)
Alkaline Phosphatase: 41 IU/L (ref 39–117)
BILIRUBIN, DIRECT: 0.12 mg/dL (ref 0.00–0.40)
Bilirubin Total: 0.4 mg/dL (ref 0.0–1.2)
TOTAL PROTEIN: 7.3 g/dL (ref 6.0–8.5)

## 2015-11-22 LAB — TSH: TSH: 3.37 u[IU]/mL (ref 0.450–4.500)

## 2015-11-22 LAB — LIPID PANEL
CHOL/HDL RATIO: 3.1 ratio (ref 0.0–5.0)
Cholesterol, Total: 207 mg/dL — ABNORMAL HIGH (ref 100–199)
HDL: 66 mg/dL (ref >39)
LDL Calculated: 127 mg/dL — ABNORMAL HIGH (ref 0–99)
TRIGLYCERIDES: 70 mg/dL (ref 0–149)
VLDL Cholesterol Cal: 14 mg/dL (ref 5–40)

## 2015-11-26 ENCOUNTER — Other Ambulatory Visit: Payer: Self-pay | Admitting: Family Medicine

## 2015-11-28 ENCOUNTER — Encounter: Payer: Self-pay | Admitting: Family Medicine

## 2015-11-28 ENCOUNTER — Ambulatory Visit (INDEPENDENT_AMBULATORY_CARE_PROVIDER_SITE_OTHER): Payer: Commercial Managed Care - HMO | Admitting: Family Medicine

## 2015-11-28 VITALS — BP 130/80 | Ht 61.0 in | Wt 110.5 lb

## 2015-11-28 DIAGNOSIS — I4891 Unspecified atrial fibrillation: Secondary | ICD-10-CM | POA: Diagnosis not present

## 2015-11-28 DIAGNOSIS — E7800 Pure hypercholesterolemia, unspecified: Secondary | ICD-10-CM | POA: Diagnosis not present

## 2015-11-28 DIAGNOSIS — Z23 Encounter for immunization: Secondary | ICD-10-CM

## 2015-11-28 DIAGNOSIS — I10 Essential (primary) hypertension: Secondary | ICD-10-CM | POA: Diagnosis not present

## 2015-11-28 DIAGNOSIS — K2100 Gastro-esophageal reflux disease with esophagitis, without bleeding: Secondary | ICD-10-CM

## 2015-11-28 DIAGNOSIS — E039 Hypothyroidism, unspecified: Secondary | ICD-10-CM

## 2015-11-28 MED ORDER — DIGOXIN 125 MCG PO TABS: 125.0000 ug | ORAL_TABLET | ORAL | 5 refills | Status: DC

## 2015-11-28 MED ORDER — BENAZEPRIL HCL 5 MG PO TABS: 5.0000 mg | ORAL_TABLET | Freq: Every day | ORAL | 5 refills | Status: DC

## 2015-11-28 MED ORDER — ESOMEPRAZOLE MAGNESIUM 40 MG PO CPDR: 40.0000 mg | DELAYED_RELEASE_CAPSULE | Freq: Every day | ORAL | 1 refills | Status: DC

## 2015-11-28 NOTE — Progress Notes (Signed)
Subjective:    Patient ID: Jeffrey Mccarty, male    DOB: 1921-08-21, 82 y.o.   MRN: [id number]  Hypertension  This is a chronic problem. The current episode started more than 1 year ago. The problem has been gradually improving since onset. There are no associated agents to hypertension. There are no known risk factors for coronary artery disease. Treatments tried: benazapril. The current treatment provides moderate improvement. There are no compliance problems.    Daughter states that patient gets disoriented at times.   Results for orders placed or performed in visit on 11/10/15  TSH  Result Value Ref Range   TSH 3.44 0.40 - 4.50 mIU/L  T4, free  Result Value Ref Range   Free T4 1.3 0.8 - 1.8 ng/dL   Recent Results (from the past 2160 hour(s))  TSH     Status: None   Collection Time: 11/10/15 10:42 AM  Result Value Ref Range   TSH 3.44 0.40 - 4.50 mIU/L  T4, free     Status: None   Collection Time: 11/10/15 10:42 AM  Result Value Ref Range   Free T4 1.3 0.8 - 1.8 ng/dL  Basic metabolic panel     Status: Abnormal   Collection Time: 11/21/15 10:04 AM  Result Value Ref Range   Glucose 91 65 - 99 mg/dL   BUN 27 10 - 36 mg/dL   Creatinine, Ser 4.091.29 (H) 0.76 - 1.27 mg/dL   GFR calc non Af Amer 48 (L) >59 mL/min/1.73   GFR calc Af Amer 56 (L) >59 mL/min/1.73   BUN/Creatinine Ratio 21 10 - 24   Sodium 143 134 - 144 mmol/L   Potassium 4.6 3.5 - 5.2 mmol/L   Chloride 102 96 - 106 mmol/L   CO2 24 18 - 29 mmol/L   Calcium 9.9 8.6 - 10.2 mg/dL  Lipid panel     Status: Abnormal   Collection Time: 11/21/15 10:04 AM  Result Value Ref Range   Cholesterol, Total 207 (H) 100 - 199 mg/dL   Triglycerides 70 0 - 149 mg/dL   HDL 66 >81>39 mg/dL   VLDL Cholesterol Cal 14 5 - 40 mg/dL   LDL Calculated 191127 (H) 0 - 99 mg/dL   Chol/HDL Ratio 3.1 0.0 - 5.0 ratio units    Comment:                                   T. Chol/HDL Ratio                                             Men  Women                      1/2 Avg.Risk  3.4    3.3                                   Avg.Risk  5.0    4.4                                2X Avg.Risk  9.6    7.1  3X Avg.Risk 23.4   11.0   Hepatic function panel     Status: None   Collection Time: 11/21/15 10:04 AM  Result Value Ref Range   Total Protein 7.3 6.0 - 8.5 g/dL   Albumin 4.3 3.2 - 4.6 g/dL   Bilirubin Total 0.4 0.0 - 1.2 mg/dL   Bilirubin, Direct 1.610.12 0.00 - 0.40 mg/dL   Alkaline Phosphatase 41 39 - 117 IU/L   AST 21 0 - 40 IU/L   ALT 11 0 - 44 IU/L  TSH     Status: None   Collection Time: 11/21/15 10:04 AM  Result Value Ref Range   TSH 3.370 0.450 - 4.500 uIU/mL  Digoxin level     Status: None   Collection Time: 11/21/15 10:04 AM  Result Value Ref Range   Digoxin, Serum 0.5 0.5 - 0.9 ng/mL    Comment: Concentrations above 2.0 ng/mL are generally considered toxic. Some overlap of toxic and non-toxic values have been reported.                             Detection Limit = 0.4 ng/mL Therapeutic range is derived from 2013 ACCF/AHA Guidelines for the Management of Heart Failure.    Blood pressure medicine and blood pressure levels reviewed today with patient. Compliant with blood pressure medicine. States does not miss a dose. No obvious side effects. Blood pressure generally good when checked elsewhere. Watching salt intake.  Patient has long-standing history of reflux. Compliant with medications. States deftly helps. Would like to stay with it.  Compliant with thyroid medicine. No obvious side effects. Watching does closely.  On meds for atrial fibrillation. Compliant. Tries not to miss a dose. No prominent chest pain or excess shortness of breath Review of Systems No headache, no major weight loss or weight gain, no chest pain no back pain abdominal pain no change in bowel habits complete ROS otherwise negative     Objective:   Physical Exam  Alert vitals stable, NAD. Blood pressure good  on repeat. HEENT normal. Lungs clear. Heart regular rate and rhythm. No significant edema      Assessment & Plan:  Impression 1 hypertension good control discussed maintain same meds #2 chronic A. fib currently good control of rate tolerating meds well to maintain same dig level good last check. Also maintain L request. Rationale discussed with patient. #3 hard of hearing ongoing challenge. Patient reluctant to go back and see a cardiologist. #4 hypothyroidism plan all medications refilled. Diet exercise discussed recheck in 6 months [facility]

## 2015-12-06 ENCOUNTER — Ambulatory Visit: Payer: Commercial Managed Care - HMO | Admitting: Urology

## 2015-12-25 ENCOUNTER — Other Ambulatory Visit: Payer: Self-pay | Admitting: "Endocrinology

## 2015-12-26 ENCOUNTER — Other Ambulatory Visit: Payer: Self-pay

## 2015-12-26 MED ORDER — LEVOTHYROXINE SODIUM 75 MCG PO TABS: 75.0000 ug | ORAL_TABLET | Freq: Every day | ORAL | 6 refills | Status: DC

## 2016-01-17 DIAGNOSIS — R972 Elevated prostate specific antigen [PSA]: Secondary | ICD-10-CM | POA: Diagnosis not present

## 2016-01-24 ENCOUNTER — Ambulatory Visit (INDEPENDENT_AMBULATORY_CARE_PROVIDER_SITE_OTHER): Payer: Commercial Managed Care - HMO | Admitting: Urology

## 2016-01-24 DIAGNOSIS — R310 Gross hematuria: Secondary | ICD-10-CM

## 2016-01-24 DIAGNOSIS — R972 Elevated prostate specific antigen [PSA]: Secondary | ICD-10-CM | POA: Diagnosis not present

## 2016-05-08 ENCOUNTER — Ambulatory Visit (INDEPENDENT_AMBULATORY_CARE_PROVIDER_SITE_OTHER): Payer: Medicare HMO | Admitting: Cardiology

## 2016-05-08 ENCOUNTER — Other Ambulatory Visit: Payer: Self-pay | Admitting: "Endocrinology

## 2016-05-08 ENCOUNTER — Encounter: Payer: Self-pay | Admitting: Cardiology

## 2016-05-08 VITALS — BP 136/64 | HR 64 | Ht 60.0 in | Wt 110.0 lb

## 2016-05-08 DIAGNOSIS — I519 Heart disease, unspecified: Secondary | ICD-10-CM | POA: Diagnosis not present

## 2016-05-08 DIAGNOSIS — E890 Postprocedural hypothyroidism: Secondary | ICD-10-CM | POA: Diagnosis not present

## 2016-05-08 DIAGNOSIS — I5189 Other ill-defined heart diseases: Secondary | ICD-10-CM

## 2016-05-08 DIAGNOSIS — I10 Essential (primary) hypertension: Secondary | ICD-10-CM

## 2016-05-08 DIAGNOSIS — I350 Nonrheumatic aortic (valve) stenosis: Secondary | ICD-10-CM | POA: Diagnosis not present

## 2016-05-08 DIAGNOSIS — I4891 Unspecified atrial fibrillation: Secondary | ICD-10-CM | POA: Diagnosis not present

## 2016-05-08 LAB — TSH: TSH: 3.94 mIU/L (ref 0.40–4.50)

## 2016-05-08 LAB — T4, FREE: Free T4: 1.2 ng/dL (ref 0.8–1.8)

## 2016-05-08 NOTE — Progress Notes (Signed)
Clinical Summary Jeffrey Mccarty is a 83 y.o.male seen today for follow up of the following medical problems.   1. Afib  - blood pressure has had difficulty tolerating beta blockers or CCBs during prior admission, he has actually been only on dig 0.125 mg every other day and doing well for quite some time - CHADS2 score of 3, he is on eliquis - he remains active. He enjoys [place]the barn" to listen to country music.   - no recent palpitatinos  2. HTN  - compliant with meds   3. Mild to moderate aortic stenosis  - 04/2015 echo moderate AS. AVA VTI 1.14, mean grad 25 - poorly visuazlied on most recent echo, mean gradient of 21 mmHg suggestive of moderate but technically limited.   - no SOB, no chest pain, no dizziness.   4. Diastolic dysfunction  - grade 2 on most recent echo  - no recent SOB/DOE/LE edema  He reports upcoming labs with pcp   Past Medical History:  Diagnosis Date  . Atrial fibrillation (HCC)   . Elevated PSA   . Hypertension   . Hypothyroid   . Insomnia   . Kidney stone   . Polymyalgia rheumatica (HCC)   . Pressure ulcer   . Reflux      Allergies  Allergen Reactions  . Cardura [Doxazosin Mesylate]      Current Outpatient Prescriptions  Medication Sig Dispense Refill  . benazepril (LOTENSIN) 5 MG tablet Take 1 tablet (5 mg total) by mouth daily. 30 tablet 5  . digoxin (DIGITEK) 0.125 MG tablet Take 1 tablet (125 mcg total) by mouth every other day. 15 tablet 5  . ELIQUIS 2.5 MG TABS tablet take 1 tablet by mouth twice a day 90 tablet 3  . esomeprazole (NEXIUM) 40 MG capsule Take 1 capsule (40 mg total) by mouth daily. 90 capsule 1  . feeding supplement, ENSURE COMPLETE, (ENSURE COMPLETE) LIQD Take 237 mLs by mouth 2 (two) times daily between meals. 30 Bottle 1  . hydrochlorothiazide (MICROZIDE) 12.5 MG capsule take 1 capsule by mouth once daily 30 capsule 5  . levothyroxine (SYNTHROID, LEVOTHROID) 75 MCG tablet Take 1 tablet (75  mcg total) by mouth daily before breakfast. 30 tablet 6  . triamcinolone ointment (KENALOG) 0.1 % apply to affected area twice a day 60 g 3   No current facility-administered medications for this visit.      Past Surgical History:  Procedure Laterality Date  . APPENDECTOMY    . CHOLECYSTECTOMY    . THYROIDECTOMY    . TONSILLECTOMY       Allergies  Allergen Reactions  . Cardura [Doxazosin Mesylate]       Family History  Problem Relation Age of Onset  . Heart attack Father      Social History Jeffrey Mccarty reports that he has never smoked. He has never used smokeless tobacco. Jeffrey Mccarty reports that he does not drink alcohol.   Review of Systems CONSTITUTIONAL: No weight loss, fever, chills, weakness or fatigue.  HEENT: Eyes: No visual loss, blurred vision, double vision or yellow sclerae.No hearing loss, sneezing, congestion, runny nose or sore throat.  SKIN: No rash or itching.  CARDIOVASCULAR: per hpi RESPIRATORY: No shortness of breath, cough or sputum.  GASTROINTESTINAL: No anorexia, nausea, vomiting or diarrhea. No abdominal pain or blood.  GENITOURINARY: No burning on urination, no polyuria NEUROLOGICAL: No headache, dizziness, syncope, paralysis, ataxia, numbness or tingling in the extremities. No change in bowel  or bladder control.  MUSCULOSKELETAL: No muscle, back pain, joint pain or stiffness.  LYMPHATICS: No enlarged nodes. No history of splenectomy.  PSYCHIATRIC: No history of depression or anxiety.  ENDOCRINOLOGIC: No reports of sweating, cold or heat intolerance. No polyuria or polydipsia.  Marland Kitchen   Physical Examination Vitals:   05/08/16 1140  BP: 136/64  Pulse: 64   Vitals:   05/08/16 1140  Weight: 110 lb (49.9 kg)  Height: 5' (1.524 m)    Gen: resting comfortably, no acute distress HEENT: no scleral icterus, pupils equal round and reactive, no palptable cervical adenopathy,  CV: RRR, 2/6 systolic murmur rusb, no jvd Resp: Clear to  auscultation bilaterally GI: abdomen is soft, non-tender, non-distended, normal bowel sounds, no hepatosplenomegaly MSK: extremities are warm, no edema.  Skin: warm, no rash Neuro:  no focal deficits Psych: appropriate affect   Diagnostic Studies 01/2013 Echo LVEF 55-60%, akinesis basalinferolateral myocardium, grade II diastolic dysfunction,    04/2015 echo Study Conclusions  - Left ventricle: The cavity size was normal. There was focal basal hypertrophy. Systolic function was normal. The estimated ejection fraction was in the range of 55% to 60%. Doppler parameters are consistent with abnormal left ventricular relaxation (grade 1 diastolic dysfunction). - Aortic valve: Moderately calcified annulus. Trileaflet; moderately thickened leaflets. There was moderate stenosis. There was mild to moderate regurgitation. Valve area (VTI): 1.14 cm^2. Valve area (Vmax): 1.18 cm^2. Valve area (Vmean): 0.88 cm^2. - Mitral valve: There was mild to moderate regurgitation. - Left atrium: The atrium was severely dilated. - Right atrium: The atrium was moderately dilated. - Technically adequate study.    Assessment and Plan  1. Afib  - no recent palpitations. EKG in clinic today shows rate controlled afib - He has been managed on every other day digoxin due to soft bp's on beta blockers and CCBs.  - CHADS2Vasc score is 3, he will continue anticoagulation  2. HTN  - bp is at goal,  continue current meds  3. Aortic stenosis  - no recent symptoms, we will continue to monitor at this time.   4. Diastolic dysfunction  - no symptoms, continue current meds      F/u 6 months      Antoine Poche, M.D.

## 2016-05-08 NOTE — Patient Instructions (Signed)

## 2016-05-14 ENCOUNTER — Other Ambulatory Visit: Payer: Self-pay | Admitting: Cardiology

## 2016-05-17 ENCOUNTER — Ambulatory Visit (INDEPENDENT_AMBULATORY_CARE_PROVIDER_SITE_OTHER): Payer: Medicare HMO | Admitting: "Endocrinology

## 2016-05-17 ENCOUNTER — Encounter: Payer: Self-pay | Admitting: "Endocrinology

## 2016-05-17 VITALS — BP 123/62 | HR 70 | Ht 61.0 in | Wt 111.0 lb

## 2016-05-17 DIAGNOSIS — E890 Postprocedural hypothyroidism: Secondary | ICD-10-CM | POA: Diagnosis not present

## 2016-05-17 MED ORDER — LEVOTHYROXINE SODIUM 75 MCG PO TABS: 75.0000 ug | ORAL_TABLET | Freq: Every day | ORAL | 12 refills | Status: AC

## 2016-05-17 NOTE — Progress Notes (Signed)
HPI  Jeffrey Mccarty is a 83 y.o.-year-old male here to f/u for his RAI induced hypothyroidism.   83 yr old male with hx of thyrotoxicosis. he is s/p i131 therapy on Jun 19, 2013.  he is here for f/u of RAI hypothyroidism. he is on levothyroxine 75 g by mouth every morning. He continues to feel better..  No new complaint. He has steady weight. he denies palpitations, nor heat intolerance. Since his last visit he was initiated on Eliquis, related to his diagnosis of atrial fibrillation. His hx is significant for remote past hx of partial thyroidectomy 40 years ago for reported "thyroid storm" and recent hx of a-fib. No hx of thyroid cancer in family. denies any exposure to neck radiation.  I reviewed pt's thyroid tests: Lab Results  Component Value Date   TSH 3.94 05/08/2016   TSH 3.370 11/21/2015   TSH 3.44 11/10/2015   TSH 6.250 (H) 05/11/2015   TSH 4.320 11/08/2014   TSH 3.150 05/20/2014   TSH 0.017 (L) 01/31/2013   FREET4 1.2 05/08/2016   FREET4 1.3 11/10/2015   FREET4 1.35 05/11/2015   FREET4 1.42 11/08/2014   FREET4 1.45 01/31/2013     Pt denies cold intolerance, constipation, weight change.   ROS: Constitutional: +weight gain, no fatigue, no subjective hyperthermia/hypothermia Eyes: no blurry vision, no xerophthalmia ENT: no sore throat, no nodules palpated in throat, no dysphagia/odynophagia, no hoarseness Cardiovascular: no CP/SOB/palpitations/leg swelling Respiratory: no cough/SOB Gastrointestinal: no N/V/D/C Musculoskeletal: no muscle/joint aches Skin: no rashes Neurological: no tremors/numbness/tingling/dizziness Psychiatric: no depression/anxiety  PE: BP 123/62   Pulse 70   Ht  (1.549 m)   Wt 111 lb (50.3 kg)   BMI 20.97 kg/m  Wt Readings from Last 3 Encounters:  05/17/16 111 lb (50.3 kg)  05/08/16 110 lb (49.9 kg)  11/28/15 110 lb 8 oz (50.1 kg)   Constitutional:An elderly month, Hard of hearing, in no acute distress. HEENT: PERRLA, EOMI, no  exophthalmos ENT: moist mucous membranes, no thyromegaly, no cervical lymphadenopathy Cardiovascular: RRR, No MRG Respiratory: CTA B Gastrointestinal: abdomen soft, NT, ND, BS+ Musculoskeletal: no deformities, strength intact in all 4 Skin: moist, warm, no rashes Neurological: no tremor with outstretched hands, DTR normal in all 4  Recent Results (from the past 2160 hour(s))  TSH     Status: None   Collection Time: 05/08/16 12:50 PM  Result Value Ref Range   TSH 3.94 0.40 - 4.50 mIU/L  T4, free     Status: None   Collection Time: 05/08/16 12:50 PM  Result Value Ref Range   Free T4 1.2 0.8 - 1.8 ng/dL   ASSESSMENT: 1.  RAI induced Hypothyroidism - His thyroid function tests are consistent with appropriate replacement. I will continue levothyroxine 75 g by mouth every morning. - We discussed about correct intake of levothyroxine, at fasting, with water, separated by at least 30 minutes from breakfast, and separated by more than 4 hours from calcium, iron, multivitamins, acid reflux medications (PPIs). -Patient is made aware of the fact that thyroid hormone replacement is needed for life, dose to be adjusted by periodic monitoring of thyroid function tests.  Marquis Lunch, MD Phone: [phone]  Fax: [phone]   05/17/2016, 10:30 AM

## 2016-05-28 ENCOUNTER — Ambulatory Visit (INDEPENDENT_AMBULATORY_CARE_PROVIDER_SITE_OTHER): Payer: Medicare HMO | Admitting: Family Medicine

## 2016-05-28 ENCOUNTER — Encounter: Payer: Self-pay | Admitting: Family Medicine

## 2016-05-28 VITALS — BP 132/86 | Ht 61.0 in | Wt 112.0 lb

## 2016-05-28 DIAGNOSIS — K219 Gastro-esophageal reflux disease without esophagitis: Secondary | ICD-10-CM

## 2016-05-28 DIAGNOSIS — I10 Essential (primary) hypertension: Secondary | ICD-10-CM

## 2016-05-28 DIAGNOSIS — E890 Postprocedural hypothyroidism: Secondary | ICD-10-CM | POA: Diagnosis not present

## 2016-05-28 DIAGNOSIS — J301 Allergic rhinitis due to pollen: Secondary | ICD-10-CM

## 2016-05-28 MED ORDER — DIGOXIN 125 MCG PO TABS: 125.0000 ug | ORAL_TABLET | ORAL | 5 refills | Status: DC

## 2016-05-28 MED ORDER — ESOMEPRAZOLE MAGNESIUM 40 MG PO CPDR: 40.0000 mg | DELAYED_RELEASE_CAPSULE | Freq: Every day | ORAL | 1 refills | Status: DC

## 2016-05-28 MED ORDER — HYDROCHLOROTHIAZIDE 12.5 MG PO CAPS: 12.5000 mg | ORAL_CAPSULE | Freq: Every day | ORAL | 5 refills | Status: DC

## 2016-05-28 MED ORDER — BENAZEPRIL HCL 5 MG PO TABS: 5.0000 mg | ORAL_TABLET | Freq: Every day | ORAL | 5 refills | Status: DC

## 2016-05-28 NOTE — Progress Notes (Signed)
   Subjective:    Patient ID: Jeffrey Mccarty, male    DOB: 1921/03/23, 83 y.o.   MRN: [id number]  Hypertension  This is a chronic problem. The current episode started more than 1 year ago. Risk factors for coronary artery disease include sedentary lifestyle and male gender. Treatments tried: lotensin. There are no compliance problems.     Results for orders placed or performed in visit on 05/08/16  TSH  Result Value Ref Range   TSH 3.94 0.40 - 4.50 mIU/L  T4, free  Result Value Ref Range   Free T4 1.2 0.8 - 1.8 ng/dL   Blood pressure medicine and blood pressure levels reviewed today with patient. Compliant with blood pressure medicine. States does not miss a dose. No obvious side effects. Blood pressure generally good when checked elsewhere. Watching salt intake.  Patient continues to take lipid medication regularly. No obvious side effects from it. Generally does not miss a dose. Prior blood work results are reviewed with patient. Patient continues to work on fat intake in diet  Patient states compliant with medications for reflux. No obvious side effects from meds.  Patient once again once to discuss his thyroid though really his thyroid is managed by the endocrinologist. Compliant with medications. We reviewed his blood work within good range. Dr. Fransico Him appropriately maintain thyroid same dose  Review of Systems No headache, no major weight loss or weight gain, no chest pain no back pain abdominal pain no change in bowel habits complete ROS otherwise negative     Objective:   Physical Exam Alert and oriented, vitals reviewed and stable, NAD ENT-TM's and ext canals WNL bilat via otoscopic exam Soft palate, tonsils and post pharynx WNL via oropharyngeal exam Neck-symmetric, no masses; thyroid nonpalpable and nontender Pulmonary-no tachypnea or accessory muscle use; Clear without wheezes via auscultation Card--no abnrml murmurs, rhythm reg and rate WNL Carotid pulses symmetric,  without bruits        Assessment & Plan:  Impression 1 hypertension good control discussed maintain same approach #2 hyperlipidemia. Prior blood work reviewed. No need to repeat same #3 reflux clinically stable patient definitely needs he's meds without it develops serious symptoms #4 hypothyroidism discussed again to maintain same dose activity level discussed. Compliance discussed. Multiple questions answered  Greater than 50% of this 25 minute face to face visit was spent in counseling and discussion and coordination of care regarding the above diagnosis/diagnosies

## 2016-08-28 ENCOUNTER — Telehealth: Payer: Self-pay | Admitting: Family Medicine

## 2016-08-28 ENCOUNTER — Other Ambulatory Visit: Payer: Self-pay | Admitting: Nurse Practitioner

## 2016-08-28 MED ORDER — PREDNISONE 10 MG PO TABS: ORAL_TABLET | ORAL | 0 refills | Status: DC

## 2016-08-28 NOTE — Telephone Encounter (Signed)
Patient notified

## 2016-08-28 NOTE — Telephone Encounter (Signed)
Sent in same dose that he had been given previously. Office visit later this week if no improvement.

## 2016-08-28 NOTE — Telephone Encounter (Signed)
Pt has gout in his foot and is wanting to know if something can be called in.    [facility]

## 2016-08-28 NOTE — Telephone Encounter (Signed)
Requests prednisone taper for gout flare

## 2016-09-11 ENCOUNTER — Ambulatory Visit (INDEPENDENT_AMBULATORY_CARE_PROVIDER_SITE_OTHER): Payer: Medicare HMO | Admitting: Family Medicine

## 2016-09-11 ENCOUNTER — Encounter: Payer: Self-pay | Admitting: Family Medicine

## 2016-09-11 VITALS — BP 110/80 | Temp 97.9°F | Ht 61.0 in | Wt 104.0 lb

## 2016-09-11 DIAGNOSIS — M1000 Idiopathic gout, unspecified site: Secondary | ICD-10-CM

## 2016-09-11 DIAGNOSIS — M109 Gout, unspecified: Secondary | ICD-10-CM | POA: Insufficient documentation

## 2016-09-11 DIAGNOSIS — M79671 Pain in right foot: Secondary | ICD-10-CM | POA: Diagnosis not present

## 2016-09-11 MED ORDER — METHYLPREDNISOLONE ACETATE 40 MG/ML IJ SUSP
40.0000 mg | Freq: Once | INTRAMUSCULAR | Status: AC
  Administered 2016-09-11: 40 mg via INTRAMUSCULAR

## 2016-09-11 MED ORDER — COLCHICINE 0.6 MG PO TABS: ORAL_TABLET | ORAL | 0 refills | Status: DC

## 2016-09-11 NOTE — Progress Notes (Signed)
   Subjective:    Patient ID: Marlis EdelsonRoy C Dedic, male    DOB: May 16, 1921, 83 y.o.   MRN: [id number]  Foot Pain  This is a new problem. The current episode started 1 to 4 weeks ago. The problem has been unchanged. Treatments tried: Prednisone   Patient is extremely hard of hearing. Family thinks he has had gout in the past. We called and prednisone at their request earlier in the month. Helped a bit but not a lot. Still substantial pain  Patient states no other concerns this visit.   Review of Systems No headache, no major weight loss or weight gain, no chest pain no back pain abdominal pain no change in bowel habits complete ROS otherwise negative     Objective:   Physical Exam  Alert vitals stable, NAD. Blood pressure good on repeat. HEENT normal. Lungs clear. Heart regular rate and rhythm. Impressive inflammation erythema warmth of first metatarsal phalangeal joint right foot      Assessment & Plan:  Impression probable out plan Solu-Medrol rather Depo-Medrol injection. Cold Chris 2 tablets immediately then twice a day. Local measures discussed if continues to suffer flares will need to consider preventive agent long-term and potentially changing of chronic meds

## 2016-09-24 ENCOUNTER — Emergency Department (HOSPITAL_COMMUNITY): Payer: Medicare HMO

## 2016-09-24 ENCOUNTER — Observation Stay (HOSPITAL_COMMUNITY)
Admission: EM | Admit: 2016-09-24 | Discharge: 2016-09-26 | Disposition: A | Discharge status: Home / Self Care | Payer: Medicare HMO | Attending: Internal Medicine | Admitting: Internal Medicine

## 2016-09-24 ENCOUNTER — Encounter (HOSPITAL_COMMUNITY): Payer: Self-pay | Admitting: Emergency Medicine

## 2016-09-24 DIAGNOSIS — Z79899 Other long term (current) drug therapy: Secondary | ICD-10-CM | POA: Diagnosis not present

## 2016-09-24 DIAGNOSIS — Z7901 Long term (current) use of anticoagulants: Secondary | ICD-10-CM | POA: Insufficient documentation

## 2016-09-24 DIAGNOSIS — I10 Essential (primary) hypertension: Secondary | ICD-10-CM | POA: Diagnosis not present

## 2016-09-24 DIAGNOSIS — I482 Chronic atrial fibrillation: Secondary | ICD-10-CM | POA: Diagnosis not present

## 2016-09-24 DIAGNOSIS — R1111 Vomiting without nausea: Secondary | ICD-10-CM | POA: Diagnosis not present

## 2016-09-24 DIAGNOSIS — E890 Postprocedural hypothyroidism: Secondary | ICD-10-CM | POA: Diagnosis not present

## 2016-09-24 DIAGNOSIS — E876 Hypokalemia: Secondary | ICD-10-CM

## 2016-09-24 DIAGNOSIS — R109 Unspecified abdominal pain: Secondary | ICD-10-CM | POA: Diagnosis not present

## 2016-09-24 DIAGNOSIS — K529 Noninfective gastroenteritis and colitis, unspecified: Principal | ICD-10-CM

## 2016-09-24 DIAGNOSIS — N179 Acute kidney failure, unspecified: Secondary | ICD-10-CM | POA: Diagnosis not present

## 2016-09-24 DIAGNOSIS — E039 Hypothyroidism, unspecified: Secondary | ICD-10-CM | POA: Insufficient documentation

## 2016-09-24 DIAGNOSIS — R112 Nausea with vomiting, unspecified: Secondary | ICD-10-CM | POA: Diagnosis not present

## 2016-09-24 LAB — CBC
HEMATOCRIT: 41.7 % (ref 39.0–52.0)
Hemoglobin: 14.9 g/dL (ref 13.0–17.0)
MCH: 31.6 pg (ref 26.0–34.0)
MCHC: 35.7 g/dL (ref 30.0–36.0)
MCV: 88.5 fL (ref 78.0–100.0)
Platelets: 308 10*3/uL (ref 150–400)
RBC: 4.71 MIL/uL (ref 4.22–5.81)
RDW: 14.5 % (ref 11.5–15.5)
WBC: 6.3 10*3/uL (ref 4.0–10.5)

## 2016-09-24 LAB — COMPREHENSIVE METABOLIC PANEL
ALBUMIN: 4.1 g/dL (ref 3.5–5.0)
ALK PHOS: 63 U/L (ref 38–126)
ALT: 31 U/L (ref 17–63)
AST: 45 U/L — AB (ref 15–41)
Anion gap: 13 (ref 5–15)
BILIRUBIN TOTAL: 0.8 mg/dL (ref 0.3–1.2)
BUN: 44 mg/dL — AB (ref 6–20)
CALCIUM: 8.1 mg/dL — AB (ref 8.9–10.3)
CO2: 22 mmol/L (ref 22–32)
Chloride: 101 mmol/L (ref 101–111)
Creatinine, Ser: 2.02 mg/dL — ABNORMAL HIGH (ref 0.61–1.24)
GFR calc Af Amer: 31 mL/min — ABNORMAL LOW (ref >60)
GFR calc non Af Amer: 27 mL/min — ABNORMAL LOW (ref >60)
GLUCOSE: 92 mg/dL (ref 65–99)
Potassium: 2.9 mmol/L — ABNORMAL LOW (ref 3.5–5.1)
SODIUM: 136 mmol/L (ref 135–145)
TOTAL PROTEIN: 7.4 g/dL (ref 6.5–8.1)

## 2016-09-24 LAB — DIGOXIN LEVEL: DIGOXIN LVL: 0.5 ng/mL — AB (ref 0.8–2.0)

## 2016-09-24 LAB — LIPASE, BLOOD: Lipase: 76 U/L — ABNORMAL HIGH (ref 11–51)

## 2016-09-24 MED ORDER — POTASSIUM CHLORIDE 10 MEQ/100ML IV SOLN
10.0000 meq | Freq: Once | INTRAVENOUS | Status: AC
  Administered 2016-09-24: 10 meq via INTRAVENOUS
  Filled 2016-09-24: qty 100

## 2016-09-24 MED ORDER — SODIUM CHLORIDE 0.9 % IV BOLUS (SEPSIS)
500.0000 mL | Freq: Once | INTRAVENOUS | Status: AC
  Administered 2016-09-24: 500 mL via INTRAVENOUS

## 2016-09-24 MED ORDER — SODIUM CHLORIDE 0.9 % IV BOLUS (SEPSIS)
1000.0000 mL | Freq: Once | INTRAVENOUS | Status: AC
  Administered 2016-09-24: 1000 mL via INTRAVENOUS

## 2016-09-24 MED ORDER — MORPHINE SULFATE (PF) 4 MG/ML IV SOLN
4.0000 mg | Freq: Once | INTRAVENOUS | Status: AC
  Administered 2016-09-24: 4 mg via INTRAVENOUS
  Filled 2016-09-24: qty 1

## 2016-09-24 NOTE — ED Provider Notes (Signed)
[facility]EMER[facility]ENCY DEPT Provider Note   CSN: [id number] Arrival date & time: 09/24/16  1638     History   Chief Complaint Chief Complaint  Patient presents with  . Abdominal Pain  . Nausea  . Emesis    HPI Jeffrey Mccarty is a 83 y.o. male.  HPI   Jeffrey Mccarty is a 83yo male with a history of afib (on chronic warfarin tx), hypertension, hypothyroidism, gout who presents to the Emergency Department for abdominal pain, nausea and vomiting which started yesterday. Of note, he is very hard of hearing and a difficult historian. His daughter at the bedside provides most details of the history. She states that her father called her this afternoon because he had 9/10 abdominal pain. When asked, Jeffrey Mccarty points to his epigastrium as the location of his pain. Daughter states that he had watery diarrhea today, nausea without vomiting today. She states that he lives alone, and takes care of himself for the most part. She denies cancer history, recent travel or recent antibiotics. She states that he recently had a gout flare and is on colchicine. He is not on a steroid, although he was a few weeks ago. She states that he is a non-smoker and has no history of abdominal surgery. She states that she doesn't think that he has eaten recently.   Past Medical History:  Diagnosis Date  . Atrial fibrillation (HCC)   . Elevated PSA   . Hypertension   . Hypothyroid   . Insomnia   . Kidney stone   . Polymyalgia rheumatica (HCC)   . Pressure ulcer   . Reflux     Patient Active Problem List   Diagnosis Date Noted  . Gout 09/11/2016  . Hypothyroidism following radioiodine therapy 11/12/2014  . Allergic rhinitis 05/24/2013  . Malnutrition of moderate degree (HCC) 02/03/2013  . Esophageal reflux 02/01/2013  . Rash and nonspecific skin eruption 02/01/2013  . Atrial fibrillation with RVR (HCC) 01/31/2013  . URTI (acute upper respiratory infection) 01/31/2013  . ARF (acute renal failure) (HCC) 01/31/2013    . Essential hypertension 10/05/2008  . BILIARY DYSKINESIA 10/05/2008    Past Surgical History:  Procedure Laterality Date  . APPENDECTOMY    . CHOLECYSTECTOMY    . THYROIDECTOMY    . TONSILLECTOMY         Home Medications    Prior to Admission medications   Medication Sig Start Date End Date Taking? Authorizing Provider  benazepril (LOTENSIN) 5 MG tablet Take 1 tablet (5 mg total) by mouth daily. 05/28/16  Yes [name], MD  colchicine 0.6 MG tablet Take 2 tablets by mouth 1st dose, then take 1 tablet by mouth twice day Patient taking differently: Take 0.6 mg by mouth See admin instructions. Take 2 tablets by mouth 1st dose, then take 1 tablet by mouth twice day 09/11/16  Yes [name], MD  digoxin (DIGITEK) 0.125 MG tablet Take 1 tablet (125 mcg total) by mouth every other day. 05/28/16  Yes [name], MD  ELIQUIS 2.5 MG TABS tablet take 1 tablet by mouth twice a day 05/14/16  Yes Branch, Dorothe Pea, MD  esomeprazole (NEXIUM) 40 MG capsule Take 1 capsule (40 mg total) by mouth daily. 05/28/16  Yes [name], MD  feeding supplement, ENSURE COMPLETE, (ENSURE COMPLETE) LIQD Take 237 mLs by mouth 2 (two) times daily between meals. 02/03/13  Yes Leroy Sea, MD  hydrochlorothiazide (MICROZIDE) 12.5 MG capsule Take 1 capsule (12.5 mg total)  by mouth daily. 05/28/16  Yes [name], MD  levothyroxine (SYNTHROID, LEVOTHROID) 75 MCG tablet Take 1 tablet (75 mcg total) by mouth daily before breakfast. 05/17/16  Yes Nida, Denman George, MD  predniSONE (DELTASONE) 10 MG tablet 4 tabs po qd x 3d; then 3 po qd x 3 d; then 2 po qd x 2d Patient not taking: Reported on 09/11/2016 08/28/16   Campbell Riches, NP  triamcinolone ointment (KENALOG) 0.1 % apply to affected area twice a day Patient taking differently: Apply 1 application topically 2 (two) times daily.  11/08/14   [name], MD    Family History Family History  Problem Relation Age of  Onset  . Heart attack Father     Social History Social History  Substance Use Topics  . Smoking status: Never Smoker  . Smokeless tobacco: Never Used  . Alcohol use No     Allergies   Cardura [doxazosin mesylate]   Review of Systems Review of Systems  Unable to perform ROS: Other   Patient is so hard of hearing, that he cannot answer most questions.    Physical Exam Updated Vital Signs BP 104/84   Pulse 76   Temp 97.6 F (36.4 C) (Oral)   Resp 17   Ht 5\' 1"  (1.549 m)   Wt 47.2 kg (104 lb)   SpO2 93%   BMI 19.65 kg/m   Physical Exam  Constitutional: He appears well-developed.  Elderly, frail and thin.    HENT:  Head: Normocephalic and atraumatic.  Eyes: Pupils are equal, round, and reactive to light. Right eye exhibits no discharge. Left eye exhibits no discharge.  Cardiovascular: Normal rate and regular rhythm.  Exam reveals no gallop and no friction rub.   Murmur (Grade 3/6 systolic blowing murmur heard best at the right upper sternal border and radiates to the carotids) heard. Pulmonary/Chest: Effort normal and breath sounds normal. No respiratory distress. He has no wheezes. He has no rales.  Abdominal: Soft. Bowel sounds are normal.  Abdomen is non-distended. Bowel sounds present. Patient is acutely tender to palpation over the epigastrium with rigidity and guarding.   Musculoskeletal: Normal range of motion.  Neurological: He is alert. He displays normal reflexes. Coordination normal.  Skin: Skin is warm and dry. Capillary refill takes less than 2 seconds.  Psychiatric: He has a normal mood and affect.  Nursing note and vitals reviewed.    ED Treatments / Results  Labs (all labs ordered are listed, but only abnormal results are displayed) Labs Reviewed  LIPASE, BLOOD - Abnormal; Notable for the following:       Result Value   Lipase 76 (*)    All other components within normal limits  COMPREHENSIVE METABOLIC PANEL - Abnormal; Notable for the  following:    Potassium 2.9 (*)    BUN 44 (*)    Creatinine, Ser 2.02 (*)    Calcium 8.1 (*)    AST 45 (*)    GFR calc non Af Amer 27 (*)    GFR calc Af Amer 31 (*)    All other components within normal limits  DIGOXIN LEVEL - Abnormal; Notable for the following:    Digoxin Level 0.5 (*)    All other components within normal limits  CBC  URINALYSIS, ROUTINE W REFLEX MICROSCOPIC    EKG  EKG Interpretation None       Radiology Ct Abdomen Pelvis Wo Contrast  Result Date: 09/24/2016 CLINICAL DATA:  Abdominal pain nausea vomiting  and diarrhea EXAM: CT ABDOMEN AND PELVIS WITHOUT CONTRAST TECHNIQUE: Multidetector CT imaging of the abdomen and pelvis was performed following the standard protocol without IV contrast. COMPARISON:  01/31/2013, 01/31/2013 FINDINGS: Lower chest: Hazy atelectasis within the posterior lung bases. No consolidation or pleural effusion. Coronary artery calcifications. Small hiatal hernia. Hepatobiliary: No focal liver abnormality is seen. Status post cholecystectomy. Stable prominent extrahepatic bile duct. Pancreas: Unremarkable. No pancreatic ductal dilatation or surrounding inflammatory changes. Spleen: Normal in size without focal abnormality. Adrenals/Urinary Tract: Stable 15 mm low-density mass in the right adrenal gland compatible with adenoma. Left adrenal gland is within normal limits. Low-density lesions within the kidneys, likely representing cysts. Negative for hydronephrosis. Multiple bladder diverticula. Stomach/Bowel: Stomach is nonenlarged. No dilated small bowel. Suspected mild wall thickening of the descending colon and sigmoid colon with slight haziness in the surrounding fat. Appendix not well identified but no right lower quadrant inflammatory process. Vascular/Lymphatic: Aortic atherosclerosis. Stable 13 mm calcified aneurysm in the left upper abdomen. No significantly enlarged lymph nodes. Reproductive: Significantly enlarged prostate gland with coarse  calcification Other: Negative for free air or free fluid. Small fluid or tissue density in the region of the umbilicus. Musculoskeletal: Degenerative changes. Stable marked compression deformity of L1. IMPRESSION: 1. Negative for bowel obstruction 2. Suspect mild bowel wall thickening/colitis of the descending and sigmoid colon. This may be secondary to infection, inflammation, or less likely ischemia. 3. Stable 15 mm right adrenal gland adenoma 4. Markedly enlarged prostate gland as before 5. Stable marked compression fracture of L1 Electronically Signed   By: Jasmine Pang M.D.   On: 09/24/2016 21:02   Dg Chest 1 View  Result Date: 09/24/2016 CLINICAL DATA:  Abdominal pain, atrial fibrillation, hypertension EXAM: CHEST 1 VIEW COMPARISON:  01/31/2013 FINDINGS: Enlargement of cardiac silhouette. Atherosclerotic calcification and elongation of thoracic aorta. Mediastinal contours and pulmonary vascularity otherwise normal. Interstitial infiltrates in the mid to lower lungs bilaterally favor pulmonary edema over infection. No pleural effusion or pneumothorax. Bones demineralized. IMPRESSION: Enlargement of cardiac silhouette. Interstitial infiltrates at the mid to lower lungs favoring pulmonary edema. Electronically Signed   By: Ulyses Southward M.D.   On: 09/24/2016 20:00    Procedures Procedures (including critical care time)  Medications Ordered in ED Medications  sodium chloride 0.9 % bolus 1,000 mL (0 mLs Intravenous Stopped 09/24/16 2100)  morphine 4 MG/ML injection 4 mg (4 mg Intravenous Given 09/24/16 1824)  potassium chloride 10 mEq in 100 mL IVPB (0 mEq Intravenous Stopped 09/24/16 2013)  sodium chloride 0.9 % bolus 500 mL (500 mLs Intravenous New Bag/Given 09/24/16 2109)     Initial Impression / Assessment and Plan / ED Course  I have reviewed the triage vital signs and the nursing notes.  Pertinent labs & imaging results that were available during my care of the patient were reviewed by me and  considered in my medical decision making (see chart for details).  Clinical Course as of Sep 25 2347  Mon Sep 24, 2016  1844 Digoxin, Serum: (!) 0.5 [ES]    Clinical Course User Index [ES] Kellie Shropshire, PA-C   Patient has colitis found on CT scan. He is also having an acute kidney injury at this time. His creatinine is 2.02, whereas it was 1.29 10 months ago. This is likely prerenal, related to dehydration and not being able to keep down fluids over the past day (also BUN:Cr ratio is 22.) Furthermore he was hypotensive on presentation with a blood pressure of 91/48. He  was given 1.5L NS for this and his blood pressure is most recently up to 104/84.      CMP revealed potassium of 2.9, he was given IV potassium for this. EKG reveals no ischemic changes or QTc prolongation. Patient has been unable to urinate to collect UA. He was given antiemetics and pain medication for symptom control. His CXR reveals interstitial infiltrates in the lower lung fields, likely pulmonary edema.    Dr. Hyacinth Meeker discussed this patient to hospitalist service who agree to admit patient overnight for colitis and AKI. Patient with daughters at bedside who agree to this plan.   Final Clinical Impressions(s) / ED Diagnoses   Final diagnoses:  Colitis  Acute kidney injury (HCC)  Nausea and vomiting, intractability of vomiting not specified, unspecified vomiting type    New Prescriptions New Prescriptions   No medications on file     Lawrence Marseilles 09/25/16 0009    Eber Hong, MD 10/01/16 [phone]

## 2016-09-24 NOTE — H&P (Signed)
TRH H&P    Patient Demographics:    Jeffrey Mccarty, is a 83 y.o. male  MRN: [id number]  DOB - 12-10-1921  Admit Date - 09/24/2016  Referring MD/NP/PA: Dr. Hyacinth Meeker  Outpatient Primary MD for the patient is Gerda Diss, Vilinda Blanks, MD  Patient coming from: Home  Chief Complaint  Patient presents with  . Abdominal Pain  . Nausea  . Emesis      HPI:    Jeffrey Mccarty  is a 83 y.o. male, With history of chronic atrial fibrillation, on digoxin and Eliquis for anti-coagulation, who was brought to hospital by his daughters who complaints of diarrhea and vomiting of 2 days duration. Patient lives by himself and called his daughter and told him that he was not feeling good and had multiple loose bowel movements today. He also had multiple episodes of vomiting at home. He denies passing out. No chest or shortness of breath. No fever. No dysuria. He does have history of BPH. He denies any blood in vomitus or stool. Patient recently completed course of prednisone for gout.  In the ED, lab work showed acute kidney injury, BUN/creatinine 44/2.02. His previous BUN/creatinine from 10/17 was 27/1.09. CT of the abdomen and pelvis showed mild bowel wall thickening/colitis of the descending and sigmoid colon, this could be due to infection, inflammation or less likely ischemia.    Review of systems:      All other systems reviewed and are negative.   With Past History of the following :    Past Medical History:  Diagnosis Date  . Atrial fibrillation (HCC)   . Elevated PSA   . Hypertension   . Hypothyroid   . Insomnia   . Kidney stone   . Polymyalgia rheumatica (HCC)   . Pressure ulcer   . Reflux       Past Surgical History:  Procedure Laterality Date  . APPENDECTOMY    . CHOLECYSTECTOMY    . THYROIDECTOMY    . TONSILLECTOMY        Social History:      Social History  Substance Use Topics  . Smoking status:  Never Smoker  . Smokeless tobacco: Never Used  . Alcohol use No       Family History :     Family History  Problem Relation Age of Onset  . Heart attack Father       Home Medications:   Prior to Admission medications   Medication Sig Start Date End Date Taking? Authorizing Provider  benazepril (LOTENSIN) 5 MG tablet Take 1 tablet (5 mg total) by mouth daily. 05/28/16  Yes [name], MD  colchicine 0.6 MG tablet Take 2 tablets by mouth 1st dose, then take 1 tablet by mouth twice day Patient taking differently: Take 0.6 mg by mouth See admin instructions. Take 2 tablets by mouth 1st dose, then take 1 tablet by mouth twice day 09/11/16  Yes [name], MD  digoxin (DIGITEK) 0.125 MG tablet Take 1 tablet (125 mcg total) by mouth every other day. 05/28/16  Yes  [name], MD  ELIQUIS 2.5 MG TABS tablet take 1 tablet by mouth twice a day 05/14/16  Yes Branch, Dorothe Pea, MD  esomeprazole (NEXIUM) 40 MG capsule Take 1 capsule (40 mg total) by mouth daily. 05/28/16  Yes [name], MD  feeding supplement, ENSURE COMPLETE, (ENSURE COMPLETE) LIQD Take 237 mLs by mouth 2 (two) times daily between meals. 02/03/13  Yes Leroy Sea, MD  hydrochlorothiazide (MICROZIDE) 12.5 MG capsule Take 1 capsule (12.5 mg total) by mouth daily. 05/28/16  Yes [name], MD  levothyroxine (SYNTHROID, LEVOTHROID) 75 MCG tablet Take 1 tablet (75 mcg total) by mouth daily before breakfast. 05/17/16  Yes Nida, Denman George, MD  predniSONE (DELTASONE) 10 MG tablet 4 tabs po qd x 3d; then 3 po qd x 3 d; then 2 po qd x 2d Patient not taking: Reported on 09/11/2016 08/28/16   Campbell Riches, NP  triamcinolone ointment (KENALOG) 0.1 % apply to affected area twice a day Patient taking differently: Apply 1 application topically 2 (two) times daily.  11/08/14   [name], MD     Allergies:     Allergies  Allergen Reactions  . Cardura [Doxazosin Mesylate]      Physical  Exam:   Vitals  Blood pressure 104/84, pulse 76, temperature 97.6 F (36.4 C), temperature source Oral, resp. rate 17, height 5\' 1"  (1.549 m), weight 47.2 kg (104 lb), SpO2 93 %.  1.  General:   2. Psychiatric:  Intact judgement and  insight, awake alert, oriented x 3.  3. Neurologic: No focal neurological deficits, all cranial nerves intact.Strength 5/5 all 4 extremities, sensation intact all 4 extremities, plantars down going.  4. Eyes :  anicteric sclerae, moist conjunctivae with no lid lag. PERRLA.  5. ENMT:  Oropharynx clear with moist mucous membranes and good dentition  6. Neck:  supple, no cervical lymphadenopathy appriciated, No thyromegaly  7. Respiratory : Normal respiratory effort, good air movement bilaterally,clear to  auscultation bilaterally  8. Cardiovascular : Irregular rhythm, no gallops, rubs or murmurs, no leg edema  9. Gastrointestinal:  Positive bowel sounds, abdomen soft, non-tender to palpation,no hepatosplenomegaly, no rigidity or guarding       10. Skin:  No cyanosis, normal texture and turgor, no rash, lesions or ulcers  11.Musculoskeletal:  Good muscle tone,  joints appear normal , no effusions,  normal range of motion    Data Review:    CBC  Recent Labs Lab 09/24/16 1720  WBC 6.3  HGB 14.9  HCT 41.7  PLT 308  MCV 88.5  MCH 31.6  MCHC 35.7  RDW 14.5   ------------------------------------------------------------------------------------------------------------------  Chemistries   Recent Labs Lab 09/24/16 1720  NA 136  K 2.9*  CL 101  CO2 22  GLUCOSE 92  BUN 44*  CREATININE 2.02*  CALCIUM 8.1*  AST 45*  ALT 31  ALKPHOS 63  BILITOT 0.8   ------------------------------------------------------------------------------------------------------------------  ------------------------------------------------------------------------------------------------------------------ GFR: Estimated Creatinine Clearance: 15.6  mL/min (A) (by C-G formula based on SCr of 2.02 mg/dL (H)). Liver Function Tests:  Recent Labs Lab 09/24/16 1720  AST 45*  ALT 31  ALKPHOS 63  BILITOT 0.8  PROT 7.4  ALBUMIN 4.1    Recent Labs Lab 09/24/16 1720  LIPASE 76*    --------------------------------------------------------------------------------------------------------------- Urine analysis:    Component Value Date/Time   COLORURINE YELLOW 02/09/2013 0336   APPEARANCEUR CLEAR 02/09/2013 0336   LABSPEC 1.010 02/09/2013 0336   PHURINE 7.0 02/09/2013 0336   GLUCOSEU  NEGATIVE 02/09/2013 0336   HGBUR LARGE (A) 02/09/2013 0336   BILIRUBINUR NEGATIVE 02/09/2013 0336   KETONESUR NEGATIVE 02/09/2013 0336   PROTEINUR NEGATIVE 02/09/2013 0336   UROBILINOGEN 0.2 02/09/2013 0336   NITRITE NEGATIVE 02/09/2013 0336   LEUKOCYTESUR NEGATIVE 02/09/2013 0336      Imaging Results:    Ct Abdomen Pelvis Wo Contrast  Result Date: 09/24/2016 CLINICAL DATA:  Abdominal pain nausea vomiting and diarrhea EXAM: CT ABDOMEN AND PELVIS WITHOUT CONTRAST TECHNIQUE: Multidetector CT imaging of the abdomen and pelvis was performed following the standard protocol without IV contrast. COMPARISON:  01/31/2013, 01/31/2013 FINDINGS: Lower chest: Hazy atelectasis within the posterior lung bases. No consolidation or pleural effusion. Coronary artery calcifications. Small hiatal hernia. Hepatobiliary: No focal liver abnormality is seen. Status post cholecystectomy. Stable prominent extrahepatic bile duct. Pancreas: Unremarkable. No pancreatic ductal dilatation or surrounding inflammatory changes. Spleen: Normal in size without focal abnormality. Adrenals/Urinary Tract: Stable 15 mm low-density mass in the right adrenal gland compatible with adenoma. Left adrenal gland is within normal limits. Low-density lesions within the kidneys, likely representing cysts. Negative for hydronephrosis. Multiple bladder diverticula. Stomach/Bowel: Stomach is nonenlarged. No  dilated small bowel. Suspected mild wall thickening of the descending colon and sigmoid colon with slight haziness in the surrounding fat. Appendix not well identified but no right lower quadrant inflammatory process. Vascular/Lymphatic: Aortic atherosclerosis. Stable 13 mm calcified aneurysm in the left upper abdomen. No significantly enlarged lymph nodes. Reproductive: Significantly enlarged prostate gland with coarse calcification Other: Negative for free air or free fluid. Small fluid or tissue density in the region of the umbilicus. Musculoskeletal: Degenerative changes. Stable marked compression deformity of L1. IMPRESSION: 1. Negative for bowel obstruction 2. Suspect mild bowel wall thickening/colitis of the descending and sigmoid colon. This may be secondary to infection, inflammation, or less likely ischemia. 3. Stable 15 mm right adrenal gland adenoma 4. Markedly enlarged prostate gland as before 5. Stable marked compression fracture of L1 Electronically Signed   By: Jasmine Pang M.D.   On: 09/24/2016 21:02   Dg Chest 1 View  Result Date: 09/24/2016 CLINICAL DATA:  Abdominal pain, atrial fibrillation, hypertension EXAM: CHEST 1 VIEW COMPARISON:  01/31/2013 FINDINGS: Enlargement of cardiac silhouette. Atherosclerotic calcification and elongation of thoracic aorta. Mediastinal contours and pulmonary vascularity otherwise normal. Interstitial infiltrates in the mid to lower lungs bilaterally favor pulmonary edema over infection. No pleural effusion or pneumothorax. Bones demineralized. IMPRESSION: Enlargement of cardiac silhouette. Interstitial infiltrates at the mid to lower lungs favoring pulmonary edema. Electronically Signed   By: Ulyses Southward M.D.   On: 09/24/2016 20:00    My personal review of EKG: Rhythm NSR   Assessment & Plan:    Active Problems:   Hypothyroidism following radioiodine therapy   AKI (acute kidney injury) (HCC)   Colitis   1. Colitis- patient presented with mild bowel  wall thickening/colitis of descending and sigmoid colon, could be infectious versus inflammatory versus ischemic. Patient will be started on Cipro and Flagyl empirically for infection. Though he has normal white count and no fever. Follow CBC in a.m. 2. Acute kidney injury- patient's BUN/creatinine 44/2.02. His previous BUN/creatinine from 10/17 was 27/1.09. Likely from diarrhea and HCTZ. Will hold HCTZ, started on IV normal saline at 100 ML per hour. Follow BMP in a.m. 3. Hypokalemia-potassium is 2.9, replace potassium with IV KCl 10 mg 3.  4. Chronic atrial fibrillation-heart rate is controlled, will hold digoxin due to hypokalemia. Consider restarting digoxin when patients electrolytes are balanced. Continue anticoagulation  with eliquis. 5. Hypothyroidism-continue levothyroxine.   DVT Prophylaxis-   Eliquis  AM Labs Ordered, also please review Full Orders  Family Communication: Admission, patients condition and plan of care including tests being ordered have been discussed with the patient and his daughters at bedside who indicate understanding and agree with the plan and Code Status.  Code Status:  Full code  Admission status: Observation    Time spent in minutes : 60 minutes   Mansoor Hillyard S M.D on 09/24/2016 at 11:51 PM  Between 7am to 7pm - Pager - [phone]. After 7pm go to [email or web address] - password [facility]  [facility] - Office  [phone]

## 2016-09-24 NOTE — ED Notes (Signed)
Pt transported to CT ?

## 2016-09-24 NOTE — ED Provider Notes (Signed)
Medical screening examination/treatment/procedure(s) were conducted as a shared visit with non-physician practitioner(s) and myself.  I personally evaluated the patient during the encounter.  Clinical Impression:   Final diagnoses:  Colitis  Acute kidney injury (HCC)  Nausea and vomiting, intractability of vomiting not specified, unspecified vomiting type  Hypokalemia    The pt has had diarrhea and vomiting since last night - complaining of upper abdominal pain - no travel, no recent abx, no other sick contacts - the pt lives by himself,   On exam has ttp in the upper abdomen, increased bowel sounds, lungs without distress or abnormal lung findigns No edema  Bedside US - not archived - no free fluid, no AAA.  Needs fluids, meds and CT.     Eber Hong, MD 10/01/16 [phone]

## 2016-09-24 NOTE — ED Triage Notes (Signed)
Patient complains of abdominal pain, nausea, vomiting and diarrhea that started last night. States stomach cramping. [name] states diarrhea is running clear now.

## 2016-09-24 NOTE — ED Notes (Signed)
EKG given to DR. Miller.

## 2016-09-25 ENCOUNTER — Encounter (HOSPITAL_COMMUNITY): Payer: Self-pay

## 2016-09-25 DIAGNOSIS — N179 Acute kidney failure, unspecified: Secondary | ICD-10-CM | POA: Diagnosis not present

## 2016-09-25 DIAGNOSIS — E876 Hypokalemia: Secondary | ICD-10-CM | POA: Diagnosis not present

## 2016-09-25 LAB — MAGNESIUM: MAGNESIUM: 0.7 mg/dL — AB (ref 1.7–2.4)

## 2016-09-25 LAB — COMPREHENSIVE METABOLIC PANEL
ALT: 26 U/L (ref 17–63)
AST: 41 U/L (ref 15–41)
Albumin: 3.2 g/dL — ABNORMAL LOW (ref 3.5–5.0)
Alkaline Phosphatase: 46 U/L (ref 38–126)
Anion gap: 8 (ref 5–15)
BUN: 36 mg/dL — AB (ref 6–20)
CHLORIDE: 107 mmol/L (ref 101–111)
CO2: 22 mmol/L (ref 22–32)
Calcium: 7.1 mg/dL — ABNORMAL LOW (ref 8.9–10.3)
Creatinine, Ser: 1.58 mg/dL — ABNORMAL HIGH (ref 0.61–1.24)
GFR calc Af Amer: 42 mL/min — ABNORMAL LOW (ref >60)
GFR, EST NON AFRICAN AMERICAN: 36 mL/min — AB (ref >60)
Glucose, Bld: 100 mg/dL — ABNORMAL HIGH (ref 65–99)
POTASSIUM: 3 mmol/L — AB (ref 3.5–5.1)
Sodium: 137 mmol/L (ref 135–145)
Total Bilirubin: 0.9 mg/dL (ref 0.3–1.2)
Total Protein: 5.8 g/dL — ABNORMAL LOW (ref 6.5–8.1)

## 2016-09-25 LAB — URINALYSIS, ROUTINE W REFLEX MICROSCOPIC
BILIRUBIN URINE: NEGATIVE
Glucose, UA: NEGATIVE mg/dL
HGB URINE DIPSTICK: NEGATIVE
Ketones, ur: 5 mg/dL — AB
LEUKOCYTES UA: NEGATIVE
NITRITE: NEGATIVE
PH: 5 (ref 5.0–8.0)
Protein, ur: 30 mg/dL — AB
SPECIFIC GRAVITY, URINE: 1.015 (ref 1.005–1.030)

## 2016-09-25 LAB — CBC
HCT: 34.9 % — ABNORMAL LOW (ref 39.0–52.0)
Hemoglobin: 12.3 g/dL — ABNORMAL LOW (ref 13.0–17.0)
MCH: 31.6 pg (ref 26.0–34.0)
MCHC: 35.2 g/dL (ref 30.0–36.0)
MCV: 89.7 fL (ref 78.0–100.0)
PLATELETS: 250 10*3/uL (ref 150–400)
RBC: 3.89 MIL/uL — ABNORMAL LOW (ref 4.22–5.81)
RDW: 14.7 % (ref 11.5–15.5)
WBC: 5.3 10*3/uL (ref 4.0–10.5)

## 2016-09-25 MED ORDER — ORAL CARE MOUTH RINSE: 15.0000 mL | Freq: Two times a day (BID) | OROMUCOSAL | Status: DC

## 2016-09-25 MED ORDER — ONDANSETRON HCL 4 MG PO TABS: 4.0000 mg | ORAL_TABLET | Freq: Four times a day (QID) | ORAL | Status: DC | PRN

## 2016-09-25 MED ORDER — CIPROFLOXACIN HCL 250 MG PO TABS
250.0000 mg | ORAL_TABLET | Freq: Two times a day (BID) | ORAL | Status: DC
  Administered 2016-09-25 – 2016-09-26 (×2): 250 mg via ORAL
  Filled 2016-09-25 (×3): qty 1

## 2016-09-25 MED ORDER — POTASSIUM CHLORIDE 10 MEQ/100ML IV SOLN
10.0000 meq | INTRAVENOUS | Status: AC
  Administered 2016-09-25 (×2): 10 meq via INTRAVENOUS
  Filled 2016-09-25 (×2): qty 100

## 2016-09-25 MED ORDER — MAGNESIUM SULFATE 4 GM/100ML IV SOLN
4.0000 g | Freq: Once | INTRAVENOUS | Status: AC
  Administered 2016-09-25: 4 g via INTRAVENOUS
  Filled 2016-09-25: qty 100

## 2016-09-25 MED ORDER — LEVOTHYROXINE SODIUM 75 MCG PO TABS
75.0000 ug | ORAL_TABLET | Freq: Every day | ORAL | Status: DC
  Administered 2016-09-25 – 2016-09-26 (×2): 75 ug via ORAL
  Filled 2016-09-25 (×2): qty 1

## 2016-09-25 MED ORDER — POTASSIUM CHLORIDE CRYS ER 20 MEQ PO TBCR
40.0000 meq | EXTENDED_RELEASE_TABLET | ORAL | Status: AC
  Administered 2016-09-25: 40 meq via ORAL
  Filled 2016-09-25 (×2): qty 2

## 2016-09-25 MED ORDER — SODIUM CHLORIDE 0.9 % IV SOLN
INTRAVENOUS | Status: DC
  Administered 2016-09-25 (×2): via INTRAVENOUS

## 2016-09-25 MED ORDER — METRONIDAZOLE IN NACL 5-0.79 MG/ML-% IV SOLN
500.0000 mg | Freq: Three times a day (TID) | INTRAVENOUS | Status: DC
  Administered 2016-09-25 (×2): 500 mg via INTRAVENOUS
  Filled 2016-09-25 (×2): qty 100

## 2016-09-25 MED ORDER — CIPROFLOXACIN IN D5W 400 MG/200ML IV SOLN: 400.0000 mg | INTRAVENOUS | Status: DC

## 2016-09-25 MED ORDER — METRONIDAZOLE 500 MG PO TABS
500.0000 mg | ORAL_TABLET | Freq: Three times a day (TID) | ORAL | Status: DC
  Administered 2016-09-25 – 2016-09-26 (×3): 500 mg via ORAL
  Filled 2016-09-25 (×3): qty 1

## 2016-09-25 MED ORDER — ONDANSETRON HCL 4 MG/2ML IJ SOLN
4.0000 mg | Freq: Four times a day (QID) | INTRAMUSCULAR | Status: DC | PRN
  Administered 2016-09-25: 4 mg via INTRAVENOUS
  Filled 2016-09-25: qty 2

## 2016-09-25 MED ORDER — APIXABAN 2.5 MG PO TABS
2.5000 mg | ORAL_TABLET | Freq: Two times a day (BID) | ORAL | Status: DC
Start: 2016-09-25 — End: 2016-09-26
  Administered 2016-09-25 – 2016-09-26 (×4): 2.5 mg via ORAL
  Filled 2016-09-25 (×4): qty 1

## 2016-09-25 MED ORDER — CIPROFLOXACIN IN D5W 400 MG/200ML IV SOLN
400.0000 mg | Freq: Once | INTRAVENOUS | Status: AC
  Administered 2016-09-25: 400 mg via INTRAVENOUS
  Filled 2016-09-25: qty 200

## 2016-09-25 MED ORDER — CHLORHEXIDINE GLUCONATE 0.12 % MT SOLN
15.0000 mL | Freq: Two times a day (BID) | OROMUCOSAL | Status: DC
  Administered 2016-09-25 – 2016-09-26 (×3): 15 mL via OROMUCOSAL
  Filled 2016-09-25 (×3): qty 15

## 2016-09-25 NOTE — Progress Notes (Signed)
ANTIBIOTIC CONSULT NOTE-Preliminary  Pharmacy Consult for ciprofloxacin Indication: intra-abdominal infection/ colitis  Allergies  Allergen Reactions  . Cardura [Doxazosin Mesylate]     Patient Measurements: Height: 5' (152.4 cm) Weight: 97 lb 1.6 oz (44 kg) IBW/kg (Calculated) : 50 Adjusted Body Weight:   Vital Signs: Temp: 98.2 F (36.8 C) (08/28 0045) Temp Source: Oral (08/28 0045) BP: 109/71 (08/28 0045) Pulse Rate: 84 (08/28 0045)  Labs:  Recent Labs  09/24/16 1720  WBC 6.3  HGB 14.9  PLT 308  CREATININE 2.02*    Estimated Creatinine Clearance: 14.5 mL/min (A) (by C-G formula based on SCr of 2.02 mg/dL (H)).  No results for input(s): VANCOTROUGH, VANCOPEAK, VANCORANDOM, GENTTROUGH, GENTPEAK, GENTRANDOM, TOBRATROUGH, TOBRAPEAK, TOBRARND, AMIKACINPEAK, AMIKACINTROU, AMIKACIN in the last 72 hours.   Microbiology: No results found for this or any previous visit (from the past 720 hour(s)).  Medical History: Past Medical History:  Diagnosis Date  . Atrial fibrillation (HCC)   . Elevated PSA   . Hypertension   . Hypothyroid   . Insomnia   . Kidney stone   . Polymyalgia rheumatica (HCC)   . Pressure ulcer   . Reflux     Medications:  Scheduled:  . apixaban  2.5 mg Oral BID  . levothyroxine  75 mcg Oral QAC breakfast   Infusions:  . sodium chloride 100 mL/hr at 09/25/16 0113  . metronidazole Stopped (09/25/16 0216)   PRN: ondansetron **OR** ondansetron (ZOFRAN) IV Anti-infectives    Start     Dose/Rate Route Frequency Ordered Stop   09/25/16 0100  metroNIDAZOLE (FLAGYL) IVPB 500 mg     500 mg 100 mL/hr over 60 Minutes Intravenous Every 8 hours 09/25/16 0045     09/25/16 0100  ciprofloxacin (CIPRO) IVPB 400 mg     400 mg 200 mL/hr over 60 Minutes Intravenous  Once 09/25/16 0058 09/25/16 0316      Assessment: 83 yo male with bowel wall thickening/colitis. Pharmacy has been consulted for ciprofloxacin dosing.   Plan:  Preliminary review of  pertinent patient information completed.  Protocol will be initiated with dose(s) of ciprofloxacin 400mg .  Jeani Hawking clinical pharmacist will complete review during morning rounds to assess patient and finalize treatment regimen if needed.  Braian Tijerina Scarlett, RPH 09/25/2016,3:17 AM

## 2016-09-25 NOTE — Progress Notes (Addendum)
PROGRESS NOTE    Jeffrey Mccarty  KGM:[id number] DOB: 13-Nov-1921 DOA: 09/24/2016 PCP: [name], MD     Brief Narrative:  83 y/o man admitted from home on 8/27 due to vomiting and diarrhea for 2 days. Found to have colitis and acute renal failure.   Assessment & Plan:   Active Problems:   Hypothyroidism following radioiodine therapy   AKI (acute kidney injury) (HCC)   Colitis   Descending/sigmoid colitis -Likely the cause of patient's nausea, vomiting, diarrhea. -He is much improved today, has had no further vomiting or diarrhea since admission. -Has tolerated clear liquids, will continue to advance diet and change antibiotics to the oral route.  Acute renal failure -Due to prerenal azotemia. Improved with IV fluids. -Recheck renal function in a.m.  Hypokalemia -Continue to replace orally. -Check magnesium and replace as necessary.  Hypothyroidism -Continue Synthroid  Chronic atrial fibrillation -Rate is controlled. -Anticoagulated on Eliquis   DVT prophylaxis: Eliquis Code Status: Full code Family Communication: Daughter and sister updated on plan of care Disposition Plan: Plan discharge home in 24 hours as long as tolerates increase in diet and change to oral medications and renal function has improved.  Consultants:   None  Procedures:   None  Antimicrobials:  Anti-infectives    Start     Dose/Rate Route Frequency Ordered Stop   09/25/16 2200  ciprofloxacin (CIPRO) IVPB 400 mg     400 mg 200 mL/hr over 60 Minutes Intravenous Every 24 hours 09/25/16 0823     09/25/16 0100  metroNIDAZOLE (FLAGYL) IVPB 500 mg     500 mg 100 mL/hr over 60 Minutes Intravenous Every 8 hours 09/25/16 0045     09/25/16 0100  ciprofloxacin (CIPRO) IVPB 400 mg     400 mg 200 mL/hr over 60 Minutes Intravenous  Once 09/25/16 0058 09/25/16 0345       Subjective: Very hard of hearing, feels better, no further abdominal pain or diarrhea, once his diet  increased.  Objective: Vitals:   09/24/16 2133 09/25/16 0020 09/25/16 0045 09/25/16 0625  BP: 104/84 113/65 109/71 (!) 107/55  Pulse: 76 87 84 65  Resp: 17 (!) 21 20 20   Temp:   98.2 F (36.8 C) 97.9 F (36.6 C)  TempSrc:   Oral Oral  SpO2: 93% 96% 98% 99%  Weight:   44 kg (97 lb 1.6 oz)   Height:   5' (1.524 m)     Intake/Output Summary (Last 24 hours) at 09/25/16 1522 Last data filed at 09/25/16 0853  Gross per 24 hour  Intake           651.67 ml  Output              200 ml  Net           451.67 ml   Filed Weights   09/24/16 1719 09/25/16 0045  Weight: 47.2 kg (104 lb) 44 kg (97 lb 1.6 oz)    Examination:  General exam: Alert, awake, oriented x 3, Hard of hearing Respiratory system: Clear to auscultation. Respiratory effort normal. Cardiovascular system:RRR. No murmurs, rubs, gallops. Gastrointestinal system: Abdomen is nondistended, soft and nontender. No organomegaly or masses felt. Normal bowel sounds heard. Central nervous system: Alert and oriented. No focal neurological deficits. Extremities: No C/C/E, +pedal pulses Skin: No rashes, lesions or ulcers Psychiatry: Judgement and insight appear normal. Mood & affect appropriate.     Data Reviewed: I have personally reviewed following labs and imaging studies  CBC:  Recent Labs Lab 09/24/16 1720 09/25/16 0616  WBC 6.3 5.3  HGB 14.9 12.3*  HCT 41.7 34.9*  MCV 88.5 89.7  PLT 308 250   Basic Metabolic Panel:  Recent Labs Lab 09/24/16 1720 09/25/16 0616  NA 136 137  K 2.9* 3.0*  CL 101 107  CO2 22 22  GLUCOSE 92 100*  BUN 44* 36*  CREATININE 2.02* 1.58*  CALCIUM 8.1* 7.1*   GFR: Estimated Creatinine Clearance: 18.6 mL/min (A) (by C-G formula based on SCr of 1.58 mg/dL (H)). Liver Function Tests:  Recent Labs Lab 09/24/16 1720 09/25/16 0616  AST 45* 41  ALT 31 26  ALKPHOS 63 46  BILITOT 0.8 0.9  PROT 7.4 5.8*  ALBUMIN 4.1 3.2*    Recent Labs Lab 09/24/16 1720  LIPASE 76*   No  results for input(s): AMMONIA in the last 168 hours. Coagulation Profile: No results for input(s): INR, PROTIME in the last 168 hours. Cardiac Enzymes: No results for input(s): CKTOTAL, CKMB, CKMBINDEX, TROPONINI in the last 168 hours. BNP (last 3 results) No results for input(s): PROBNP in the last 8760 hours. HbA1C: No results for input(s): HGBA1C in the last 72 hours. CBG: No results for input(s): GLUCAP in the last 168 hours. Lipid Profile: No results for input(s): CHOL, HDL, LDLCALC, TRIG, CHOLHDL, LDLDIRECT in the last 72 hours. Thyroid Function Tests: No results for input(s): TSH, T4TOTAL, FREET4, T3FREE, THYROIDAB in the last 72 hours. Anemia Panel: No results for input(s): VITAMINB12, FOLATE, FERRITIN, TIBC, IRON, RETICCTPCT in the last 72 hours. Urine analysis:    Component Value Date/Time   COLORURINE YELLOW 09/24/2016 2326   APPEARANCEUR CLEAR 09/24/2016 2326   LABSPEC 1.015 09/24/2016 2326   PHURINE 5.0 09/24/2016 2326   GLUCOSEU NEGATIVE 09/24/2016 2326   HGBUR NEGATIVE 09/24/2016 2326   BILIRUBINUR NEGATIVE 09/24/2016 2326   KETONESUR 5 (A) 09/24/2016 2326   PROTEINUR 30 (A) 09/24/2016 2326   UROBILINOGEN 0.2 02/09/2013 0336   NITRITE NEGATIVE 09/24/2016 2326   LEUKOCYTESUR NEGATIVE 09/24/2016 2326   Sepsis Labs: @LABRCNTIP (procalcitonin:4,lacticidven:4)  )No results found for this or any previous visit (from the past 240 hour(s)).       Radiology Studies: Ct Abdomen Pelvis Wo Contrast  Result Date: 09/24/2016 CLINICAL DATA:  Abdominal pain nausea vomiting and diarrhea EXAM: CT ABDOMEN AND PELVIS WITHOUT CONTRAST TECHNIQUE: Multidetector CT imaging of the abdomen and pelvis was performed following the standard protocol without IV contrast. COMPARISON:  01/31/2013, 01/31/2013 FINDINGS: Lower chest: Hazy atelectasis within the posterior lung bases. No consolidation or pleural effusion. Coronary artery calcifications. Small hiatal hernia. Hepatobiliary: No  focal liver abnormality is seen. Status post cholecystectomy. Stable prominent extrahepatic bile duct. Pancreas: Unremarkable. No pancreatic ductal dilatation or surrounding inflammatory changes. Spleen: Normal in size without focal abnormality. Adrenals/Urinary Tract: Stable 15 mm low-density mass in the right adrenal gland compatible with adenoma. Left adrenal gland is within normal limits. Low-density lesions within the kidneys, likely representing cysts. Negative for hydronephrosis. Multiple bladder diverticula. Stomach/Bowel: Stomach is nonenlarged. No dilated small bowel. Suspected mild wall thickening of the descending colon and sigmoid colon with slight haziness in the surrounding fat. Appendix not well identified but no right lower quadrant inflammatory process. Vascular/Lymphatic: Aortic atherosclerosis. Stable 13 mm calcified aneurysm in the left upper abdomen. No significantly enlarged lymph nodes. Reproductive: Significantly enlarged prostate gland with coarse calcification Other: Negative for free air or free fluid. Small fluid or tissue density in the region of the umbilicus. Musculoskeletal: Degenerative changes. Stable marked compression  deformity of L1. IMPRESSION: 1. Negative for bowel obstruction 2. Suspect mild bowel wall thickening/colitis of the descending and sigmoid colon. This may be secondary to infection, inflammation, or less likely ischemia. 3. Stable 15 mm right adrenal gland adenoma 4. Markedly enlarged prostate gland as before 5. Stable marked compression fracture of L1 Electronically Signed   By: Jasmine Pang M.D.   On: 09/24/2016 21:02   Dg Chest 1 View  Result Date: 09/24/2016 CLINICAL DATA:  Abdominal pain, atrial fibrillation, hypertension EXAM: CHEST 1 VIEW COMPARISON:  01/31/2013 FINDINGS: Enlargement of cardiac silhouette. Atherosclerotic calcification and elongation of thoracic aorta. Mediastinal contours and pulmonary vascularity otherwise normal. Interstitial  infiltrates in the mid to lower lungs bilaterally favor pulmonary edema over infection. No pleural effusion or pneumothorax. Bones demineralized. IMPRESSION: Enlargement of cardiac silhouette. Interstitial infiltrates at the mid to lower lungs favoring pulmonary edema. Electronically Signed   By: Ulyses Southward M.D.   On: 09/24/2016 20:00        Scheduled Meds: . apixaban  2.5 mg Oral BID  . chlorhexidine  15 mL Mouth Rinse BID  . levothyroxine  75 mcg Oral QAC breakfast  . mouth rinse  15 mL Mouth Rinse q12n4p   Continuous Infusions: . sodium chloride 100 mL/hr at 09/25/16 1238  . ciprofloxacin    . metronidazole Stopped (09/25/16 0853)     LOS: 0 days    Time spent: 30 minutes. Greater than 50% of this time was spent in direct contact with the patient coordinating care.     Chaya Jan, MD [facility] Pager [phone]  If 7PM-7AM, please contact night-coverage [email or web address] Password [email or web address] 09/25/2016, 3:22 PM

## 2016-09-25 NOTE — Progress Notes (Signed)
Pharmacy Antibiotic Note  Jeffrey Mccarty is a 83 y.o. male admitted on 09/24/2016 with Intra-abdominal Infection.  Pharmacy has been consulted for Cipro dosing.  Plan: Cipro 400mg  IV every 24 hours. Monitor labs, micro and vitals.   Height: 5' (152.4 cm) Weight: 97 lb 1.6 oz (44 kg) IBW/kg (Calculated) : 50  Temp (24hrs), Avg:97.9 F (36.6 C), Min:97.6 F (36.4 C), Max:98.2 F (36.8 C)   Recent Labs Lab 09/24/16 1720 09/25/16 0616  WBC 6.3 5.3  CREATININE 2.02* 1.58*    Estimated Creatinine Clearance: 18.6 mL/min (A) (by C-G formula based on SCr of 1.58 mg/dL (H)).    Allergies  Allergen Reactions  . Cardura [Doxazosin Mesylate]    Antimicrobials this admission:  Cipro 8/28 >>  Flagyl 8/28 >>  Dose adjustments this admission:  Estimated Creatinine Clearance: 14.5 mL/min (A) (by C-G formula based on SCr of 2.02 mg/dL (H)).   Microbiology results:   BCx:   UCx:    Sputum:    MRSA PCR:    Thank you for allowing pharmacy to be a part of this patient's care.  Mady Gemma 09/25/2016 8:19 AM

## 2016-09-25 NOTE — Care Management Note (Signed)
Case Management Note  Patient Details  Name: Jeffrey Mccarty MRN: [id number] Date of Birth: 1921/04/25  Subjective/Objective:  Adm with AKI, colitis. From home alone, has 2 daughters who are very involved. They bring meals twice a day, he fixes his own breakfast. He is ind, still maintains his own yard work. Has PCP, daughter drives him to appointments. Has insurance with prescription coverage.                  Action/Plan: Anticipate DC home with self care/daughters.    Expected Discharge Date:      09/26/2016            Expected Discharge Plan:  Home/Self Care  In-House Referral:     Discharge planning Services  CM Consult  Post Acute Care Choice:  NA Choice offered to:  NA  DME Arranged:    DME Agency:     HH Arranged:    HH Agency:     Status of Service:  Completed, signed off  If discussed at [employer] of Stay Meetings, dates discussed:    Additional Comments:  Carissa Musick, Chrystine Oiler, RN 09/25/2016, 11:10 AM

## 2016-09-25 NOTE — Care Management Obs Status (Signed)
MEDICARE OBSERVATION STATUS NOTIFICATION   Patient Details  Name: Jeffrey Mccarty MRN: [id number] Date of Birth: 04-21-1921   Medicare Observation Status Notification Given:  Yes    Daxon Kyne, Chrystine Oiler, RN 09/25/2016, 11:10 AM

## 2016-09-26 LAB — BASIC METABOLIC PANEL
ANION GAP: 7 (ref 5–15)
BUN: 23 mg/dL — ABNORMAL HIGH (ref 6–20)
CO2: 22 mmol/L (ref 22–32)
Calcium: 7.1 mg/dL — ABNORMAL LOW (ref 8.9–10.3)
Chloride: 108 mmol/L (ref 101–111)
Creatinine, Ser: 1.24 mg/dL (ref 0.61–1.24)
GFR calc Af Amer: 56 mL/min — ABNORMAL LOW (ref >60)
GFR calc non Af Amer: 49 mL/min — ABNORMAL LOW (ref >60)
GLUCOSE: 78 mg/dL (ref 65–99)
POTASSIUM: 3.4 mmol/L — AB (ref 3.5–5.1)
Sodium: 137 mmol/L (ref 135–145)

## 2016-09-26 LAB — CBC
HEMATOCRIT: 33.9 % — AB (ref 39.0–52.0)
HEMOGLOBIN: 11.8 g/dL — AB (ref 13.0–17.0)
MCH: 31.3 pg (ref 26.0–34.0)
MCHC: 34.8 g/dL (ref 30.0–36.0)
MCV: 89.9 fL (ref 78.0–100.0)
Platelets: 228 10*3/uL (ref 150–400)
RBC: 3.77 MIL/uL — ABNORMAL LOW (ref 4.22–5.81)
RDW: 14.8 % (ref 11.5–15.5)
WBC: 5.8 10*3/uL (ref 4.0–10.5)

## 2016-09-26 MED ORDER — METRONIDAZOLE 500 MG PO TABS: 500.0000 mg | ORAL_TABLET | Freq: Three times a day (TID) | ORAL | 0 refills | Status: DC

## 2016-09-26 MED ORDER — HYDROCODONE-ACETAMINOPHEN 5-325 MG PO TABS
2.0000 | ORAL_TABLET | Freq: Once | ORAL | Status: AC
  Administered 2016-09-26: 2 via ORAL
  Filled 2016-09-26: qty 2

## 2016-09-26 MED ORDER — ACETAMINOPHEN 325 MG PO TABS: 650.0000 mg | ORAL_TABLET | Freq: Four times a day (QID) | ORAL | Status: DC | PRN

## 2016-09-26 MED ORDER — CIPROFLOXACIN HCL 250 MG PO TABS: 250.0000 mg | ORAL_TABLET | Freq: Two times a day (BID) | ORAL | 0 refills | Status: DC

## 2016-09-26 NOTE — Progress Notes (Signed)
Patient's IV removed.  Site WNL.  AVS reviewed with patient's two daughters.  Verbalized understanding of discharge instructions, physician follow-up, medications.  Medications to be picked up at pharmacy - daughters aware.  Patient awaiting transport at eating lunch.  Patient stable.

## 2016-09-26 NOTE — Discharge Summary (Signed)
Physician Discharge Summary  Jeffrey Mccarty:[id number] DOB: 1921-12-30 DOA: 09/24/2016  PCP: [name], MD  Admit date: 09/24/2016 Discharge date: 09/26/2016  Admitted From:  Home.  Disposition:  Home.   Recommendations for Outpatient Follow-up:  1. Follow up with PCP in 1-2 weeks  Home Health: None.  Equipment/Devices: None.  Discharge Condition: Cr back to baseline.  No diarrhea.  CODE STATUS: FULL CODE.  Diet recommendation:  As tolerated.   Brief/Interim Summary:  Patient was admitted into the hospital for AKI and colitis by Dr Sharl Ma on Sep 24, 2016.  As per his H and P:  "  Jeffrey Mccarty  is a 83 y.o. male, With history of chronic atrial fibrillation, on digoxin and Eliquis for anti-coagulation, who was brought to hospital by his daughters who complaints of diarrhea and vomiting of 2 days duration. Patient lives by himself and called his daughter and told him that he was not feeling good and had multiple loose bowel movements today. He also had multiple episodes of vomiting at home. He denies passing out. No chest or shortness of breath. No fever. No dysuria. He does have history of BPH. He denies any blood in vomitus or stool. Patient recently completed course of prednisone for gout.  In the ED, lab work showed acute kidney injury, BUN/creatinine 44/2.02. His previous BUN/creatinine from 10/17 was 27/1.09. CT of the abdomen and pelvis showed mild bowel wall thickening/colitis of the descending and sigmoid colon, this could be due to infection, inflammation or less likely ischemia.  HOSPITAL COURSE: patient was admitted into the hospital, and he was given IV Cipro and Flagyl, and was given IVF.  His diuretics and his ACE were held.  His AKI improved, and resolved, and his Cr came back to baseline.  His Dig was held during admission, due to hypokalemia, and his electrolytes were repleted.  He improved, and no longer has any diarrhea.  He was able to eat and ambulate.  For his afib,  anticoagulation was continued.  For his hypothyroidism, his levothyroxine was continued.  He feels well now, anxious to go home, and will be discharged to home today.  He will hold off on diuretics and ACE I until he sees his PCP.  He will finish a 5 day course of oral Cipro and Flagyl, not using alcohol while on Flagyl.  Thank you for allowing me to partipipate in his care.  Good Day.   Discharge Diagnoses:  Active Problems:   Hypothyroidism following radioiodine therapy   AKI (acute kidney injury) (HCC)   Colitis  Discharge Instructions  Discharge Instructions    Diet - low sodium heart healthy    Complete by:  As directed    Discharge instructions    Complete by:  As directed    Take your medicine as directed.  Finish the antibiotics even if you feel better.  Follow up with your doctor next week.  No alcohol on Flagyl.   Increase activity slowly    Complete by:  As directed      Allergies as of 09/26/2016      Reactions   Cardura [doxazosin Mesylate]       Medication List    STOP taking these medications   benazepril 5 MG tablet Commonly known as:  LOTENSIN   colchicine 0.6 MG tablet   hydrochlorothiazide 12.5 MG capsule Commonly known as:  MICROZIDE   predniSONE 10 MG tablet Commonly known as:  DELTASONE     TAKE these medications  ciprofloxacin 250 MG tablet Commonly known as:  CIPRO Take 1 tablet (250 mg total) by mouth 2 (two) times daily.   digoxin 0.125 MG tablet Commonly known as:  DIGITEK Take 1 tablet (125 mcg total) by mouth every other day.   ELIQUIS 2.5 MG Tabs tablet Generic drug:  apixaban take 1 tablet by mouth twice a day   esomeprazole 40 MG capsule Commonly known as:  NEXIUM Take 1 capsule (40 mg total) by mouth daily.   feeding supplement (ENSURE COMPLETE) Liqd Take 237 mLs by mouth 2 (two) times daily between meals.   levothyroxine 75 MCG tablet Commonly known as:  SYNTHROID, LEVOTHROID Take 1 tablet (75 mcg total) by mouth daily  before breakfast.   metroNIDAZOLE 500 MG tablet Commonly known as:  FLAGYL Take 1 tablet (500 mg total) by mouth every 8 (eight) hours.   triamcinolone ointment 0.1 % Commonly known as:  KENALOG apply to affected area twice a day What changed:  how much to take  how to take this  when to take this  additional instructions            Discharge Care Instructions        Start     Ordered   09/26/16 0000  ciprofloxacin (CIPRO) 250 MG tablet  2 times daily     09/26/16 0851   09/26/16 0000  metroNIDAZOLE (FLAGYL) 500 MG tablet  Every 8 hours     09/26/16 0851   09/26/16 0000  Increase activity slowly     09/26/16 0851   09/26/16 0000  Diet - low sodium heart healthy     09/26/16 0851   09/26/16 0000  Discharge instructions    Comments:  Take your medicine as directed.  Finish the antibiotics even if you feel better.  Follow up with your doctor next week.  No alcohol on Flagyl.   09/26/16 0851      Allergies  Allergen Reactions  . Cardura [Doxazosin Mesylate]     Consultations: None.     Procedures/Studies: Ct Abdomen Pelvis Wo Contrast  Result Date: 09/24/2016 CLINICAL DATA:  Abdominal pain nausea vomiting and diarrhea EXAM: CT ABDOMEN AND PELVIS WITHOUT CONTRAST TECHNIQUE: Multidetector CT imaging of the abdomen and pelvis was performed following the standard protocol without IV contrast. COMPARISON:  01/31/2013, 01/31/2013 FINDINGS: Lower chest: Hazy atelectasis within the posterior lung bases. No consolidation or pleural effusion. Coronary artery calcifications. Small hiatal hernia. Hepatobiliary: No focal liver abnormality is seen. Status post cholecystectomy. Stable prominent extrahepatic bile duct. Pancreas: Unremarkable. No pancreatic ductal dilatation or surrounding inflammatory changes. Spleen: Normal in size without focal abnormality. Adrenals/Urinary Tract: Stable 15 mm low-density mass in the right adrenal gland compatible with adenoma. Left adrenal gland  is within normal limits. Low-density lesions within the kidneys, likely representing cysts. Negative for hydronephrosis. Multiple bladder diverticula. Stomach/Bowel: Stomach is nonenlarged. No dilated small bowel. Suspected mild wall thickening of the descending colon and sigmoid colon with slight haziness in the surrounding fat. Appendix not well identified but no right lower quadrant inflammatory process. Vascular/Lymphatic: Aortic atherosclerosis. Stable 13 mm calcified aneurysm in the left upper abdomen. No significantly enlarged lymph nodes. Reproductive: Significantly enlarged prostate gland with coarse calcification Other: Negative for free air or free fluid. Small fluid or tissue density in the region of the umbilicus. Musculoskeletal: Degenerative changes. Stable marked compression deformity of L1. IMPRESSION: 1. Negative for bowel obstruction 2. Suspect mild bowel wall thickening/colitis of the descending and sigmoid colon. This may be  secondary to infection, inflammation, or less likely ischemia. 3. Stable 15 mm right adrenal gland adenoma 4. Markedly enlarged prostate gland as before 5. Stable marked compression fracture of L1 Electronically Signed   By: Jasmine Pang M.D.   On: 09/24/2016 21:02   Dg Chest 1 View  Result Date: 09/24/2016 CLINICAL DATA:  Abdominal pain, atrial fibrillation, hypertension EXAM: CHEST 1 VIEW COMPARISON:  01/31/2013 FINDINGS: Enlargement of cardiac silhouette. Atherosclerotic calcification and elongation of thoracic aorta. Mediastinal contours and pulmonary vascularity otherwise normal. Interstitial infiltrates in the mid to lower lungs bilaterally favor pulmonary edema over infection. No pleural effusion or pneumothorax. Bones demineralized. IMPRESSION: Enlargement of cardiac silhouette. Interstitial infiltrates at the mid to lower lungs favoring pulmonary edema. Electronically Signed   By: Ulyses Southward M.D.   On: 09/24/2016 20:00       Subjective: Feeling better.    Discharge Exam: Vitals:   09/25/16 2134 09/26/16 0554  BP: (!) 97/48 (!) 102/53  Pulse: 66 72  Resp: 16 16  Temp: 98.2 F (36.8 C) (!) 97.5 F (36.4 C)  SpO2: 97% 99%   Vitals:   09/25/16 0625 09/25/16 2001 09/25/16 2134 09/26/16 0554  BP: (!) 107/55  (!) 97/48 (!) 102/53  Pulse: 65  66 72  Resp: 20  16 16   Temp: 97.9 F (36.6 C)  98.2 F (36.8 C) (!) 97.5 F (36.4 C)  TempSrc: Oral  Oral Oral  SpO2: 99% 93% 97% 99%  Weight:      Height:        General: Pt is alert, awake, not in acute distress Cardiovascular: RRR, S1/S2 +, no rubs, no gallops Respiratory: CTA bilaterally, no wheezing, no rhonchi Abdominal: Soft, NT, ND, bowel sounds + Extremities: no edema, no cyanosis    The results of significant diagnostics from this hospitalization (including imaging, microbiology, ancillary and laboratory) are listed below for reference.     Basic Metabolic Panel:  Recent Labs Lab 09/24/16 1720 09/25/16 0616 09/25/16 1558 09/26/16 0423  NA 136 137  --  137  K 2.9* 3.0*  --  3.4*  CL 101 107  --  108  CO2 22 22  --  22  GLUCOSE 92 100*  --  78  BUN 44* 36*  --  23*  CREATININE 2.02* 1.58*  --  1.24  CALCIUM 8.1* 7.1*  --  7.1*  MG  --   --  0.7*  --    Liver Function Tests:  Recent Labs Lab 09/24/16 1720 09/25/16 0616  AST 45* 41  ALT 31 26  ALKPHOS 63 46  BILITOT 0.8 0.9  PROT 7.4 5.8*  ALBUMIN 4.1 3.2*    Recent Labs Lab 09/24/16 1720  LIPASE 76*   CBC:  Recent Labs Lab 09/24/16 1720 09/25/16 0616 09/26/16 0423  WBC 6.3 5.3 5.8  HGB 14.9 12.3* 11.8*  HCT 41.7 34.9* 33.9*  MCV 88.5 89.7 89.9  PLT 308 250 228   Urinalysis    Component Value Date/Time   COLORURINE YELLOW 09/24/2016 2326   APPEARANCEUR CLEAR 09/24/2016 2326   LABSPEC 1.015 09/24/2016 2326   PHURINE 5.0 09/24/2016 2326   GLUCOSEU NEGATIVE 09/24/2016 2326   HGBUR NEGATIVE 09/24/2016 2326   BILIRUBINUR NEGATIVE 09/24/2016 2326   KETONESUR 5 (A) 09/24/2016 2326    PROTEINUR 30 (A) 09/24/2016 2326   UROBILINOGEN 0.2 02/09/2013 0336   NITRITE NEGATIVE 09/24/2016 2326   LEUKOCYTESUR NEGATIVE 09/24/2016 2326    Time coordinating discharge: Over 30 minutes SIGNED:  Houston SirenLE,Aileene Lanum, MD FACP [facility] 09/26/2016, 8:52 AM   If 7PM-7AM, please contact night-coverage [email or web address] Password [email or web address]

## 2016-09-27 ENCOUNTER — Telehealth: Payer: Self-pay | Admitting: *Deleted

## 2016-09-27 NOTE — Telephone Encounter (Signed)
Jeffrey Mccarty, [name], MD         Call pt's family (pt cannot hear on [place]) see hiw hes doing and it he taking his med and make sure appt for next wk)    Patient has appointment for hospital follow up scheduled 10/03/16 with Dr Brett CanalesSteve

## 2016-09-28 ENCOUNTER — Emergency Department (HOSPITAL_COMMUNITY)
Admission: EM | Admit: 2016-09-28 | Discharge: 2016-09-28 | Disposition: A | Discharge status: Home Health | Payer: Medicare HMO | Attending: Emergency Medicine | Admitting: Emergency Medicine

## 2016-09-28 DIAGNOSIS — N3001 Acute cystitis with hematuria: Secondary | ICD-10-CM

## 2016-09-28 DIAGNOSIS — I10 Essential (primary) hypertension: Secondary | ICD-10-CM | POA: Insufficient documentation

## 2016-09-28 DIAGNOSIS — N3091 Cystitis, unspecified with hematuria: Secondary | ICD-10-CM | POA: Diagnosis not present

## 2016-09-28 DIAGNOSIS — E039 Hypothyroidism, unspecified: Secondary | ICD-10-CM | POA: Insufficient documentation

## 2016-09-28 DIAGNOSIS — R112 Nausea with vomiting, unspecified: Secondary | ICD-10-CM | POA: Diagnosis present

## 2016-09-28 DIAGNOSIS — Z79899 Other long term (current) drug therapy: Secondary | ICD-10-CM | POA: Insufficient documentation

## 2016-09-28 DIAGNOSIS — R197 Diarrhea, unspecified: Secondary | ICD-10-CM | POA: Diagnosis not present

## 2016-09-28 DIAGNOSIS — R339 Retention of urine, unspecified: Secondary | ICD-10-CM | POA: Diagnosis not present

## 2016-09-28 LAB — URINALYSIS, ROUTINE W REFLEX MICROSCOPIC
Bilirubin Urine: NEGATIVE
Glucose, UA: NEGATIVE mg/dL
KETONES UR: NEGATIVE mg/dL
LEUKOCYTES UA: NEGATIVE
NITRITE: POSITIVE — AB
PH: 5.5 (ref 5.0–8.0)
Protein, ur: 30 mg/dL — AB

## 2016-09-28 LAB — URINALYSIS, MICROSCOPIC (REFLEX)

## 2016-09-28 MED ORDER — CEPHALEXIN 500 MG PO CAPS: 500.0000 mg | ORAL_CAPSULE | Freq: Three times a day (TID) | ORAL | 0 refills | Status: DC

## 2016-09-28 MED ORDER — CEPHALEXIN 500 MG PO CAPS
500.0000 mg | ORAL_CAPSULE | Freq: Once | ORAL | Status: AC
  Administered 2016-09-28: 500 mg via ORAL
  Filled 2016-09-28: qty 1

## 2016-09-28 NOTE — ED Provider Notes (Signed)
[facility]-EMERGENCY DEPT Provider Note   CSN: [id number] Arrival date & time: 09/28/16  0608  Time seen 6:28 AM   History   Chief Complaint Chief Complaint  Patient presents with  . Urinary Retention   Level V caveat for age  HPI Jeffrey Mccarty is a 83 y.o. male.  HPI  patient is very hard of hearing and most the history is obtained from his son-in-law. His sotalol states patient was seen on August 27 for nausea, vomiting, diarrhea and was admitted to the hospital, he was discharged on August 29. He told his son-in-law that yesterday, August 30 was first day he ate a regular meal. About 9 PM last night he started having difficulty urinating that got progressively worse. Son states his abdomen looks swollen. They deny nausea or vomiting tonight, patient denies being constipated. Patient has never had urinary difficulty before. Patient is on eliquis for chronic atrial fibrillation.  PCP [name], MD Cardiology Dr Wyline Mood  Past Medical History:  Diagnosis Date  . Atrial fibrillation (HCC)   . Elevated PSA   . Hypertension   . Hypothyroid   . Insomnia   . Kidney stone   . Polymyalgia rheumatica (HCC)   . Pressure ulcer   . Reflux     Patient Active Problem List   Diagnosis Date Noted  . AKI (acute kidney injury) (HCC) 09/24/2016  . Colitis 09/24/2016  . Gout 09/11/2016  . Hypothyroidism following radioiodine therapy 11/12/2014  . Allergic rhinitis 05/24/2013  . Malnutrition of moderate degree (HCC) 02/03/2013  . Esophageal reflux 02/01/2013  . Rash and nonspecific skin eruption 02/01/2013  . Atrial fibrillation with RVR (HCC) 01/31/2013  . URTI (acute upper respiratory infection) 01/31/2013  . ARF (acute renal failure) (HCC) 01/31/2013  . Essential hypertension 10/05/2008  . BILIARY DYSKINESIA 10/05/2008    Past Surgical History:  Procedure Laterality Date  . APPENDECTOMY    . CHOLECYSTECTOMY    . THYROIDECTOMY    . TONSILLECTOMY         Home  Medications    Prior to Admission medications   Medication Sig Start Date End Date Taking? Authorizing Provider  benazepril (LOTENSIN) 5 MG tablet Take 1 tablet by mouth daily. 09/15/16   [provider]  cephALEXin (KEFLEX) 500 MG capsule Take 1 capsule (500 mg total) by mouth 3 (three) times daily. 09/28/16   Devoria Albe, MD  ciprofloxacin (CIPRO) 250 MG tablet Take 1 tablet (250 mg total) by mouth 2 (two) times daily. 09/26/16   Houston Siren, MD  COLCRYS 0.6 MG tablet  09/12/16   [provider]  digoxin (DIGITEK) 0.125 MG tablet Take 1 tablet (125 mcg total) by mouth every other day. 05/28/16   [name], MD  ELIQUIS 2.5 MG TABS tablet take 1 tablet by mouth twice a day 05/14/16   Antoine Poche, MD  esomeprazole (NEXIUM) 40 MG capsule Take 1 capsule (40 mg total) by mouth daily. 05/28/16   [name], MD  feeding supplement, ENSURE COMPLETE, (ENSURE COMPLETE) LIQD Take 237 mLs by mouth 2 (two) times daily between meals. 02/03/13   Leroy Sea, MD  hydrochlorothiazide (MICROZIDE) 12.5 MG capsule Take 1 capsule by mouth daily. 09/15/16   [provider]  levothyroxine (SYNTHROID, LEVOTHROID) 75 MCG tablet Take 1 tablet (75 mcg total) by mouth daily before breakfast. 05/17/16   Nida, Denman George, MD  metroNIDAZOLE (FLAGYL) 500 MG tablet Take 1 tablet (500 mg total) by mouth every 8 (eight)  hours. 09/26/16   Houston Siren, MD  triamcinolone ointment (KENALOG) 0.1 % apply to affected area twice a day Patient taking differently: Apply 1 application topically 2 (two) times daily.  11/08/14   [name], MD    Family History Family History  Problem Relation Age of Onset  . Heart attack Father     Social History Social History  Substance Use Topics  . Smoking status: Never Smoker  . Smokeless tobacco: Never Used  . Alcohol use No  lives at home Lives alone [facility] without assistance    Allergies   Cardura [doxazosin mesylate]   Review of  Systems Review of Systems  All other systems reviewed and are negative.    Physical Exam Updated Vital Signs BP (!) 124/56 (BP Location: Right Arm)   Pulse (!) 48   Resp (!) 22   SpO2 98%   Vital signs normal except for bradycardia   Physical Exam  Constitutional: He is oriented to person, place, and time.  Non-toxic appearance. He does not appear ill. No distress.  Patient seen after Foley catheter was placed Patient is a frail elderly male  HENT:  Head: Normocephalic and atraumatic.  Right Ear: External ear normal.  Left Ear: External ear normal.  Nose: Nose normal. No mucosal edema or rhinorrhea.  Mouth/Throat: Oropharynx is clear and moist and mucous membranes are normal. No dental abscesses or uvula swelling.  Very hard of hearing  Eyes: Pupils are equal, round, and reactive to light. Conjunctivae and EOM are normal.  Neck: Normal range of motion and full passive range of motion without pain. Neck supple.  Cardiovascular: An irregularly irregular rhythm present. Bradycardia present.  Exam reveals no gallop and no friction rub.   No murmur heard. Pulmonary/Chest: Effort normal and breath sounds normal. No respiratory distress. He has no wheezes. He has no rhonchi. He has no rales. He exhibits no tenderness and no crepitus.  Abdominal: Soft. Normal appearance and bowel sounds are normal. He exhibits no distension. There is no tenderness. There is no rebound and no guarding.  Musculoskeletal: Normal range of motion. He exhibits no edema or tenderness.  Moves all extremities well.   Neurological: He is alert and oriented to person, place, and time. He has normal strength. No cranial nerve deficit.  Skin: Skin is warm, dry and intact. No rash noted. No erythema. No pallor.  Psychiatric: He has a normal mood and affect. His speech is normal and behavior is normal. His mood appears not anxious.  Nursing note and vitals reviewed.    ED Treatments / Results  Labs  Results for  orders placed or performed during the hospital encounter of 09/28/16  Urinalysis, Routine w reflex microscopic  Result Value Ref Range   Color, Urine AMBER (A) YELLOW   APPearance HAZY (A) CLEAR   Specific Gravity, Urine >1.030 (H) 1.005 - 1.030   pH 5.5 5.0 - 8.0   Glucose, UA NEGATIVE NEGATIVE mg/dL   Hgb urine dipstick LARGE (A) NEGATIVE   Bilirubin Urine NEGATIVE NEGATIVE   Ketones, ur NEGATIVE NEGATIVE mg/dL   Protein, ur 30 (A) NEGATIVE mg/dL   Nitrite POSITIVE (A) NEGATIVE   Leukocytes, UA NEGATIVE NEGATIVE  Urinalysis, Microscopic (reflex)  Result Value Ref Range   RBC / HPF 6-30 0 - 5 RBC/hpf   WBC, UA 0-5 0 - 5 WBC/hpf   Bacteria, UA MANY (A) NONE SEEN   Squamous Epithelial / LPF 0-5 (A) NONE SEEN   Laboratory interpretation all normal  except probable UTI     (all labs ordered are listed, but only abnormal results are displayed) Results for orders placed or performed during the hospital encounter of 09/24/16  Urinalysis, Routine w reflex microscopic  Result Value Ref Range   Color, Urine YELLOW YELLOW   APPearance CLEAR CLEAR   Specific Gravity, Urine 1.015 1.005 - 1.030   pH 5.0 5.0 - 8.0   Glucose, UA NEGATIVE NEGATIVE mg/dL   Hgb urine dipstick NEGATIVE NEGATIVE   Bilirubin Urine NEGATIVE NEGATIVE   Ketones, ur 5 (A) NEGATIVE mg/dL   Protein, ur 30 (A) NEGATIVE mg/dL   Nitrite NEGATIVE NEGATIVE   Leukocytes, UA NEGATIVE NEGATIVE   RBC / HPF 0-5 0 - 5 RBC/hpf   WBC, UA 6-30 0 - 5 WBC/hpf   Bacteria, UA RARE (A) NONE SEEN  Basic metabolic panel  Result Value Ref Range   Sodium 137 135 - 145 mmol/L   Potassium 3.4 (L) 3.5 - 5.1 mmol/L   Chloride 108 101 - 111 mmol/L   CO2 22 22 - 32 mmol/L   Glucose, Bld 78 65 - 99 mg/dL   BUN 23 (H) 6 - 20 mg/dL   Creatinine, Ser 6.96 0.61 - 1.24 mg/dL   Calcium 7.1 (L) 8.9 - 10.3 mg/dL   GFR calc non Af Amer 49 (L) >60 mL/min   GFR calc Af Amer 56 (L) >60 mL/min   Anion gap 7 5 - 15  CBC  Result Value Ref Range    WBC 5.8 4.0 - 10.5 K/uL   RBC 3.77 (L) 4.22 - 5.81 MIL/uL   Hemoglobin 11.8 (L) 13.0 - 17.0 g/dL   HCT 29.5 (L) 28.4 - 13.2 %   MCV 89.9 78.0 - 100.0 fL   MCH 31.3 26.0 - 34.0 pg   MCHC 34.8 30.0 - 36.0 g/dL   RDW 44.0 10.2 - 72.5 %   Platelets 228 150 - 400 K/uL   When patient was admitted to the hospital he had a renal insufficiency which improved with IV hydration.   EKG  EKG Interpretation None       Radiology No results found.   Ct Abdomen Pelvis Wo Contrast  Result Date: 09/24/2016 CLINICAL DATA:  Abdominal pain nausea vomiting and diarrhea Low-density lesions within the kidneys, likely representing cysts. Negative for hydronephrosis. Multiple bladder diverticula. IMPRESSION: 1. Negative for bowel obstruction 2. Suspect mild bowel wall thickening/colitis of the descending and sigmoid colon. This may be secondary to infection, inflammation, or less likely ischemia. 3. Stable 15 mm right adrenal gland adenoma 4. Markedly enlarged prostate gland as before 5. Stable marked compression fracture of L1 Electronically Signed   By: Jasmine Pang M.D.   On: 09/24/2016 21:02   Procedures Procedures (including critical care time)  Medications Ordered in ED Medications  cephALEXin (KEFLEX) capsule 500 mg (not administered)     Initial Impression / Assessment and Plan / ED Course  I have reviewed the triage vital signs and the nursing notes.  Pertinent labs & imaging results that were available during my care of the patient were reviewed by me and considered in my medical decision making (see chart for details).    Nurses report bladder scan was 776 mL. Foley catheter was placed and patient had immediate relief of his discomfort. Urinalysis was sent because the urine did have a blood tinge to it. Patient is on Eliquis, however, he just had an abdominal CT scan without acute lesions of the GU tract described except  for some renal cysts.   After reviewing his urinalysis patient was  started on oral Keflex. Urine culture was sent. Patient will need to follow-up with urology next week.  Final Clinical Impressions(s) / ED Diagnoses   Final diagnoses:  Urinary retention  Acute cystitis with hematuria    New Prescriptions New Prescriptions   CEPHALEXIN (KEFLEX) 500 MG CAPSULE    Take 1 capsule (500 mg total) by mouth 3 (three) times daily.    Plan discharge  Devoria AlbeIva Shaquia Berkley, MD, Concha PyoFACEP    Tishawn Friedhoff, MD 09/28/16 [phone]

## 2016-09-28 NOTE — Discharge Instructions (Signed)
Drink plenty of fluids. Take the antibiotics until gone. Call [facility] today to get an appointment to be rechecked next week. Look at the instructions for the care of the catheter.  Return to the ED if you get a fever, vomiting, or the catheter stops draining again.

## 2016-09-28 NOTE — ED Triage Notes (Signed)
Pt unable to urinate since last night

## 2016-09-29 LAB — URINE CULTURE
CULTURE: NO GROWTH
Special Requests: NORMAL

## 2016-10-03 ENCOUNTER — Encounter: Payer: Self-pay | Admitting: Family Medicine

## 2016-10-03 ENCOUNTER — Ambulatory Visit (INDEPENDENT_AMBULATORY_CARE_PROVIDER_SITE_OTHER): Payer: Medicare HMO | Admitting: Family Medicine

## 2016-10-03 VITALS — BP 120/74 | Ht 61.0 in | Wt 100.0 lb

## 2016-10-03 DIAGNOSIS — K529 Noninfective gastroenteritis and colitis, unspecified: Secondary | ICD-10-CM

## 2016-10-03 DIAGNOSIS — I10 Essential (primary) hypertension: Secondary | ICD-10-CM | POA: Diagnosis not present

## 2016-10-03 NOTE — Progress Notes (Signed)
   Subjective:    Patient ID: Jeffrey Mccarty, male    DOB: 09/18/1921, 83 y.o.   MRN: [id number]  HPI Patient is here today for a follow up on hospitalization on urinary retention.   Complete hospital record reviewed in presence of patient. Was initially hospitalized for gastroenteritis. At that time blood pressure was quite low. Current antihypertensive medicines were held.  Patient returns emergency room with urinary tract obstruction. Catheter was placed. Family somewhat compromised and thought that the ER weighted set up visit with urologist. ER notes appears to expect family to call and set this up.  Patient extremely hard of hearing but no acute complaints today  Review of Systems No headache, no major weight loss or weight gain, no chest pain no back pain abdominal pain no change in bowel habits complete ROS otherwise negative     Objective:   Physical Exam Blood pressure 108/72 on repeat similar both arms Alert and oriented, vitals reviewed and stable, NAD ENT-TM's and ext canals WNL bilat via otoscopic exam Soft palate, tonsils and post pharynx WNL via oropharyngeal exam Neck-symmetric, no masses; thyroid nonpalpable and nontender Pulmonary-no tachypnea or accessory muscle use; Clear without wheezes via auscultation Card--no abnrml murmurs, rhythm reg and rate WNL Carotid pulses symmetric, without bruits Catheter present abdominal exam benign      Assessment & Plan:  Impression 1 urinary outlet obstruction. Needs to see a urologist. I put a nurse on this and it took a full 40 minutes to schedule  #2 status post hospitalization for gastroenteritis colitis and temporary renal insufficiency. Follow-up met 7 revealed resolution. Blood pressure still low so will go ahead and hold off on blood pressure meds for now. Discussed with family.  Medications clarified and follow-up in 2 months

## 2016-10-05 ENCOUNTER — Ambulatory Visit (INDEPENDENT_AMBULATORY_CARE_PROVIDER_SITE_OTHER): Payer: Medicare HMO | Admitting: Urology

## 2016-10-05 DIAGNOSIS — R972 Elevated prostate specific antigen [PSA]: Secondary | ICD-10-CM | POA: Diagnosis not present

## 2016-10-09 ENCOUNTER — Telehealth: Payer: Self-pay | Admitting: Family Medicine

## 2016-10-09 NOTE — Telephone Encounter (Signed)
I'm sorry do not understand the question

## 2016-10-09 NOTE — Telephone Encounter (Signed)
That is the urologists call, as long as pt is urinating they have the right to delegate to their nurses the removal of a catheter.   If the family would like something done about the periph edema, rec f u visit with me to disc because all options have potential sig side effects

## 2016-10-09 NOTE — Telephone Encounter (Signed)
Patient seen a nurse at [facility] on 10/05/16 and his catheter was removed from 10:00 am to 2:00.  The doctor was unavailable that day.  [name] said that the doctor at [facility] could not see him until October 9th.  His feet are swelling really bad and [name] wants to know if there is anything that [facility] can do about this, since that was the nurse when he saw Dr. Lorin PicketScott the other day.

## 2016-10-09 NOTE — Telephone Encounter (Signed)
[facility] stated he was being worked in with the NP- doctors are booked out much longer than the extenders who do most work ins?

## 2016-10-09 NOTE — Telephone Encounter (Signed)
Patient didn't get to see the Urologist the other day he saw the NP and she removed the catheter- the urologist can't see him until 11/06/16 and they want us to call and get him see sooner by the doctor-not nurse because he is still having a lot of problems

## 2016-10-10 ENCOUNTER — Ambulatory Visit: Payer: Medicare HMO | Admitting: Nurse Practitioner

## 2016-10-10 NOTE — Telephone Encounter (Signed)
Spoke with patient's daughter and informed her per Dr.Steve Luking- That is the urologists call, as long as pt is urinating they have the right to delegate to their nurses the removal of a catheter. If the family would like something done about the peripheral edema, recommend follow up visit with Dr.Steve to discuss because all options have potential significant side effects. Patient's daughter verbalized understanding.

## 2016-10-11 ENCOUNTER — Encounter: Payer: Self-pay | Admitting: Family Medicine

## 2016-10-11 ENCOUNTER — Ambulatory Visit (INDEPENDENT_AMBULATORY_CARE_PROVIDER_SITE_OTHER): Payer: Medicare HMO | Admitting: Family Medicine

## 2016-10-11 VITALS — BP 126/70 | Wt 104.0 lb

## 2016-10-11 DIAGNOSIS — Z23 Encounter for immunization: Secondary | ICD-10-CM | POA: Diagnosis not present

## 2016-10-11 DIAGNOSIS — I878 Other specified disorders of veins: Secondary | ICD-10-CM | POA: Diagnosis not present

## 2016-10-11 MED ORDER — FUROSEMIDE 20 MG PO TABS: 20.0000 mg | ORAL_TABLET | Freq: Every day | ORAL | 11 refills | Status: DC

## 2016-10-11 MED ORDER — POTASSIUM CHLORIDE ER 20 MEQ PO TBCR: EXTENDED_RELEASE_TABLET | ORAL | 11 refills | Status: DC

## 2016-10-11 NOTE — Progress Notes (Signed)
   Subjective:    Patient ID: Jeffrey Mccarty, male    DOB: 1921-05-09, 83 y.o.   MRN: [id number]  HPI Patient in today for peripheral edema. Feet and ankles swollen. Non pitting edema.    Please see prior note.  Patient's renal function had returned to normal at last blood work. Substantial swollen feet and ankles. Uncomfortable to the patient.  Does eat some excessive salt.  Patient very hard of hearing so challenging historian   States no other concerns this visit.      Review of Systems No headache, no major weight loss or weight gain, no chest pain no back pain abdominal pain no change in bowel habits complete ROS otherwise negative     Objective:   Physical Exam   Alert vitals stable, NAD. Blood pressure good on repeat. HEENT normal. Lungs clear. Heart regular rate and rhythm. Lungs completely clear heart rhythm normal ankles 1+ pitting edema feet 1+ pitting edema bilateral     Assessment & Plan:  Impression substantial peripheral edema, likely venous stasis perhaps small element from borderline low albumin discussed with family. No substantial renal dysfunction at this point plan initiate lipid diuretic plus potassium rationale discussed cut down salt intake follow-up as scheduled

## 2016-10-17 ENCOUNTER — Telehealth: Payer: Self-pay | Admitting: Family Medicine

## 2016-10-17 NOTE — Telephone Encounter (Signed)
In this situation Dr. Brett Canales is much more familiar with this patient-I will refer this message to him which will be handled on Thursday

## 2016-10-17 NOTE — Telephone Encounter (Signed)
Daughter calling about a couple issues, problems sleeping and problems with using bathroom.  Wanted to get some advice from the nurse. She is at his house today so you can call her at house # [phone], or her cell# [phone].

## 2016-10-17 NOTE — Telephone Encounter (Signed)
Spoke with patient's daughter and informed her that Dr.Steve Luking- Recommends Melatonin 5 mg one at bedtime  As needed for sleep. Patient's daughter verbalized understanding.

## 2016-10-17 NOTE — Telephone Encounter (Signed)
Patient daughter called stating that patient is having difficulty resting at night and battling with constipation. Patient's daughter would like to know if a sleep aid is recommended over the counter. Please advise?

## 2016-10-17 NOTE — Telephone Encounter (Signed)
Prescription stringth sleep med I agree woud be too strong with his situaion, melatonin  otc one qhs prn sleep

## 2016-11-04 ENCOUNTER — Other Ambulatory Visit: Payer: Self-pay | Admitting: Cardiology

## 2016-11-06 ENCOUNTER — Ambulatory Visit (INDEPENDENT_AMBULATORY_CARE_PROVIDER_SITE_OTHER): Payer: Medicare HMO | Admitting: Urology

## 2016-11-06 DIAGNOSIS — N401 Enlarged prostate with lower urinary tract symptoms: Secondary | ICD-10-CM

## 2016-11-07 ENCOUNTER — Ambulatory Visit (INDEPENDENT_AMBULATORY_CARE_PROVIDER_SITE_OTHER): Payer: Medicare HMO | Admitting: Family Medicine

## 2016-11-07 ENCOUNTER — Encounter: Payer: Self-pay | Admitting: Family Medicine

## 2016-11-07 VITALS — BP 112/66 | Ht 61.0 in | Wt 100.5 lb

## 2016-11-07 DIAGNOSIS — I878 Other specified disorders of veins: Secondary | ICD-10-CM | POA: Diagnosis not present

## 2016-11-07 DIAGNOSIS — K219 Gastro-esophageal reflux disease without esophagitis: Secondary | ICD-10-CM

## 2016-11-07 DIAGNOSIS — I10 Essential (primary) hypertension: Secondary | ICD-10-CM | POA: Diagnosis not present

## 2016-11-07 DIAGNOSIS — I4891 Unspecified atrial fibrillation: Secondary | ICD-10-CM | POA: Diagnosis not present

## 2016-11-07 MED ORDER — TAMSULOSIN HCL 0.4 MG PO CAPS: 0.4000 mg | ORAL_CAPSULE | Freq: Every day | ORAL | 6 refills | Status: DC

## 2016-11-07 NOTE — Progress Notes (Signed)
   Subjective:    Patient ID: Jeffrey Mccarty, male    DOB: [birth year], 83 y.o.   MRN: [id number]  Hypertension  This is a chronic problem. The current episode started more than 1 year ago.   Patient would also like to discuss edema to lower extremity . Has been more so lately though now improved. Review of chart reveals patient has long-standing history of venous stasis  Blood pressure medicine and blood pressure levels reviewed today with patient. Compliant with blood pressure medicine. States does not miss a dose. No obvious side effects. Blood pressure generally good when checked elsewhere. Watching salt intake.   Patient has hypothyroidism followed by Dr. Fransico Him.  Patient also has atrial fibrillation. Stable.  Challenging difficulties lately with urinary retention. Having to wear a catheter for now. Family frustrated because they said urologist was going to prescribe generic Flomax and did not. Requests prescription   Review of Systems No headache, no major weight loss or weight gain, no chest pain no back pain abdominal pain no change in bowel habits complete ROS otherwise negative     Objective:   Physical Exam  Alert and oriented, vitals reviewed and stable, NAD ENT-TM's and ext canals WNL bilat via otoscopic exam Soft palate, tonsils and post pharynx WNL via oropharyngeal exam Neck-symmetric, no masses; thyroid nonpalpable and nontender Pulmonary-no tachypnea or accessory muscle use; Clear without wheezes via auscultation Card--no abnrml murmurs, rhythm reg and rateConsistent with controlled A. fib Carotid pulses symmetric, without bruits Ankles trace to 1+ edema bilateral      Assessment & Plan:  Impression 1 hypertension good control discussed maintain same  #2 venous stasis have advised family that patient will have some edema and ankles: For.  #3 urinary concerns. Discussed at great length by daughter  Plan add Flomax. Rationale discussed. Maintain other  medications. Up to date on vaccinations. Follow-up in 6 months. WSL

## 2016-11-13 ENCOUNTER — Ambulatory Visit (INDEPENDENT_AMBULATORY_CARE_PROVIDER_SITE_OTHER): Payer: Medicare HMO | Admitting: Cardiology

## 2016-11-13 ENCOUNTER — Encounter: Payer: Self-pay | Admitting: Cardiology

## 2016-11-13 ENCOUNTER — Ambulatory Visit (INDEPENDENT_AMBULATORY_CARE_PROVIDER_SITE_OTHER): Payer: Medicare HMO | Admitting: Urology

## 2016-11-13 VITALS — BP 102/58 | HR 77 | Ht 61.0 in | Wt 103.0 lb

## 2016-11-13 DIAGNOSIS — I4891 Unspecified atrial fibrillation: Secondary | ICD-10-CM | POA: Diagnosis not present

## 2016-11-13 DIAGNOSIS — I519 Heart disease, unspecified: Secondary | ICD-10-CM | POA: Diagnosis not present

## 2016-11-13 DIAGNOSIS — N401 Enlarged prostate with lower urinary tract symptoms: Secondary | ICD-10-CM | POA: Diagnosis not present

## 2016-11-13 DIAGNOSIS — I350 Nonrheumatic aortic (valve) stenosis: Secondary | ICD-10-CM

## 2016-11-13 DIAGNOSIS — I5189 Other ill-defined heart diseases: Secondary | ICD-10-CM

## 2016-11-13 DIAGNOSIS — I10 Essential (primary) hypertension: Secondary | ICD-10-CM

## 2016-11-13 NOTE — Patient Instructions (Signed)
Medication Instructions:  Your physician recommends that you continue on your current medications as directed. Please refer to the Current Medication list given to you today.   Labwork: NONE  Testing/Procedures: NONE  Follow-Up: Your physician recommends that you schedule a follow-up appointment in: 3 MONTHS    Any Other Special Instructions Will Be Listed Below (If Applicable).     If you need a refill on your cardiac medications before your next appointment, please call your pharmacy.   

## 2016-11-13 NOTE — Progress Notes (Signed)
Clinical Summary Jeffrey Mccarty is a 83 y.o.male seen today for follow up of the following medical problems.   1. Afib  - blood pressure has had difficulty tolerating beta blockers or CCBs during prior admission, he has actually been only on dig 0.125 mg every other day and doing well for quite some time - CHADS2 score of 3, he is on eliquis - he remains active. He enjoys [place] barn" to listen to country music.   - no recent palpitations, no bleeding troubles on anticoagulation.   2. HTN  - he remains compliant with meds.   3. Mild to moderate aortic stenosis  - 04/2015 echo moderate AS. AVA VTI 1.14, mean grad 25 - poorly visuazlied on most recent echo, mean gradient of 21 mmHg suggestive of moderate but technically limited.   - no SOB, no chest pain, no dizziness.  - wants to wait on repeat echo.    4. Diastolic dysfunction  - grade 2 on most recent echo  - no recent symptoms.    5. Urinary retention - biggest issue recently, has had intermittent foley catheters. Can develop some fluid overload with leg swelling and SOB that resolves with foley placement - followed by urology.    Past Medical History:  Diagnosis Date  . Atrial fibrillation (HCC)   . Elevated PSA   . Hypertension   . Hypothyroid   . Insomnia   . Kidney stone   . Polymyalgia rheumatica (HCC)   . Pressure ulcer   . Reflux      Allergies  Allergen Reactions  . Cardura [Doxazosin Mesylate]      Current Outpatient Prescriptions  Medication Sig Dispense Refill  . digoxin (DIGITEK) 0.125 MG tablet Take 1 tablet (125 mcg total) by mouth every other day. 15 tablet 5  . ELIQUIS 2.5 MG TABS tablet take 1 tablet by mouth twice a day 90 tablet 3  . esomeprazole (NEXIUM) 40 MG capsule Take 1 capsule (40 mg total) by mouth daily. 90 capsule 1  . feeding supplement, ENSURE COMPLETE, (ENSURE COMPLETE) LIQD Take 237 mLs by mouth 2 (two) times daily between meals. 30 Bottle 1  .  furosemide (LASIX) 20 MG tablet Take 1 tablet (20 mg total) by mouth daily. 30 tablet 11  . levothyroxine (SYNTHROID, LEVOTHROID) 75 MCG tablet Take 1 tablet (75 mcg total) by mouth daily before breakfast. 30 tablet 12  . metroNIDAZOLE (FLAGYL) 500 MG tablet Take 1 tablet (500 mg total) by mouth every 8 (eight) hours. 15 tablet 0  . Potassium Chloride ER (K-TAB) 20 MEQ TBCR One p o qam 30 tablet 11  . tamsulosin (FLOMAX) 0.4 MG CAPS capsule Take 1 capsule (0.4 mg total) by mouth daily. 30 capsule 6  . triamcinolone ointment (KENALOG) 0.1 % apply to affected area twice a day (Patient taking differently: Apply 1 application topically 2 (two) times daily. ) 60 g 3   No current facility-administered medications for this visit.      Past Surgical History:  Procedure Laterality Date  . APPENDECTOMY    . CHOLECYSTECTOMY    . THYROIDECTOMY    . TONSILLECTOMY       Allergies  Allergen Reactions  . Cardura [Doxazosin Mesylate]       Family History  Problem Relation Age of Onset  . Heart attack Father      Social History Mr. Girvan reports that he has never smoked. He has never used smokeless tobacco. Mr. Pokorski reports that  he does not drink alcohol.   Review of Systems CONSTITUTIONAL: No weight loss, fever, chills, weakness or fatigue.  HEENT: Eyes: No visual loss, blurred vision, double vision or yellow sclerae.No hearing loss, sneezing, congestion, runny nose or sore throat.  SKIN: No rash or itching.  CARDIOVASCULAR: per hpi RESPIRATORY: pe rhpi  GASTROINTESTINAL: No anorexia, nausea, vomiting or diarrhea. No abdominal pain or blood.  GENITOURINARY: +urinary retention NEUROLOGICAL: No headache, dizziness, syncope, paralysis, ataxia, numbness or tingling in the extremities. No change in bowel or bladder control.  MUSCULOSKELETAL: No muscle, back pain, joint pain or stiffness.  LYMPHATICS: No enlarged nodes. No history of splenectomy.  PSYCHIATRIC: No history of depression  or anxiety.  ENDOCRINOLOGIC: No reports of sweating, cold or heat intolerance. No polyuria or polydipsia.  Marland Kitchen   Physical Examination Vitals:   11/13/16 1110  BP: (!) 102/58  Pulse: 77  SpO2: 96%   Vitals:   11/13/16 1110  Weight: 103 lb (46.7 kg)  Height:  (1.549 m)    Gen: resting comfortably, no acute distress HEENT: no scleral icterus, pupils equal round and reactive, no palptable cervical adenopathy,  CV: RRR, 3/6 systolic murmur rusb, no jvd Resp: Clear to auscultation bilaterally GI: abdomen is soft, non-tender, non-distended, normal bowel sounds, no hepatosplenomegaly MSK: extremities are warm, no edema.  Skin: warm, no rash Neuro:  no focal deficits Psych: appropriate affect   Diagnostic Studies 01/2013 Echo LVEF 55-60%, akinesis basalinferolateral myocardium, grade II diastolic dysfunction,    04/2015 echo Study Conclusions  - Left ventricle: The cavity size was normal. There was focal basal hypertrophy. Systolic function was normal. The estimated ejection fraction was in the range of 55% to 60%. Doppler parameters are consistent with abnormal left ventricular relaxation (grade 1 diastolic dysfunction). - Aortic valve: Moderately calcified annulus. Trileaflet; moderately thickened leaflets. There was moderate stenosis. There was mild to moderate regurgitation. Valve area (VTI): 1.14 cm^2. Valve area (Vmax): 1.18 cm^2. Valve area (Vmean): 0.88 cm^2. - Mitral valve: There was mild to moderate regurgitation. - Left atrium: The atrium was severely dilated. - Right atrium: The atrium was moderately dilated. - Technically adequate study.    Assessment and Plan  1. Afib  - He has been managed on every other day digoxin due to soft bp's on beta blockers and CCBs.  - CHADS2Vasc score is 3, he will continue anticoagulation  - no recent symptoms, continue current meds  2. HTN  - bp at goal, continue current meds  3. Aortic  stenosis  - he would like to hold off on repeat echo due to ongoing urinary retention issues, likely order after next visit - no recent symptoms.   4. Diastolic dysfunction  - no recent symptoms, continue current meds. Intermittent fluid overload seems to be related to urinary retention, resolves with foley placement    F/u 3 months      Antoine Poche, M.D.

## 2016-11-27 ENCOUNTER — Ambulatory Visit: Payer: Medicare HMO | Admitting: Family Medicine

## 2016-12-08 ENCOUNTER — Other Ambulatory Visit: Payer: Self-pay | Admitting: Family Medicine

## 2016-12-10 ENCOUNTER — Telehealth: Payer: Self-pay | Admitting: Family Medicine

## 2016-12-10 NOTE — Telephone Encounter (Signed)
Last seen 11/07/16

## 2016-12-10 NOTE — Telephone Encounter (Signed)
Pt is needing something called in for the bed sores that are on both sides of his hip. One side is bleeding. Can a cream be called in for this. Please advise.      [facility]

## 2016-12-11 ENCOUNTER — Other Ambulatory Visit: Payer: Self-pay | Admitting: Family Medicine

## 2016-12-11 MED ORDER — MUPIROCIN 2 % EX OINT: TOPICAL_OINTMENT | CUTANEOUS | 6 refills | Status: DC

## 2016-12-11 NOTE — Telephone Encounter (Signed)
Spoke with patient's daughter and informed her per Dr.Steve Luking- we are sending in bactroban ointment to apply to affected area. Patient verbalized understanding.

## 2016-12-11 NOTE — Telephone Encounter (Signed)
bactroban oint 60 g bid affected area thin layer 6 ref

## 2016-12-18 ENCOUNTER — Ambulatory Visit (INDEPENDENT_AMBULATORY_CARE_PROVIDER_SITE_OTHER): Payer: Medicare HMO | Admitting: Urology

## 2016-12-18 DIAGNOSIS — N401 Enlarged prostate with lower urinary tract symptoms: Secondary | ICD-10-CM | POA: Diagnosis not present

## 2016-12-28 ENCOUNTER — Telehealth: Payer: Self-pay | Admitting: Family Medicine

## 2016-12-28 DIAGNOSIS — R109 Unspecified abdominal pain: Secondary | ICD-10-CM

## 2016-12-28 NOTE — Telephone Encounter (Signed)
Referral put in. Pt's daughter [name] notified.

## 2016-12-28 NOTE — Telephone Encounter (Signed)
Pt's daughter Jeffrey Leash(Donna) called requesting a referral for pt to Dr. Karilyn Cotaehman  He's seen Dr. Karilyn Cotaehman before but it's been a while & now needs a referral  Pt with abdominal pain, lots of gas, diarrhea for a while then will be constipated Daughter isn't sure but thinks pt is taking Metamucil without anyone knowing for sure Daughter also states that pt is on Nexium  Refer or NTBS?  Please advise  (please initiate referral in system so I may process if not needing to be seen)

## 2016-12-28 NOTE — Telephone Encounter (Signed)
Let's do 

## 2016-12-31 ENCOUNTER — Encounter (HOSPITAL_COMMUNITY): Payer: Self-pay

## 2016-12-31 ENCOUNTER — Other Ambulatory Visit: Payer: Self-pay | Admitting: Family Medicine

## 2016-12-31 ENCOUNTER — Other Ambulatory Visit: Payer: Self-pay

## 2016-12-31 ENCOUNTER — Emergency Department (HOSPITAL_COMMUNITY): Payer: Medicare HMO

## 2016-12-31 ENCOUNTER — Observation Stay (HOSPITAL_COMMUNITY)
Admission: EM | Admit: 2016-12-31 | Discharge: 2017-01-01 | Disposition: A | Discharge status: Home / Self Care | Payer: Medicare HMO | Attending: Internal Medicine | Admitting: Internal Medicine

## 2016-12-31 DIAGNOSIS — I482 Chronic atrial fibrillation, unspecified: Secondary | ICD-10-CM | POA: Diagnosis present

## 2016-12-31 DIAGNOSIS — R2681 Unsteadiness on feet: Secondary | ICD-10-CM | POA: Insufficient documentation

## 2016-12-31 DIAGNOSIS — E890 Postprocedural hypothyroidism: Secondary | ICD-10-CM | POA: Diagnosis present

## 2016-12-31 DIAGNOSIS — M6281 Muscle weakness (generalized): Secondary | ICD-10-CM | POA: Diagnosis not present

## 2016-12-31 DIAGNOSIS — I110 Hypertensive heart disease with heart failure: Secondary | ICD-10-CM | POA: Insufficient documentation

## 2016-12-31 DIAGNOSIS — N3000 Acute cystitis without hematuria: Secondary | ICD-10-CM | POA: Diagnosis not present

## 2016-12-31 DIAGNOSIS — Z79899 Other long term (current) drug therapy: Secondary | ICD-10-CM | POA: Diagnosis not present

## 2016-12-31 DIAGNOSIS — I4891 Unspecified atrial fibrillation: Secondary | ICD-10-CM | POA: Diagnosis not present

## 2016-12-31 DIAGNOSIS — R1084 Generalized abdominal pain: Secondary | ICD-10-CM | POA: Diagnosis not present

## 2016-12-31 DIAGNOSIS — I509 Heart failure, unspecified: Secondary | ICD-10-CM | POA: Diagnosis present

## 2016-12-31 DIAGNOSIS — I5022 Chronic systolic (congestive) heart failure: Secondary | ICD-10-CM | POA: Insufficient documentation

## 2016-12-31 DIAGNOSIS — R410 Disorientation, unspecified: Secondary | ICD-10-CM | POA: Diagnosis not present

## 2016-12-31 DIAGNOSIS — R339 Retention of urine, unspecified: Secondary | ICD-10-CM | POA: Diagnosis not present

## 2016-12-31 DIAGNOSIS — R0682 Tachypnea, not elsewhere classified: Secondary | ICD-10-CM | POA: Diagnosis not present

## 2016-12-31 DIAGNOSIS — R109 Unspecified abdominal pain: Secondary | ICD-10-CM | POA: Diagnosis not present

## 2016-12-31 DIAGNOSIS — E039 Hypothyroidism, unspecified: Secondary | ICD-10-CM | POA: Diagnosis not present

## 2016-12-31 DIAGNOSIS — F0390 Unspecified dementia without behavioral disturbance: Secondary | ICD-10-CM | POA: Diagnosis not present

## 2016-12-31 DIAGNOSIS — I5030 Unspecified diastolic (congestive) heart failure: Secondary | ICD-10-CM | POA: Diagnosis not present

## 2016-12-31 DIAGNOSIS — Z7901 Long term (current) use of anticoagulants: Secondary | ICD-10-CM

## 2016-12-31 DIAGNOSIS — N390 Urinary tract infection, site not specified: Principal | ICD-10-CM | POA: Diagnosis present

## 2016-12-31 DIAGNOSIS — R531 Weakness: Secondary | ICD-10-CM | POA: Diagnosis not present

## 2016-12-31 LAB — URINALYSIS, ROUTINE W REFLEX MICROSCOPIC
Bilirubin Urine: NEGATIVE
Glucose, UA: NEGATIVE mg/dL
KETONES UR: NEGATIVE mg/dL
Nitrite: POSITIVE — AB
PROTEIN: NEGATIVE mg/dL
SQUAMOUS EPITHELIAL / LPF: NONE SEEN
Specific Gravity, Urine: 1.006 (ref 1.005–1.030)
pH: 6 (ref 5.0–8.0)

## 2016-12-31 LAB — COMPREHENSIVE METABOLIC PANEL
ALK PHOS: 54 U/L (ref 38–126)
ALT: 21 U/L (ref 17–63)
AST: 33 U/L (ref 15–41)
Albumin: 3.6 g/dL (ref 3.5–5.0)
Anion gap: 9 (ref 5–15)
BUN: 36 mg/dL — AB (ref 6–20)
CALCIUM: 9.3 mg/dL (ref 8.9–10.3)
CO2: 25 mmol/L (ref 22–32)
CREATININE: 1.42 mg/dL — AB (ref 0.61–1.24)
Chloride: 106 mmol/L (ref 101–111)
GFR calc non Af Amer: 41 mL/min — ABNORMAL LOW (ref >60)
GFR, EST AFRICAN AMERICAN: 48 mL/min — AB (ref >60)
Glucose, Bld: 95 mg/dL (ref 65–99)
Potassium: 4.1 mmol/L (ref 3.5–5.1)
SODIUM: 140 mmol/L (ref 135–145)
Total Bilirubin: 0.6 mg/dL (ref 0.3–1.2)
Total Protein: 7.5 g/dL (ref 6.5–8.1)

## 2016-12-31 LAB — CBC WITH DIFFERENTIAL/PLATELET
Basophils Absolute: 0.1 10*3/uL (ref 0.0–0.1)
Basophils Relative: 1 %
EOS ABS: 0.2 10*3/uL (ref 0.0–0.7)
Eosinophils Relative: 3 %
HEMATOCRIT: 34.2 % — AB (ref 39.0–52.0)
HEMOGLOBIN: 11 g/dL — AB (ref 13.0–17.0)
LYMPHS ABS: 1.5 10*3/uL (ref 0.7–4.0)
LYMPHS PCT: 23 %
MCH: 28.5 pg (ref 26.0–34.0)
MCHC: 32.2 g/dL (ref 30.0–36.0)
MCV: 88.6 fL (ref 78.0–100.0)
MONOS PCT: 9 %
Monocytes Absolute: 0.6 10*3/uL (ref 0.1–1.0)
NEUTROS ABS: 4.3 10*3/uL (ref 1.7–7.7)
NEUTROS PCT: 64 %
Platelets: 295 10*3/uL (ref 150–400)
RBC: 3.86 MIL/uL — ABNORMAL LOW (ref 4.22–5.81)
RDW: 14.8 % (ref 11.5–15.5)
WBC: 6.6 10*3/uL (ref 4.0–10.5)

## 2016-12-31 LAB — LIPASE, BLOOD: Lipase: 32 U/L (ref 11–51)

## 2016-12-31 LAB — TSH: TSH: 5.232 u[IU]/mL — ABNORMAL HIGH (ref 0.350–4.500)

## 2016-12-31 LAB — TROPONIN I
TROPONIN I: 0.04 ng/mL — AB (ref <0.03)
Troponin I: 0.04 ng/mL (ref <0.03)

## 2016-12-31 LAB — LACTIC ACID, PLASMA
Lactic Acid, Venous: 0.8 mmol/L (ref 0.5–1.9)
Lactic Acid, Venous: 1 mmol/L (ref 0.5–1.9)

## 2016-12-31 LAB — DIGOXIN LEVEL: DIGOXIN LVL: 0.7 ng/mL — AB (ref 0.8–2.0)

## 2016-12-31 LAB — BRAIN NATRIURETIC PEPTIDE: B Natriuretic Peptide: 488 pg/mL — ABNORMAL HIGH (ref 0.0–100.0)

## 2016-12-31 MED ORDER — DEXTROSE 5 % IV SOLN
1.0000 g | INTRAVENOUS | Status: DC
  Filled 2016-12-31: qty 10

## 2016-12-31 MED ORDER — TAMSULOSIN HCL 0.4 MG PO CAPS
0.4000 mg | ORAL_CAPSULE | Freq: Every day | ORAL | Status: DC
  Administered 2016-12-31 – 2017-01-01 (×2): 0.4 mg via ORAL
  Filled 2016-12-31 (×2): qty 1

## 2016-12-31 MED ORDER — HYDROGEN PEROXIDE 3 % EX SOLN
CUTANEOUS | Status: AC
  Filled 2016-12-31: qty 473

## 2016-12-31 MED ORDER — ACETAMINOPHEN 650 MG RE SUPP: 650.0000 mg | Freq: Four times a day (QID) | RECTAL | Status: DC | PRN

## 2016-12-31 MED ORDER — SODIUM CHLORIDE 0.9 % IV SOLN
INTRAVENOUS | Status: DC
  Administered 2016-12-31: 17:00:00 via INTRAVENOUS

## 2016-12-31 MED ORDER — PSYLLIUM 95 % PO PACK
1.0000 | PACK | Freq: Every day | ORAL | Status: DC | PRN
  Filled 2016-12-31: qty 1

## 2016-12-31 MED ORDER — DOCUSATE SODIUM 100 MG PO CAPS
100.0000 mg | ORAL_CAPSULE | Freq: Two times a day (BID) | ORAL | Status: DC
  Administered 2016-12-31 – 2017-01-01 (×2): 100 mg via ORAL
  Filled 2016-12-31 (×2): qty 1

## 2016-12-31 MED ORDER — ONDANSETRON HCL 4 MG PO TABS: 4.0000 mg | ORAL_TABLET | Freq: Four times a day (QID) | ORAL | Status: DC | PRN

## 2016-12-31 MED ORDER — DEXTROSE 5 % IV SOLN
1.0000 g | Freq: Once | INTRAVENOUS | Status: AC
  Administered 2016-12-31: 1 g via INTRAVENOUS
  Filled 2016-12-31: qty 10

## 2016-12-31 MED ORDER — DIPHENHYDRAMINE HCL 12.5 MG/5ML PO ELIX
12.5000 mg | ORAL_SOLUTION | Freq: Once | ORAL | Status: AC | PRN
  Administered 2016-12-31: 12.5 mg via ORAL
  Filled 2016-12-31: qty 5

## 2016-12-31 MED ORDER — FUROSEMIDE 20 MG PO TABS
20.0000 mg | ORAL_TABLET | Freq: Every day | ORAL | Status: DC
  Administered 2016-12-31 – 2017-01-01 (×2): 20 mg via ORAL
  Filled 2016-12-31 (×2): qty 1

## 2016-12-31 MED ORDER — LEVOTHYROXINE SODIUM 75 MCG PO TABS
75.0000 ug | ORAL_TABLET | Freq: Every day | ORAL | Status: DC
  Administered 2017-01-01: 75 ug via ORAL
  Filled 2016-12-31: qty 1

## 2016-12-31 MED ORDER — ACETAMINOPHEN 325 MG PO TABS: 650.0000 mg | ORAL_TABLET | Freq: Four times a day (QID) | ORAL | Status: DC | PRN

## 2016-12-31 MED ORDER — DIGOXIN 125 MCG PO TABS: 125.0000 ug | ORAL_TABLET | ORAL | Status: DC

## 2016-12-31 MED ORDER — SODIUM CHLORIDE 0.9 % IV BOLUS (SEPSIS): 250.0000 mL | Freq: Once | INTRAVENOUS | Status: DC

## 2016-12-31 MED ORDER — PANTOPRAZOLE SODIUM 40 MG PO TBEC
40.0000 mg | DELAYED_RELEASE_TABLET | Freq: Every day | ORAL | Status: DC
  Administered 2017-01-01: 40 mg via ORAL
  Filled 2016-12-31: qty 1

## 2016-12-31 MED ORDER — ONDANSETRON HCL 4 MG/2ML IJ SOLN: 4.0000 mg | Freq: Four times a day (QID) | INTRAMUSCULAR | Status: DC | PRN

## 2016-12-31 MED ORDER — APIXABAN 2.5 MG PO TABS
2.5000 mg | ORAL_TABLET | Freq: Two times a day (BID) | ORAL | Status: DC
  Administered 2016-12-31 – 2017-01-01 (×2): 2.5 mg via ORAL
  Filled 2016-12-31 (×2): qty 1

## 2016-12-31 NOTE — ED Notes (Signed)
Pt has chronic foley which was replaced in November. Pt urine smells foul. Family states increased in smell this week. Pt more lethargic and wanting to sleep more than usually per family. Pt is extremely hard of hearing. Yelling in left ear.

## 2016-12-31 NOTE — ED Triage Notes (Signed)
EMS reports pt c/o abd pain since this morning.  Reports is on antibiotics for UTI.  Reports has indwelling foley.  Pt alert and oriented.  Denies n/v.  Daughter told ems pt has not been confused except for this morning he had some difficulty using his microwave that he uses every day.  Pt c/o generalized weakness.

## 2016-12-31 NOTE — H&P (Signed)
History and Physical    Jeffrey Mccarty UJW:[id number] DOB: 1921/12/22 DOA: 12/31/2016  PCP: Jeffrey Mccarty, Jeffrey S, MD Consultants:  Jeffrey Mccarty - cardiology; Jeffrey Mccarty - urology; Jeffrey Mccarty - endocrinology; Jeffrey Mccarty - GI Patient coming from:  Home - lives with alone; daughters provide breakfast daily, check on him most days, go back at dinner and bedtime; NOK: Daughter, [phone]; [phone]; [phone]  Chief Complaint: Weakness  HPI: Jeffrey Mccarty is a 83 y.o. male with medical history significant of pressure ulcers; afib on Eliquis; hypothyroidism; HTN; and BPH with retention with recent indwelling foley placement presenting with weakness. He complains about bowel problems all the time.  Family is noticing some recent memory problems.  Today, he called his daughter at work and said his daughter needed to check on him.  He told her he didn't feel well, weak, unable to eat.  "I think I'm dying."  He was very weak and unable to stand up on his own.  He seemed fine this AM.  Constant BM complaints.  BPH, placed a foley in August for retention.  They are expecting this to be long-term.  Lately he has been walking to the mailbox, which worries his daughters.  He currently has 2 bedsores.  No cough.  He breathes faster when he gets anxious.   ED Course:  UTI - given IV Rocephin.  Mildly elevated BUN/Cr.  Labile BP.  Review of Systems: As per HPI; otherwise review of systems reviewed and negative.   Ambulatory Status:  Ambulates without assistance  Past Medical History:  Diagnosis Date  . Atrial fibrillation (HCC)    on Eliquis  . Elevated PSA   . Hypertension    no longer on medication  . Hypothyroid   . Insomnia   . Kidney stone   . Polymyalgia rheumatica (HCC)    family is not aware of this diagnosis  . Pressure ulcer   . Reflux     Past Surgical History:  Procedure Laterality Date  . APPENDECTOMY    . CHOLECYSTECTOMY    . THYROIDECTOMY    . TONSILLECTOMY      Social History    Socioeconomic History  . Marital status: Widowed    Spouse name: Not on file  . Number of children: Not on file  . Years of education: Not on file  . Highest education level: Not on file  Social Needs  . Financial resource strain: Not on file  . Food insecurity - worry: Not on file  . Food insecurity - inability: Not on file  . Transportation needs - medical: Not on file  . Transportation needs - non-medical: Not on file  Occupational History  . Not on file  Tobacco Use  . Smoking status: Never Smoker  . Smokeless tobacco: Never Used  Substance and Sexual Activity  . Alcohol use: No    Alcohol/week: 0.0 oz  . Drug use: No  . Sexual activity: Not on file  Other Topics Concern  . Not on file  Social History Narrative  . Not on file    Allergies  Allergen Reactions  . Cardura [Doxazosin Mesylate]     Family History  Problem Relation Age of Onset  . Heart attack Father     Prior to Admission medications   Medication Sig Start Date End Date Taking? Authorizing Provider  DIGOX 125 MCG tablet take 1 tablet by mouth every other day 12/11/16  Yes Jeffrey Mccarty, Jeffrey S, MD  ELIQUIS 2.5 MG TABS tablet take 1 tablet by  mouth twice a day 11/05/16  Yes Mccarty, Jeffrey Pea, MD  esomeprazole (NEXIUM) 40 MG capsule take 1 capsule by mouth once daily 12/11/16  Yes Mccarty, Jeffrey Blanks, MD  feeding supplement, ENSURE COMPLETE, (ENSURE COMPLETE) LIQD Take 237 mLs by mouth 2 (two) times daily between meals. 02/03/13  Yes Leroy Sea, MD  furosemide (LASIX) 20 MG tablet Take 1 tablet (20 mg total) by mouth daily. 10/11/16  Yes Jeffrey Albert, MD  levothyroxine (SYNTHROID, LEVOTHROID) 75 MCG tablet Take 1 tablet (75 mcg total) by mouth daily before breakfast. 05/17/16  Yes Jeffrey Mccarty, Jeffrey George, MD  Potassium Chloride ER (K-TAB) 20 MEQ TBCR One p o qam 10/11/16  Yes Jeffrey Albert, MD  psyllium (HYDROCIL/METAMUCIL) 95 % PACK Take 1 packet by mouth daily as needed for mild constipation.    Yes [provider]  tamsulosin (FLOMAX) 0.4 MG CAPS capsule Take 1 capsule (0.4 mg total) by mouth daily. 11/07/16  Yes Jeffrey Albert, MD  triamcinolone ointment (KENALOG) 0.1 % apply to affected area twice a day 12/10/16  Yes Jeffrey Albert, MD  mupirocin ointment (BACTROBAN) 2 % Apply thin layer to affected area twice daily Patient not taking: Reported on 12/31/2016 12/11/16   Jeffrey Albert, MD    Physical Exam: Vitals:   12/31/16 1730 12/31/16 1836 12/31/16 1837 12/31/16 2004  BP: 104/70 94/63    Pulse: (!) 140 82    Resp: 20     Temp:  97.8 F (36.6 C)    TempSrc:  Oral    SpO2: 92% 94%  91%  Weight:   50.3 kg (111 lb)      General:  Appears calm and comfortable and is NAD; he was being fed by his daughter and eating very well throughout my evaluation Eyes:  PERRL, EOMI, normal lids, iris ENT:  Extremely hard of hearing, normal lips & tongue, mmm Neck:  no LAD, masses or thyromegaly Cardiovascular:  RRR, no m/r/g. No LE edema.  Respiratory:   CTA bilaterally with no wheezes/rales/rhonchi.  Normal respiratory effort. Abdomen:  soft, NT, ND, NABS Skin:  no rash or induration seen on limited exam Musculoskeletal:  grossly normal tone BUE/BLE, good ROM, no bony abnormality Psychiatric:  grossly normal mood and affect, speech sparse but very pleasant Neurologic:  CN 2-12 grossly intact, moves all extremities in coordinated fashion, sensation intact    Radiological Exams on Admission: Ct Abdomen Pelvis Wo Contrast  Result Date: 12/31/2016 CLINICAL DATA:  Confusion, abdominal pain, on antibiotics for UTI EXAM: CT ABDOMEN AND PELVIS WITHOUT CONTRAST TECHNIQUE: Multidetector CT imaging of the abdomen and pelvis was performed following the standard protocol without IV contrast. COMPARISON:  09/24/2016 FINDINGS: Lower chest: Small bilateral pleural effusions, left greater than right. Mild dependent atelectasis in the bilateral lower lobes. Cardiomegaly.  Coronary  atherosclerosis the LAD and left circumflex. Hepatobiliary: Unenhanced liver is notable for a stable 8 mm probable cyst in the left hepatic lobe (series 2/ image 23). Status post cholecystectomy. No intrahepatic extrahepatic duct dilatation. Pancreas: Within normal limits. Spleen: Within normal limits. Adrenals/Urinary Tract: 1.7 cm benign right adrenal adenoma. Left adrenal gland is within normal limits. 2.3 cm lateral left upper pole renal cyst (series 2/ image 23). Right kidney is within normal limits. No renal, ureteral, or bladder calculi.  No hydronephrosis. Bladder is decompressed by indwelling Foley catheter, although notable for mild wall thickening and perivesical stranding (series 2/image 89). Three small bladder diverticuli (series 2/ images 60 and 62). Stomach/Bowel:  Stomach is within normal limits. No evidence of bowel obstruction. Appendix is not discretely visualized. Left colon is decompressed. Vascular/Lymphatic: No evidence of abdominal aortic aneurysm. Atherosclerotic calcifications of the abdominal aorta and Mccarty vessels. No suspicious abdominopelvic lymphadenopathy. Reproductive: Prostatomegaly. Other: No abdominopelvic ascites. Musculoskeletal: Severe compression fracture deformity at L1 with mild retropulsion, unchanged. Mild degenerative changes the lower thoracic spine. IMPRESSION: Mild bladder wall thickening with perivesical stranding, likely corresponding to the patient's known UTI. Indwelling Foley catheter. Small bilateral pleural effusions, left greater than right. Additional stable ancillary findings as above. Electronically Signed   By: Charline Bills M.D.   On: 12/31/2016 16:45   Dg Chest 2 View  Result Date: 12/31/2016 CLINICAL DATA:  Weakness and abdominal pain. EXAM: CHEST  2 VIEW COMPARISON:  09/24/2016 FINDINGS: Cardiomegaly. Pulmonary venous hypertension with interstitial and early alveolar edema. Small effusions in the posterior costophrenic angles. No acute bone  finding. IMPRESSION: Congestive heart failure. Cardiomegaly. Venous hypertension. Interstitial and early alveolar edema with small effusions. Electronically Signed   By: Paulina Fusi M.D.   On: 12/31/2016 16:21   Ct Head Wo Contrast  Result Date: 12/31/2016 CLINICAL DATA:  Muscle weakness EXAM: CT HEAD WITHOUT CONTRAST TECHNIQUE: Contiguous axial images were obtained from the base of the skull through the vertex without intravenous contrast. COMPARISON:  CT head 09/16/2014 FINDINGS: Brain: Advanced atrophy with progression from the prior study. Negative for hydrocephalus. Patchy hypodensity throughout the cerebral white matter has progressed. Hypodensity in the pons has progressed. Negative for acute infarct, hemorrhage, mass Vascular: Atherosclerotic calcification. 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 Paranasal sinuses clear. Bilateral cataract removal Other: None IMPRESSION: Advanced atrophy and chronic microvascular ischemic changes with progression since 2006. No acute abnormality. Electronically Signed   By: Marlan Palau M.D.   On: 12/31/2016 16:40    EKG: Independently reviewed.  Afib with rate 73; nonspecific ST changes with no evidence of acute ischemia (initial EKG showed afib with RVR with rate-related changes)   Labs on Admission: I have personally reviewed the available labs and imaging studies at the time of the admission.  Pertinent labs:   Lactate 0.8, 1.0 TSH 5.232, free T4 pending BUN 36/Creatinine 1.42/GFR 41 - stable BNP 488.0 Troponin 0.04 WBC 6.6 Hgb 11.0 Digoxin 0.7 UA: small Hgb, large LE, +nitrite, rare bacteria, TNTC WBC Urine culture pending  Assessment/Plan Principal Problem:   UTI (urinary tract infection) Active Problems:   Hypothyroidism following radioiodine therapy   Atrial fibrillation, chronic (HCC)   Chronic anticoagulation   (HFpEF) heart failure with preserved ejection fraction  (HCC)   Dementia   UTI -Patient presenting with weakness and vague symptoms -Actually feeling much better at the time of my evaluation -UA is clearly abnormal -Has an indwelling foley and symptoms may be attributed to UTI -Urine culture pending -He was given Rocephin x 1 in the ER, will continue daily -Continue with indwelling foley due to chronic urinary retention -If he continues to feel as well as he does now, he will likely be appropriate for d/c tomorrow.  HFpEF -Patient with mildly elevated troponin and CHF on CXR -He does not have other clinical manifestations of CHF -Will hold further VIF -Will continue home Lasix and give an additional dose now -Otherwise will not treat at this time since he does appear clinically to be compensated -Will trend troponin, which may simply be mild demand ischemia  Afib on Eliquis -He is generally well rate controlled -It appears that  he takes Digoxin QOD for rate control -However, his Digoxin level is low and so it is not clear that he is compliant with this medication -With mild dementia, this medication may not be ideal for him -He is on Eliquis; this also may not be ideal for him given his increasing risk of falls (particularly since he is unwilling to use a cane for balance)  Dementia -Family reports mild progressing symptoms -CT shows advanced atrophy and microvascular changes -Family counseled about how patients with dementia often do better behaviorally while hospitalized if family members are present overnight  Hypothyroidism -His TSH level is high -Will check free T4 -Continue home dose of Synthroid for now  DVT prophylaxis: Eliquis Code Status: DNR - confirmed with family Family Communication: Daughters present throughout evaluation Disposition Plan:  Home once clinically improved Consults called: None  Admission status: It is my clinical opinion that referral for OBSERVATION is reasonable and necessary in this patient based  on the above information provided. The aforementioned taken together are felt to place the patient at high risk for further clinical deterioration. However it is anticipated that the patient may be medically stable for discharge from the hospital within 24 to 48 hours.    Jonah BlueJennifer Jannett Schmall MD [facility]  If note is complete, please contact covering daytime or nighttime physician. [email or web address] Password [email or web address]  12/31/2016, 9:45 PM

## 2016-12-31 NOTE — ED Provider Notes (Signed)
[facility] EMERGENCY DEPARTMENT Provider Note   CSN: [id number] Arrival date & time: 12/31/16  1433     History   Chief Complaint Chief Complaint  Patient presents with  . Abdominal Pain    HPI Jeffrey Mccarty is a 83 y.o. male.  HPI  Pt was seen at 1455. Per pt and his family, c/o gradual onset and worsening of persistent generalized weakness and abd pain since this morning. Pt's family states pt's foley has been draining "foul smelling urine;" it was changed last month.  Pt's family states pt has been sleeping more than usual and mildly confused (had difficulty using microwave this morning). Denies fevers, no CP/SOB, no cough, no N/V/D, no rash.    Past Medical History:  Diagnosis Date  . Atrial fibrillation (HCC)   . Elevated PSA   . Hypertension   . Hypothyroid   . Insomnia   . Kidney stone   . Polymyalgia rheumatica (HCC)   . Pressure ulcer   . Reflux     Patient Active Problem List   Diagnosis Date Noted  . AKI (acute kidney injury) (HCC) 09/24/2016  . Colitis 09/24/2016  . Gout 09/11/2016  . Hypothyroidism following radioiodine therapy 11/12/2014  . Allergic rhinitis 05/24/2013  . Malnutrition of moderate degree (HCC) 02/03/2013  . Esophageal reflux 02/01/2013  . Rash and nonspecific skin eruption 02/01/2013  . Atrial fibrillation with RVR (HCC) 01/31/2013  . URTI (acute upper respiratory infection) 01/31/2013  . ARF (acute renal failure) (HCC) 01/31/2013  . Essential hypertension 10/05/2008  . BILIARY DYSKINESIA 10/05/2008    Past Surgical History:  Procedure Laterality Date  . APPENDECTOMY    . CHOLECYSTECTOMY    . THYROIDECTOMY    . TONSILLECTOMY         Home Medications    Prior to Admission medications   Medication Sig Start Date End Date Taking? Authorizing Provider  DIGOX 125 MCG tablet take 1 tablet by mouth every other day 12/11/16  Yes [name], MD  ELIQUIS 2.5 MG TABS tablet take 1 tablet by mouth twice a day 11/05/16  Yes  Branch, Dorothe Pea, MD  esomeprazole (NEXIUM) 40 MG capsule take 1 capsule by mouth once daily 12/11/16  Yes [name], MD  feeding supplement, ENSURE COMPLETE, (ENSURE COMPLETE) LIQD Take 237 mLs by mouth 2 (two) times daily between meals. 02/03/13  Yes Leroy Sea, MD  furosemide (LASIX) 20 MG tablet Take 1 tablet (20 mg total) by mouth daily. 10/11/16  Yes [name], MD  levothyroxine (SYNTHROID, LEVOTHROID) 75 MCG tablet Take 1 tablet (75 mcg total) by mouth daily before breakfast. 05/17/16  Yes Nida, Denman George, MD  Potassium Chloride ER (K-TAB) 20 MEQ TBCR One p o qam 10/11/16  Yes [name], MD  psyllium (HYDROCIL/METAMUCIL) 95 % PACK Take 1 packet by mouth daily as needed for mild constipation.   Yes [provider]  tamsulosin (FLOMAX) 0.4 MG CAPS capsule Take 1 capsule (0.4 mg total) by mouth daily. 11/07/16  Yes [name], MD  triamcinolone ointment (KENALOG) 0.1 % apply to affected area twice a day 12/10/16  Yes [name], MD  mupirocin ointment (BACTROBAN) 2 % Apply thin layer to affected area twice daily Patient not taking: Reported on 12/31/2016 12/11/16   [name], MD    Family History Family History  Problem Relation Age of Onset  . Heart attack Father     Social History Social History  Tobacco Use  . Smoking status: Never Smoker  . Smokeless tobacco: Never Used  Substance Use Topics  . Alcohol use: No    Alcohol/week: 0.0 oz  . Drug use: No     Allergies   Cardura [doxazosin mesylate]   Review of Systems Review of Systems ROS: Statement: All systems negative except as marked or noted in the HPI; Constitutional: Negative for fever and chills. +generalized weakness.; ; Eyes: Negative for eye pain, redness and discharge. ; ; ENMT: Negative for ear pain, hoarseness, nasal congestion, sinus pressure and sore throat. ; ; Cardiovascular: Negative for chest pain, palpitations, diaphoresis, dyspnea and  peripheral edema. ; ; Respiratory: Negative for cough, wheezing and stridor. ; ; Gastrointestinal: +abd pain. Negative for nausea, vomiting, diarrhea, blood in stool, hematemesis, jaundice and rectal bleeding. . ; ; Genitourinary: +"foul smelling urine." Negative for dysuria, flank pain and hematuria. ; ; Musculoskeletal: Negative for back pain and neck pain. Negative for swelling and trauma.; ; Skin: Negative for pruritus, rash, abrasions, blisters, bruising and skin lesion.; ; Neuro: Negative for headache, lightheadedness and neck stiffness. Negative for altered level of consciousness, altered mental status, extremity weakness, paresthesias, involuntary movement, seizure and syncope.       Physical Exam Updated Vital Signs BP 100/81   Pulse (!) 41   Temp 97.6 F (36.4 C) (Oral)   Resp (!) 21   Wt 46.7 kg (103 lb)   SpO2 92%   BMI 19.46 kg/m   Patient Vitals for the past 24 hrs:  BP Temp Temp src Pulse Resp SpO2 Weight  12/31/16 1700 100/81 - - (!) 41 (!) 21 92 % -  12/31/16 1600 90/61 - - (!) 119 (!) 21 93 % -  12/31/16 1530 94/66 - - (!) 42 (!) 23 93 % -  12/31/16 1500 108/83 - - (!) 51 (!) 23 93 % -  12/31/16 1449 105/83 97.6 F (36.4 C) Oral 98 20 95 % -  12/31/16 1445 - 97.8 F (36.6 C) Oral 97 18 91 % -  12/31/16 1443 - - - - - - 46.7 kg (103 lb)     Physical Exam 1500: Physical examination:  Nursing notes reviewed; Vital signs and O2 SAT reviewed;  Constitutional: Well developed, Well nourished, In no acute distress; Head:  Normocephalic, atraumatic; Eyes: EOMI, PERRL, No scleral icterus; ENMT: Mouth and pharynx normal, Mucous membranes dry; Neck: Supple, Full range of motion, No lymphadenopathy; Cardiovascular: Regular rate and rhythm, No gallop; Respiratory: Breath sounds clear & equal bilaterally, No wheezes.  Speaking full sentences with ease, Normal respiratory effort/excursion; Chest: Nontender, Movement normal; Abdomen: Soft, Nontender, Nondistended, Normal bowel  sounds; Genitourinary: No CVA tenderness. +foley draining yellow urine.; Extremities: Pulses normal, No tenderness, No edema, No calf edema or asymmetry.; Neuro: AA&Ox3, +very HOH, otherwise major CN grossly intact.  Speech clear. Grips equal. Strength 5/5 equal bilat UE's and LE's. No apparent gross focal motor deficits in extremities.; Skin: Color normal, Warm, Dry.   ED Treatments / Results  Labs (all labs ordered are listed, but only abnormal results are displayed)   EKG  EKG Interpretation None       Radiology   Procedures Procedures (including critical care time)  Medications Ordered in ED Medications  0.9 %  sodium chloride infusion ( Intravenous New Bag/Given 12/31/16 1659)  hydrogen peroxide 3 % external solution (  Not Given 12/31/16 1725)  cefTRIAXone (ROCEPHIN) 1 g in dextrose 5 % 50 mL IVPB (1 g Intravenous New Bag/Given 12/31/16  1704)     Initial Impression / Assessment and Plan / ED Course  I have reviewed the triage vital signs and the nursing notes.  Pertinent labs & imaging results that were available during my care of the patient were reviewed by me and considered in my medical decision making (see chart for details).  MDM Reviewed: previous chart, nursing note and vitals Reviewed previous: labs and ECG Interpretation: labs, ECG, x-ray and CT scan   ED ECG REPORT   Date: 12/31/2016  Rate: 73  Rhythm: atrial fibrillation  QRS Axis: normal  Intervals: normal  ST/T Wave abnormalities: nonspecific ST/T changes  Conduction Disutrbances:none  Narrative Interpretation:   Old EKG Reviewed: changes noted; compared to EKG dated 09/24/2016, afib has replaced NSR.  I have personally reviewed the EKG tracing and agree with the computerized printout as noted.   Results for orders placed or performed during the hospital encounter of 12/31/16  Comprehensive metabolic panel  Result Value Ref Range   Sodium 140 135 - 145 mmol/L   Potassium 4.1 3.5 - 5.1 mmol/L     Chloride 106 101 - 111 mmol/L   CO2 25 22 - 32 mmol/L   Glucose, Bld 95 65 - 99 mg/dL   BUN 36 (H) 6 - 20 mg/dL   Creatinine, Ser 1.611.42 (H) 0.61 - 1.24 mg/dL   Calcium 9.3 8.9 - 09.610.3 mg/dL   Total Protein 7.5 6.5 - 8.1 g/dL   Albumin 3.6 3.5 - 5.0 g/dL   AST 33 15 - 41 U/L   ALT 21 17 - 63 U/L   Alkaline Phosphatase 54 38 - 126 U/L   Total Bilirubin 0.6 0.3 - 1.2 mg/dL   GFR calc non Af Amer 41 (L) >60 mL/min   GFR calc Af Amer 48 (L) >60 mL/min   Anion gap 9 5 - 15  Lipase, blood  Result Value Ref Range   Lipase 32 11 - 51 U/L  Troponin I  Result Value Ref Range   Troponin I 0.04 (HH) <0.03 ng/mL  Lactic acid, plasma  Result Value Ref Range   Lactic Acid, Venous 0.8 0.5 - 1.9 mmol/L  CBC with Differential  Result Value Ref Range   WBC 6.6 4.0 - 10.5 K/uL   RBC 3.86 (L) 4.22 - 5.81 MIL/uL   Hemoglobin 11.0 (L) 13.0 - 17.0 g/dL   HCT 04.534.2 (L) 40.939.0 - 81.152.0 %   MCV 88.6 78.0 - 100.0 fL   MCH 28.5 26.0 - 34.0 pg   MCHC 32.2 30.0 - 36.0 g/dL   RDW 91.414.8 78.211.5 - 95.615.5 %   Platelets 295 150 - 400 K/uL   Neutrophils Relative % 64 %   Neutro Abs 4.3 1.7 - 7.7 K/uL   Lymphocytes Relative 23 %   Lymphs Abs 1.5 0.7 - 4.0 K/uL   Monocytes Relative 9 %   Monocytes Absolute 0.6 0.1 - 1.0 K/uL   Eosinophils Relative 3 %   Eosinophils Absolute 0.2 0.0 - 0.7 K/uL   Basophils Relative 1 %   Basophils Absolute 0.1 0.0 - 0.1 K/uL  Urinalysis, Routine w reflex microscopic  Result Value Ref Range   Color, Urine YELLOW YELLOW   APPearance CLOUDY (A) CLEAR   Specific Gravity, Urine 1.006 1.005 - 1.030   pH 6.0 5.0 - 8.0   Glucose, UA NEGATIVE NEGATIVE mg/dL   Hgb urine dipstick SMALL (A) NEGATIVE   Bilirubin Urine NEGATIVE NEGATIVE   Ketones, ur NEGATIVE NEGATIVE mg/dL   Protein, ur  NEGATIVE NEGATIVE mg/dL   Nitrite POSITIVE (A) NEGATIVE   Leukocytes, UA LARGE (A) NEGATIVE   RBC / HPF 6-30 0 - 5 RBC/hpf   WBC, UA TOO NUMEROUS TO COUNT 0 - 5 WBC/hpf   Bacteria, UA RARE (A) NONE SEEN    Squamous Epithelial / LPF NONE SEEN NONE SEEN   WBC Clumps PRESENT   Digoxin level  Result Value Ref Range   Digoxin Level 0.7 (L) 0.8 - 2.0 ng/mL  Brain natriuretic peptide  Result Value Ref Range   B Natriuretic Peptide 488.0 (H) 0.0 - 100.0 pg/mL   Ct Abdomen Pelvis Wo Contrast Result Date: 12/31/2016 CLINICAL DATA:  Confusion, abdominal pain, on antibiotics for UTI EXAM: CT ABDOMEN AND PELVIS WITHOUT CONTRAST TECHNIQUE: Multidetector CT imaging of the abdomen and pelvis was performed following the standard protocol without IV contrast. COMPARISON:  09/24/2016 FINDINGS: Lower chest: Small bilateral pleural effusions, left greater than right. Mild dependent atelectasis in the bilateral lower lobes. Cardiomegaly.  Coronary atherosclerosis the LAD and left circumflex. Hepatobiliary: Unenhanced liver is notable for a stable 8 mm probable cyst in the left hepatic lobe (series 2/ image 23). Status post cholecystectomy. No intrahepatic extrahepatic duct dilatation. Pancreas: Within normal limits. Spleen: Within normal limits. Adrenals/Urinary Tract: 1.7 cm benign right adrenal adenoma. Left adrenal gland is within normal limits. 2.3 cm lateral left upper pole renal cyst (series 2/ image 23). Right kidney is within normal limits. No renal, ureteral, or bladder calculi.  No hydronephrosis. Bladder is decompressed by indwelling Foley catheter, although notable for mild wall thickening and perivesical stranding (series 2/image 89). Three small bladder diverticuli (series 2/ images 60 and 62). Stomach/Bowel: Stomach is within normal limits. No evidence of bowel obstruction. Appendix is not discretely visualized. Left colon is decompressed. Vascular/Lymphatic: No evidence of abdominal aortic aneurysm. Atherosclerotic calcifications of the abdominal aorta and branch vessels. No suspicious abdominopelvic lymphadenopathy. Reproductive: Prostatomegaly. Other: No abdominopelvic ascites. Musculoskeletal: Severe  compression fracture deformity at L1 with mild retropulsion, unchanged. Mild degenerative changes the lower thoracic spine. IMPRESSION: Mild bladder wall thickening with perivesical stranding, likely corresponding to the patient's known UTI. Indwelling Foley catheter. Small bilateral pleural effusions, left greater than right. Additional stable ancillary findings as above. Electronically Signed   By: Charline Bills M.D.   On: 12/31/2016 16:45   Dg Chest 2 View Result Date: 12/31/2016 CLINICAL DATA:  Weakness and abdominal pain. EXAM: CHEST  2 VIEW COMPARISON:  09/24/2016 FINDINGS: Cardiomegaly. Pulmonary venous hypertension with interstitial and early alveolar edema. Small effusions in the posterior costophrenic angles. No acute bone finding. IMPRESSION: Congestive heart failure. Cardiomegaly. Venous hypertension. Interstitial and early alveolar edema with small effusions. Electronically Signed   By: Paulina Fusi M.D.   On: 12/31/2016 16:21   Ct Head Wo Contrast Result Date: 12/31/2016 CLINICAL DATA:  Muscle weakness EXAM: CT HEAD WITHOUT CONTRAST TECHNIQUE: Contiguous axial images were obtained from the base of the skull through the vertex without intravenous contrast. COMPARISON:  CT head 09/16/2014 FINDINGS: Brain: Advanced atrophy with progression from the prior study. Negative for hydrocephalus. Patchy hypodensity throughout the cerebral white matter has progressed. Hypodensity in the pons has progressed. Negative for acute infarct, hemorrhage, mass Vascular: Atherosclerotic calcification. 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 Paranasal sinuses clear. Bilateral cataract removal Other: None IMPRESSION: Advanced atrophy and chronic microvascular ischemic changes with progression since 2006. No acute abnormality. Electronically Signed   By: Marlan Palau M.D.   On: 12/31/2016 16:40  1725:  +UTI, UC pending; will dose IV rocephin.  BUN/Cr mildly elevated, but near baseline. BP labile:  SBP 117 on my re-check. Dx and testing d/w pt and family.  Questions answered.  Verb understanding, agreeable to admit.  T/C to [facility] Dr. Ophelia Charter, case discussed, including:  HPI, pertinent PM/SHx, VS/PE, dx testing, ED course and treatment:  Agreeable to admit.     Final Clinical Impressions(s) / ED Diagnoses   Final diagnoses:  None    ED Discharge Orders    None       Samuel Jester, DO 01/04/17 0725

## 2017-01-01 DIAGNOSIS — M6281 Muscle weakness (generalized): Secondary | ICD-10-CM | POA: Diagnosis not present

## 2017-01-01 DIAGNOSIS — N3000 Acute cystitis without hematuria: Secondary | ICD-10-CM

## 2017-01-01 LAB — CBC
HEMATOCRIT: 33.1 % — AB (ref 39.0–52.0)
HEMOGLOBIN: 10.8 g/dL — AB (ref 13.0–17.0)
MCH: 28.9 pg (ref 26.0–34.0)
MCHC: 32.6 g/dL (ref 30.0–36.0)
MCV: 88.5 fL (ref 78.0–100.0)
Platelets: 278 10*3/uL (ref 150–400)
RBC: 3.74 MIL/uL — ABNORMAL LOW (ref 4.22–5.81)
RDW: 14.8 % (ref 11.5–15.5)
WBC: 9.6 10*3/uL (ref 4.0–10.5)

## 2017-01-01 LAB — BASIC METABOLIC PANEL
ANION GAP: 8 (ref 5–15)
BUN: 34 mg/dL — AB (ref 6–20)
CHLORIDE: 105 mmol/L (ref 101–111)
CO2: 25 mmol/L (ref 22–32)
Calcium: 8.8 mg/dL — ABNORMAL LOW (ref 8.9–10.3)
Creatinine, Ser: 1.54 mg/dL — ABNORMAL HIGH (ref 0.61–1.24)
GFR calc Af Amer: 43 mL/min — ABNORMAL LOW (ref >60)
GFR, EST NON AFRICAN AMERICAN: 37 mL/min — AB (ref >60)
GLUCOSE: 88 mg/dL (ref 65–99)
POTASSIUM: 4.1 mmol/L (ref 3.5–5.1)
Sodium: 138 mmol/L (ref 135–145)

## 2017-01-01 LAB — TROPONIN I
Troponin I: 0.04 ng/mL (ref <0.03)
Troponin I: 0.03 ng/mL (ref <0.03)

## 2017-01-01 LAB — T4, FREE: FREE T4: 0.91 ng/dL (ref 0.61–1.12)

## 2017-01-01 MED ORDER — CIPROFLOXACIN HCL 250 MG PO TABS: 250.0000 mg | ORAL_TABLET | Freq: Two times a day (BID) | ORAL | 0 refills | Status: AC

## 2017-01-01 NOTE — Care Management Obs Status (Signed)
MEDICARE OBSERVATION STATUS NOTIFICATION   Patient Details  Name: Marlis EdelsonRoy C Emberson MRN: [id number] Date of Birth: [birth year]   Medicare Observation Status Notification Given:  Other (see comment)(pt discharged < 24 hrs)    Malcolm Metrohildress, Markail Diekman Demske, RN 01/01/2017, 11:49 AM

## 2017-01-01 NOTE — Evaluation (Signed)
Physical Therapy Evaluation Patient Details Name: Jeffrey Mccarty MRN: [id number] DOB: [birth year] Today's Date: 01/01/2017   History of Present Illness  Jeffrey Mccarty is a 83 y.o. male with medical history significant of pressure ulcers; afib on Eliquis; hypothyroidism; HTN; and BPH with retention with recent indwelling foley placement presenting with weakness. He complains about bowel problems all the time.  Family is noticing some recent memory problems.  Today, he called his daughter at work and said his daughter needed to check on him.  He told her he didn't feel well, weak, unable to eat.  "I think I'm dying."  He was very weak and unable to stand up on his own.  He seemed fine this AM.  Constant BM complaints.  BPH, placed a foley in August for retention.  They are expecting this to be long-term.  Lately he has been walking to the mailbox, which worries his daughters.  He currently has 2 bedsores.  No cough.  He breathes faster when he gets anxious.    Clinical Impression  Patient functioning near baseline for functional mobility/gait, limited for gait secondary to c/o fatigue and tolerated staying up in chair after walking with SPC and RW.  Patient demonstrates improved balance using RW and will benefit from use at home.  PLAN: patient to be discharged home today and discharged from physical therapy to care of nursing for ambulation and out of bed as tolerated for length of stay.    Follow Up Recommendations Home health PT    Equipment Recommendations  Rolling walker with 5" wheels    Recommendations for Other Services       Precautions / Restrictions Precautions Precautions: Fall Restrictions Weight Bearing Restrictions: No      Mobility  Bed Mobility Overal bed mobility: Modified Independent                Transfers Overall transfer level: Needs assistance Equipment used: Rolling walker (2 wheeled);Straight cane Transfers: Sit to/from [facility] Sit  to Stand: Supervision Stand pivot transfers: Supervision       General transfer comment: upon intially standing, slightly unsteady, but improved after a 2-3 minutes of being up  Ambulation/Gait Ambulation/Gait assistance: Supervision Ambulation Distance (Feet): 50 Feet Assistive device: Rolling walker (2 wheeled);Straight cane Gait Pattern/deviations: WFL(Within Functional Limits)   Gait velocity interpretation: Below normal speed for age/gender General Gait Details: grossly WFL except slower than normal cadence without loss of balance  Stairs            Wheelchair Mobility    Modified Rankin (Stroke Patients Only)       Balance Overall balance assessment: Needs assistance Sitting-balance support: No upper extremity supported;Feet supported Sitting balance-Leahy Scale: Good     Standing balance support: During functional activity;Single extremity supported Standing balance-Leahy Scale: Fair Standing balance comment: good using RW                             Pertinent Vitals/Pain Pain Assessment: No/denies pain    Home Living Family/patient expects to be discharged to:: Private residence Living Arrangements: Alone Available Help at Discharge: Family;Available PRN/intermittently Type of Home: House Home Access: Level entry     Home Layout: One level Home Equipment: Cane - quad      Prior Function Level of Independence: Independent with assistive device(s)               Hand Dominance  Extremity/Trunk Assessment   Upper Extremity Assessment Upper Extremity Assessment: Generalized weakness    Lower Extremity Assessment Lower Extremity Assessment: Generalized weakness    Cervical / Trunk Assessment Cervical / Trunk Assessment: Normal  Communication   Communication: HOH  Cognition Arousal/Alertness: Awake/alert Behavior During Therapy: WFL for tasks assessed/performed Overall Cognitive Status: Within Functional Limits  for tasks assessed                                        General Comments      Exercises     Assessment/Plan    PT Assessment All further PT needs can be met in the next venue of care  PT Problem List Decreased strength;Decreased mobility;Decreased activity tolerance       PT Treatment Interventions      PT Goals (Current goals can be found in the Care Plan section)  Acute Rehab PT Goals Patient Stated Goal: return home with family to assist as needed PT Goal Formulation: With patient/family Time For Goal Achievement: 01/01/17 Potential to Achieve Goals: Good    Frequency     Barriers to discharge        Co-evaluation               AM-PAC PT "6 Clicks" Daily Activity  Outcome Measure Difficulty turning over in bed (including adjusting bedclothes, sheets and blankets)?: None Difficulty moving from lying on back to sitting on the side of the bed? : None Difficulty sitting down on and standing up from a chair with arms (e.g., wheelchair, bedside commode, etc,.)?: None Help needed moving to and from a bed to chair (including a wheelchair)?: None Help needed walking in hospital room?: A Little Help needed climbing 3-5 steps with a railing? : A Little 6 Click Score: 22    End of Session   Activity Tolerance: Patient tolerated treatment well Patient left: in chair;with call bell/phone within reach;with family/visitor present   PT Visit Diagnosis: Unsteadiness on feet (R26.81);Other abnormalities of gait and mobility (R26.89);Muscle weakness (generalized) (M62.81)    Time: 6333-5456 PT Time Calculation (min) (ACUTE ONLY): 27 min   Charges:   PT Evaluation $PT Eval Low Complexity: 1 Low PT Treatments $Therapeutic Activity: 8-22 mins   PT G Codes:   PT G-Codes **NOT FOR INPATIENT CLASS** Functional Assessment Tool Used: AM-PAC 6 Clicks Basic Mobility Functional Limitation: Mobility: Walking and moving around Mobility: Walking and Moving  Around Current Status ([id number]): At least 20 percent but less than 40 percent impaired, limited or restricted Mobility: Walking and Moving Around Goal Status [phone]): At least 20 percent but less than 40 percent impaired, limited or restricted Mobility: Walking and Moving Around Discharge Status [phone]): At least 20 percent but less than 40 percent impaired, limited or restricted    12:05 PM, 01/01/17 Lonell Grandchild, MPT Physical Therapist with [facility] 336 [phone] office [phone] mobile phone

## 2017-01-01 NOTE — Progress Notes (Signed)
Patient IV and telemetry removed, tolerated well. Patient and family given discharge at bedside.

## 2017-01-01 NOTE — Discharge Summary (Signed)
Physician Discharge Summary  Jeffrey EdelsonRoy C Bena WUJ:[id number] DOB: 1922/01/21 DOA: 12/31/2016  PCP: Merlyn AlbertLuking, [name], MD  Admit date: 12/31/2016 Discharge date: 01/01/2017  Time spent: 45 minutes  Recommendations for Outpatient Follow-up:  -To be discharged home today. -Advised to follow-up with PCP in 2 weeks. -We will need to continue Cipro for 7 days for treatment of his UTI.  Discharge Diagnoses:  Principal Problem:   UTI (urinary tract infection) Active Problems:   Hypothyroidism following radioiodine therapy   Atrial fibrillation, chronic (HCC)   Chronic anticoagulation   (HFpEF) heart failure with preserved ejection fraction [facility](HCC)   Dementia   Discharge Condition: Stable and improved  Filed Weights   12/31/16 1443 12/31/16 1837  Weight: 46.7 kg (103 lb) 50.3 kg (111 lb)    History of present illness:  As per Dr. Ophelia CharterYates on 12/30: Jeffrey Mccarty is a 83 y.o. male with medical history significant of pressure ulcers; afib on Eliquis; hypothyroidism; HTN; and BPH with retention with recent indwelling foley placement presenting with weakness. He complains about bowel problems all the time.  Family is noticing some recent memory problems.  Today, he called his daughter at work and said his daughter needed to check on him.  He told her he didn't feel well, weak, unable to eat.  "I think I'm dying."  He was very weak and unable to stand up on his own.  He seemed fine this AM.  Constant BM complaints.  BPH, placed a foley in August for retention.  They are expecting this to be long-term.  Lately he has been walking to the mailbox, which worries his daughters.  He currently has 2 bedsores.  No cough.  He breathes faster when he gets anxious.    Hospital Course:   UTI -Due to chronic indwelling Foley catheter. -Was given Rocephin while hospitalized, will discharge home on Cipro to continue 7 days of treatment.  Chronic urinary retention -With indwelling Foley catheter, presumed  due to BPH. -Has outpatient follow-up with urology scheduled for December.  Chronic systolic CHF -Compensated.  Atrial fibrillation -Rate controlled, anticoagulated on Eliquis.  Mild dementia -No behavioral disturbances while hospitalized.  Hypothyroidism -Continue home dose of Synthroid.   Procedures:  None   Consultations:  None  Discharge Instructions  Discharge Instructions    Diet - low sodium heart healthy   Complete by:  As directed    Increase activity slowly   Complete by:  As directed      Allergies as of 01/01/2017      Reactions   Cardura [doxazosin Mesylate]       Medication List    TAKE these medications   ciprofloxacin 250 MG tablet Commonly known as:  CIPRO Take 1 tablet (250 mg total) by mouth 2 (two) times daily for 7 days.   DIGOX 0.125 MG tablet Generic drug:  digoxin take 1 tablet by mouth every other day   ELIQUIS 2.5 MG Tabs tablet Generic drug:  apixaban take 1 tablet by mouth twice a day   esomeprazole 40 MG capsule Commonly known as:  NEXIUM take 1 capsule by mouth once daily   feeding supplement (ENSURE COMPLETE) Liqd Take 237 mLs by mouth 2 (two) times daily between meals.   furosemide 20 MG tablet Commonly known as:  LASIX Take 1 tablet (20 mg total) by mouth daily.   levothyroxine 75 MCG tablet Commonly known as:  SYNTHROID, LEVOTHROID Take 1 tablet (75 mcg total) by mouth daily before breakfast.  Potassium Chloride ER 20 MEQ Tbcr Commonly known as:  K-TAB One p o qam   psyllium 95 % Pack Commonly known as:  HYDROCIL/METAMUCIL Take 1 packet by mouth daily as needed for mild constipation.   tamsulosin 0.4 MG Caps capsule Commonly known as:  FLOMAX Take 1 capsule (0.4 mg total) by mouth daily.   triamcinolone ointment 0.1 % Commonly known as:  KENALOG apply to affected area twice a day            Durable Medical Equipment  (From admission, onward)        Start     Ordered   01/01/17 1142  For  home use only DME Walker rolling  Once    Question:  Patient needs a walker to treat with the following condition  Answer:  General weakness   01/01/17 1141     Allergies  Allergen Reactions  . Cardura [Doxazosin Mesylate]    Follow-up Information    [name], MD. Schedule an appointment as soon as possible for a visit in 2 week(s).   Specialty:  Family Medicine Contact information: [address] [place] [address] [phone]            The results of significant diagnostics from this hospitalization (including imaging, microbiology, ancillary and laboratory) are listed below for reference.    Significant Diagnostic Studies: Ct Abdomen Pelvis Wo Contrast  Result Date: 12/31/2016 CLINICAL DATA:  Confusion, abdominal pain, on antibiotics for UTI EXAM: CT ABDOMEN AND PELVIS WITHOUT CONTRAST TECHNIQUE: Multidetector CT imaging of the abdomen and pelvis was performed following the standard protocol without IV contrast. COMPARISON:  09/24/2016 FINDINGS: Lower chest: Small bilateral pleural effusions, left greater than right. Mild dependent atelectasis in the bilateral lower lobes. Cardiomegaly.  Coronary atherosclerosis the LAD and left circumflex. Hepatobiliary: Unenhanced liver is notable for a stable 8 mm probable cyst in the left hepatic lobe (series 2/ image 23). Status post cholecystectomy. No intrahepatic extrahepatic duct dilatation. Pancreas: Within normal limits. Spleen: Within normal limits. Adrenals/Urinary Tract: 1.7 cm benign right adrenal adenoma. Left adrenal gland is within normal limits. 2.3 cm lateral left upper pole renal cyst (series 2/ image 23). Right kidney is within normal limits. No renal, ureteral, or bladder calculi.  No hydronephrosis. Bladder is decompressed by indwelling Foley catheter, although notable for mild wall thickening and perivesical stranding (series 2/image 89). Three small bladder diverticuli (series 2/ images 60 and 62).  Stomach/Bowel: Stomach is within normal limits. No evidence of bowel obstruction. Appendix is not discretely visualized. Left colon is decompressed. Vascular/Lymphatic: No evidence of abdominal aortic aneurysm. Atherosclerotic calcifications of the abdominal aorta and branch vessels. No suspicious abdominopelvic lymphadenopathy. Reproductive: Prostatomegaly. Other: No abdominopelvic ascites. Musculoskeletal: Severe compression fracture deformity at L1 with mild retropulsion, unchanged. Mild degenerative changes the lower thoracic spine. IMPRESSION: Mild bladder wall thickening with perivesical stranding, likely corresponding to the patient's known UTI. Indwelling Foley catheter. Small bilateral pleural effusions, left greater than right. Additional stable ancillary findings as above. Electronically Signed   By: Charline Bills M.D.   On: 12/31/2016 16:45   Dg Chest 2 View  Result Date: 12/31/2016 CLINICAL DATA:  Weakness and abdominal pain. EXAM: CHEST  2 VIEW COMPARISON:  09/24/2016 FINDINGS: Cardiomegaly. Pulmonary venous hypertension with interstitial and early alveolar edema. Small effusions in the posterior costophrenic angles. No acute bone finding. IMPRESSION: Congestive heart failure. Cardiomegaly. Venous hypertension. Interstitial and early alveolar edema with small effusions. Electronically Signed   By: Loraine Leriche  Shogry M.D.   On: 12/31/2016 16:21   Ct Head Wo Contrast  Result Date: 12/31/2016 CLINICAL DATA:  Muscle weakness EXAM: CT HEAD WITHOUT CONTRAST TECHNIQUE: Contiguous axial images were obtained from the base of the skull through the vertex without intravenous contrast. COMPARISON:  CT head 09/16/2014 FINDINGS: Brain: Advanced atrophy with progression from the prior study. Negative for hydrocephalus. Patchy hypodensity throughout the cerebral white matter has progressed. Hypodensity in the pons has progressed. Negative for acute infarct, hemorrhage, mass Vascular: Atherosclerotic  calcification. 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 Paranasal sinuses clear. Bilateral cataract removal Other: None IMPRESSION: Advanced atrophy and chronic microvascular ischemic changes with progression since 2006. No acute abnormality. Electronically Signed   By: Marlan Palauharles  Clark M.D.   On: 12/31/2016 16:40    Microbiology: No results found for this or any previous visit (from the past 240 hour(s)).   Labs: Basic Metabolic Panel: Recent Labs  Lab 12/31/16 1529 01/01/17 0342  NA 140 138  K 4.1 4.1  CL 106 105  CO2 25 25  GLUCOSE 95 88  BUN 36* 34*  CREATININE 1.42* 1.54*  CALCIUM 9.3 8.8*   Liver Function Tests: Recent Labs  Lab 12/31/16 1529  AST 33  ALT 21  ALKPHOS 54  BILITOT 0.6  PROT 7.5  ALBUMIN 3.6   Recent Labs  Lab 12/31/16 1529  LIPASE 32   No results for input(s): AMMONIA in the last 168 hours. CBC: Recent Labs  Lab 12/31/16 1529 01/01/17 0342  WBC 6.6 9.6  NEUTROABS 4.3  --   HGB 11.0* 10.8*  HCT 34.2* 33.1*  MCV 88.6 88.5  PLT 295 278   Cardiac Enzymes: Recent Labs  Lab 12/31/16 1529 12/31/16 2202 01/01/17 0342 01/01/17 1034  TROPONINI 0.04* 0.04* 0.04* 0.03*   BNP: BNP (last 3 results) Recent Labs    12/31/16 1529  BNP 488.0*    ProBNP (last 3 results) No results for input(s): PROBNP in the last 8760 hours.  CBG: No results for input(s): GLUCAP in the last 168 hours.     Signed:  Chaya Janstela Hernandez Acosta  [facility] Pager: [phone] 01/01/2017, 3:24 PM

## 2017-01-01 NOTE — Care Management Note (Signed)
Case Management Note  Patient Details  Name: Jeffrey Mccarty MRN: [id number] Date of Birth: Oct 13, 1921         Admitted with UTI. Pt is from home, lives alone, has two children and SIL who are very active and check on him during the day. He is ind with ADL;s, using no assistive devices with ambulation pta. PT has recommended HH PT and RW. Daughter (Patsy) at bedside. Pt and family at bedside defer HH/DME agency preference to daughter Lupita Leash(Donna). CM  Contacted [name] via phone who has chosen [facility] from list of HH and DME choices[facility], aware of referral and will pull pt info from chart, RW will be delivered to pt room prior to DC. [facility] daughter [name] to make appointment.            Expected Discharge Date:  01/01/17               Expected Discharge Plan:  Home w Home Health Services  In-House Referral:  NA  Discharge planning Services  CM Consult  Post Acute Care Choice:  Home Health Choice offered to:  Patient, Adult Children  DME Arranged:  Walker rolling DME Agency:  [facility]  HH Arranged:  PT, TPN HH Agency:     Status of Service:  Completed, signed off  Malcolm MetroChildress, Sarha Bartelt Demske, RN 01/01/2017, 12:06 PM

## 2017-01-01 NOTE — Consult Note (Signed)
WOC Nurse wound consult note Reason for Consult:Bilateral trochanter partial thickness pressure injury.  Nonblanchable erythema to elbows and sacrum, present on admission has resolved.  But will continue to offload and turn/reposition to avoid these at risk areas. Remote consult performed with bedside RN, Amy Wound type:Stage 2 pressure injury Pressure Injury POA: Yes Measurement:Left trochanter:  1 cm x 2 cm x 0.1 cm Right trochanter:  2 cm x 1 cm  X 0.1 cm  Wound VFI:EPPIbed:pink and moist Drainage (amount, consistency, odor) scant weeping Periwound:intact Dressing procedure/placement/frequency:Silicone border foam dressing.  Change every three days and PRN soilage.  Turn and reposition every two hours Will not follow at this time.  Please re-consult if needed.  Maple HudsonKaren Tyquisha Sharps RN BSN [facility] Pager [phone]

## 2017-01-02 ENCOUNTER — Other Ambulatory Visit: Payer: Self-pay | Admitting: Family Medicine

## 2017-01-02 ENCOUNTER — Encounter: Payer: Self-pay | Admitting: Family Medicine

## 2017-01-03 LAB — URINE CULTURE

## 2017-01-04 ENCOUNTER — Telehealth: Payer: Self-pay | Admitting: Family Medicine

## 2017-01-04 DIAGNOSIS — Z466 Encounter for fitting and adjustment of urinary device: Secondary | ICD-10-CM | POA: Diagnosis not present

## 2017-01-04 DIAGNOSIS — E890 Postprocedural hypothyroidism: Secondary | ICD-10-CM | POA: Diagnosis not present

## 2017-01-04 DIAGNOSIS — T83511D Infection and inflammatory reaction due to indwelling urethral catheter, subsequent encounter: Secondary | ICD-10-CM | POA: Diagnosis not present

## 2017-01-04 DIAGNOSIS — R339 Retention of urine, unspecified: Secondary | ICD-10-CM | POA: Diagnosis not present

## 2017-01-04 DIAGNOSIS — N401 Enlarged prostate with lower urinary tract symptoms: Secondary | ICD-10-CM | POA: Diagnosis not present

## 2017-01-04 DIAGNOSIS — I5022 Chronic systolic (congestive) heart failure: Secondary | ICD-10-CM | POA: Diagnosis not present

## 2017-01-04 DIAGNOSIS — I482 Chronic atrial fibrillation: Secondary | ICD-10-CM | POA: Diagnosis not present

## 2017-01-04 DIAGNOSIS — F0280 Dementia in other diseases classified elsewhere without behavioral disturbance: Secondary | ICD-10-CM | POA: Diagnosis not present

## 2017-01-04 DIAGNOSIS — G309 Alzheimer's disease, unspecified: Secondary | ICD-10-CM | POA: Diagnosis not present

## 2017-01-04 NOTE — Telephone Encounter (Signed)
That is fine 

## 2017-01-04 NOTE — Telephone Encounter (Signed)
Requesting order for PT 1 time a week for 1 week, 2 times a week for 4 weeks.

## 2017-01-04 NOTE — Telephone Encounter (Signed)
Debbie with [facility] is aware. 

## 2017-01-08 DIAGNOSIS — E890 Postprocedural hypothyroidism: Secondary | ICD-10-CM | POA: Diagnosis not present

## 2017-01-08 DIAGNOSIS — G309 Alzheimer's disease, unspecified: Secondary | ICD-10-CM | POA: Diagnosis not present

## 2017-01-08 DIAGNOSIS — R339 Retention of urine, unspecified: Secondary | ICD-10-CM | POA: Diagnosis not present

## 2017-01-08 DIAGNOSIS — N401 Enlarged prostate with lower urinary tract symptoms: Secondary | ICD-10-CM | POA: Diagnosis not present

## 2017-01-08 DIAGNOSIS — T83511D Infection and inflammatory reaction due to indwelling urethral catheter, subsequent encounter: Secondary | ICD-10-CM | POA: Diagnosis not present

## 2017-01-08 DIAGNOSIS — F0280 Dementia in other diseases classified elsewhere without behavioral disturbance: Secondary | ICD-10-CM | POA: Diagnosis not present

## 2017-01-08 DIAGNOSIS — Z466 Encounter for fitting and adjustment of urinary device: Secondary | ICD-10-CM | POA: Diagnosis not present

## 2017-01-08 DIAGNOSIS — I5022 Chronic systolic (congestive) heart failure: Secondary | ICD-10-CM | POA: Diagnosis not present

## 2017-01-08 DIAGNOSIS — I482 Chronic atrial fibrillation: Secondary | ICD-10-CM | POA: Diagnosis not present

## 2017-01-10 DIAGNOSIS — F0280 Dementia in other diseases classified elsewhere without behavioral disturbance: Secondary | ICD-10-CM | POA: Diagnosis not present

## 2017-01-10 DIAGNOSIS — T83511D Infection and inflammatory reaction due to indwelling urethral catheter, subsequent encounter: Secondary | ICD-10-CM | POA: Diagnosis not present

## 2017-01-10 DIAGNOSIS — N401 Enlarged prostate with lower urinary tract symptoms: Secondary | ICD-10-CM | POA: Diagnosis not present

## 2017-01-10 DIAGNOSIS — E890 Postprocedural hypothyroidism: Secondary | ICD-10-CM | POA: Diagnosis not present

## 2017-01-10 DIAGNOSIS — Z466 Encounter for fitting and adjustment of urinary device: Secondary | ICD-10-CM | POA: Diagnosis not present

## 2017-01-10 DIAGNOSIS — I482 Chronic atrial fibrillation: Secondary | ICD-10-CM | POA: Diagnosis not present

## 2017-01-10 DIAGNOSIS — G309 Alzheimer's disease, unspecified: Secondary | ICD-10-CM | POA: Diagnosis not present

## 2017-01-10 DIAGNOSIS — I5022 Chronic systolic (congestive) heart failure: Secondary | ICD-10-CM | POA: Diagnosis not present

## 2017-01-10 DIAGNOSIS — R339 Retention of urine, unspecified: Secondary | ICD-10-CM | POA: Diagnosis not present

## 2017-01-14 ENCOUNTER — Encounter (INDEPENDENT_AMBULATORY_CARE_PROVIDER_SITE_OTHER): Payer: Self-pay | Admitting: Internal Medicine

## 2017-01-14 ENCOUNTER — Ambulatory Visit (INDEPENDENT_AMBULATORY_CARE_PROVIDER_SITE_OTHER): Payer: Medicare HMO | Admitting: Internal Medicine

## 2017-01-14 VITALS — BP 120/70 | HR 60 | Temp 98.3°F | Ht 61.0 in | Wt 106.0 lb

## 2017-01-14 DIAGNOSIS — K219 Gastro-esophageal reflux disease without esophagitis: Secondary | ICD-10-CM | POA: Diagnosis not present

## 2017-01-14 MED ORDER — PANTOPRAZOLE SODIUM 40 MG PO TBEC: 40.0000 mg | DELAYED_RELEASE_TABLET | Freq: Every day | ORAL | 5 refills | Status: AC

## 2017-01-14 NOTE — Progress Notes (Addendum)
Subjective:    Patient ID: Jeffrey Mccarty, male    DOB: 1921/09/18, 83 y.o.   MRN: [id number]  HPI Referred by Dr Gerda DissLuking for abdominal pain .  Daughter states patient complains of abdominal pain. He points to his epigastric region. Has had the pian for 5-6 months.  The pain has been constant. He sits on the commode for hours per daughter. Has a BM daily.   CT scan was unrevealing.      Recently treated with a UTI 12/31/2016 at [facility]. (Observation) He presented with generalized weakness and abdominal pain.  Foley had been draining foul smelling urine. Had been sleeping more and confused. Discharged home with Cipro and Flagyl x 7 days. He has finished.  Appetite is good. No weight loss. Usually has a BM daily.  CT scan revealed IMPRESSION: Mild bladder wall thickening with perivesical stranding, likely corresponding to the patient's known UTI. Indwelling Foley catheter. Small bilateral pleural effusions, left greater than right. Additional stable ancillary findings as above.   Takes Eliquis for atrial fib.  Review of Systems Past Medical History:  Diagnosis Date  . Atrial fibrillation (HCC)    on Eliquis  . Elevated PSA   . Hypertension    no longer on medication  . Hypothyroid   . Insomnia   . Kidney stone   . Polymyalgia rheumatica (HCC)    family is not aware of this diagnosis  . Pressure ulcer   . Reflux     Past Surgical History:  Procedure Laterality Date  . APPENDECTOMY    . CHOLECYSTECTOMY    . THYROIDECTOMY    . TONSILLECTOMY      Allergies  Allergen Reactions  . Cardura [Doxazosin Mesylate]     Current Outpatient Medications on File Prior to Visit  Medication Sig Dispense Refill  . DIGOX 125 MCG tablet take 1 tablet by mouth every other day 45 tablet 1  . ELIQUIS 2.5 MG TABS tablet take 1 tablet by mouth twice a day 90 tablet 3  . esomeprazole (NEXIUM) 40 MG capsule take 1 capsule by mouth once daily 90 capsule 1  . feeding supplement, ENSURE COMPLETE,  (ENSURE COMPLETE) LIQD Take 237 mLs by mouth 2 (two) times daily between meals. 30 Bottle 1  . levothyroxine (SYNTHROID, LEVOTHROID) 75 MCG tablet Take 1 tablet (75 mcg total) by mouth daily before breakfast. 30 tablet 12  . furosemide (LASIX) 20 MG tablet Take 1 tablet (20 mg total) by mouth daily. (Patient not taking: Reported on 01/14/2017) 30 tablet 11  . psyllium (HYDROCIL/METAMUCIL) 95 % PACK Take 1 packet by mouth daily as needed for mild constipation.    . tamsulosin (FLOMAX) 0.4 MG CAPS capsule Take 1 capsule (0.4 mg total) by mouth daily. 30 capsule 6  . triamcinolone ointment (KENALOG) 0.1 % apply to affected area twice a day 60 g 3   No current facility-administered medications on file prior to visit.         Objective:   Physical Exam Blood pressure 120/70, pulse 60, temperature 98.3 F (36.8 C), height 5\' 1"  (1.549 m), weight 106 lb (48.1 kg). Alert. Hard of hearing. Skin warm and dry. Oral mucosa is moist.   . Sclera anicteric, conjunctivae is pink. Thyroid not enlarged. No cervical lymphadenopathy. Lungs clear. Heart regular rate and rhythm.  Abdomen is soft. Bowel sounds are positive. No hepatomegaly. No abdominal masses felt.Slight epigastric tenderness.  No edema to lower extremities.   Patient is frail looking.  Assessment & Plan:  Possible GERD. Am going to start him on Protonix 40mg  daily. OV in 8 weeks.  Stop the Nexium.

## 2017-01-14 NOTE — Patient Instructions (Signed)
Stop the Nexium. Start Protonix 40mg  daily.  Will bring back in 8 weeks.

## 2017-01-15 ENCOUNTER — Ambulatory Visit: Payer: Medicare HMO | Admitting: Urology

## 2017-01-15 DIAGNOSIS — R3914 Feeling of incomplete bladder emptying: Secondary | ICD-10-CM | POA: Diagnosis not present

## 2017-01-15 DIAGNOSIS — N401 Enlarged prostate with lower urinary tract symptoms: Secondary | ICD-10-CM

## 2017-01-16 DIAGNOSIS — T83511D Infection and inflammatory reaction due to indwelling urethral catheter, subsequent encounter: Secondary | ICD-10-CM | POA: Diagnosis not present

## 2017-01-16 DIAGNOSIS — N401 Enlarged prostate with lower urinary tract symptoms: Secondary | ICD-10-CM | POA: Diagnosis not present

## 2017-01-16 DIAGNOSIS — F0280 Dementia in other diseases classified elsewhere without behavioral disturbance: Secondary | ICD-10-CM | POA: Diagnosis not present

## 2017-01-16 DIAGNOSIS — I482 Chronic atrial fibrillation: Secondary | ICD-10-CM | POA: Diagnosis not present

## 2017-01-16 DIAGNOSIS — E890 Postprocedural hypothyroidism: Secondary | ICD-10-CM | POA: Diagnosis not present

## 2017-01-16 DIAGNOSIS — G309 Alzheimer's disease, unspecified: Secondary | ICD-10-CM | POA: Diagnosis not present

## 2017-01-16 DIAGNOSIS — Z466 Encounter for fitting and adjustment of urinary device: Secondary | ICD-10-CM | POA: Diagnosis not present

## 2017-01-16 DIAGNOSIS — I5022 Chronic systolic (congestive) heart failure: Secondary | ICD-10-CM | POA: Diagnosis not present

## 2017-01-16 DIAGNOSIS — R339 Retention of urine, unspecified: Secondary | ICD-10-CM | POA: Diagnosis not present

## 2017-01-17 DIAGNOSIS — Z466 Encounter for fitting and adjustment of urinary device: Secondary | ICD-10-CM | POA: Diagnosis not present

## 2017-01-17 DIAGNOSIS — N401 Enlarged prostate with lower urinary tract symptoms: Secondary | ICD-10-CM | POA: Diagnosis not present

## 2017-01-17 DIAGNOSIS — R339 Retention of urine, unspecified: Secondary | ICD-10-CM | POA: Diagnosis not present

## 2017-01-17 DIAGNOSIS — I482 Chronic atrial fibrillation: Secondary | ICD-10-CM | POA: Diagnosis not present

## 2017-01-18 DIAGNOSIS — I5022 Chronic systolic (congestive) heart failure: Secondary | ICD-10-CM | POA: Diagnosis not present

## 2017-01-18 DIAGNOSIS — F0280 Dementia in other diseases classified elsewhere without behavioral disturbance: Secondary | ICD-10-CM | POA: Diagnosis not present

## 2017-01-18 DIAGNOSIS — Z466 Encounter for fitting and adjustment of urinary device: Secondary | ICD-10-CM | POA: Diagnosis not present

## 2017-01-18 DIAGNOSIS — N401 Enlarged prostate with lower urinary tract symptoms: Secondary | ICD-10-CM | POA: Diagnosis not present

## 2017-01-18 DIAGNOSIS — E890 Postprocedural hypothyroidism: Secondary | ICD-10-CM | POA: Diagnosis not present

## 2017-01-18 DIAGNOSIS — G309 Alzheimer's disease, unspecified: Secondary | ICD-10-CM | POA: Diagnosis not present

## 2017-01-18 DIAGNOSIS — R339 Retention of urine, unspecified: Secondary | ICD-10-CM | POA: Diagnosis not present

## 2017-01-18 DIAGNOSIS — T83511D Infection and inflammatory reaction due to indwelling urethral catheter, subsequent encounter: Secondary | ICD-10-CM | POA: Diagnosis not present

## 2017-01-18 DIAGNOSIS — I482 Chronic atrial fibrillation: Secondary | ICD-10-CM | POA: Diagnosis not present

## 2017-01-25 DIAGNOSIS — I482 Chronic atrial fibrillation: Secondary | ICD-10-CM | POA: Diagnosis not present

## 2017-01-25 DIAGNOSIS — Z466 Encounter for fitting and adjustment of urinary device: Secondary | ICD-10-CM | POA: Diagnosis not present

## 2017-01-25 DIAGNOSIS — E890 Postprocedural hypothyroidism: Secondary | ICD-10-CM | POA: Diagnosis not present

## 2017-01-25 DIAGNOSIS — I5022 Chronic systolic (congestive) heart failure: Secondary | ICD-10-CM | POA: Diagnosis not present

## 2017-01-25 DIAGNOSIS — T83511D Infection and inflammatory reaction due to indwelling urethral catheter, subsequent encounter: Secondary | ICD-10-CM | POA: Diagnosis not present

## 2017-01-25 DIAGNOSIS — N401 Enlarged prostate with lower urinary tract symptoms: Secondary | ICD-10-CM | POA: Diagnosis not present

## 2017-01-25 DIAGNOSIS — G309 Alzheimer's disease, unspecified: Secondary | ICD-10-CM | POA: Diagnosis not present

## 2017-01-25 DIAGNOSIS — F0280 Dementia in other diseases classified elsewhere without behavioral disturbance: Secondary | ICD-10-CM | POA: Diagnosis not present

## 2017-01-25 DIAGNOSIS — R339 Retention of urine, unspecified: Secondary | ICD-10-CM | POA: Diagnosis not present

## 2017-01-28 DIAGNOSIS — N401 Enlarged prostate with lower urinary tract symptoms: Secondary | ICD-10-CM | POA: Diagnosis not present

## 2017-01-28 DIAGNOSIS — E890 Postprocedural hypothyroidism: Secondary | ICD-10-CM | POA: Diagnosis not present

## 2017-01-28 DIAGNOSIS — Z466 Encounter for fitting and adjustment of urinary device: Secondary | ICD-10-CM | POA: Diagnosis not present

## 2017-01-28 DIAGNOSIS — F0280 Dementia in other diseases classified elsewhere without behavioral disturbance: Secondary | ICD-10-CM | POA: Diagnosis not present

## 2017-01-28 DIAGNOSIS — T83511D Infection and inflammatory reaction due to indwelling urethral catheter, subsequent encounter: Secondary | ICD-10-CM | POA: Diagnosis not present

## 2017-01-28 DIAGNOSIS — I5022 Chronic systolic (congestive) heart failure: Secondary | ICD-10-CM | POA: Diagnosis not present

## 2017-01-28 DIAGNOSIS — R339 Retention of urine, unspecified: Secondary | ICD-10-CM | POA: Diagnosis not present

## 2017-01-28 DIAGNOSIS — I482 Chronic atrial fibrillation: Secondary | ICD-10-CM | POA: Diagnosis not present

## 2017-01-28 DIAGNOSIS — G309 Alzheimer's disease, unspecified: Secondary | ICD-10-CM | POA: Diagnosis not present

## 2017-02-01 DIAGNOSIS — G309 Alzheimer's disease, unspecified: Secondary | ICD-10-CM | POA: Diagnosis not present

## 2017-02-01 DIAGNOSIS — F0280 Dementia in other diseases classified elsewhere without behavioral disturbance: Secondary | ICD-10-CM | POA: Diagnosis not present

## 2017-02-01 DIAGNOSIS — T83511D Infection and inflammatory reaction due to indwelling urethral catheter, subsequent encounter: Secondary | ICD-10-CM | POA: Diagnosis not present

## 2017-02-01 DIAGNOSIS — N401 Enlarged prostate with lower urinary tract symptoms: Secondary | ICD-10-CM | POA: Diagnosis not present

## 2017-02-01 DIAGNOSIS — Z466 Encounter for fitting and adjustment of urinary device: Secondary | ICD-10-CM | POA: Diagnosis not present

## 2017-02-01 DIAGNOSIS — I482 Chronic atrial fibrillation: Secondary | ICD-10-CM | POA: Diagnosis not present

## 2017-02-01 DIAGNOSIS — E890 Postprocedural hypothyroidism: Secondary | ICD-10-CM | POA: Diagnosis not present

## 2017-02-01 DIAGNOSIS — R339 Retention of urine, unspecified: Secondary | ICD-10-CM | POA: Diagnosis not present

## 2017-02-01 DIAGNOSIS — I5022 Chronic systolic (congestive) heart failure: Secondary | ICD-10-CM | POA: Diagnosis not present

## 2017-02-19 ENCOUNTER — Ambulatory Visit (INDEPENDENT_AMBULATORY_CARE_PROVIDER_SITE_OTHER): Payer: Medicare HMO | Admitting: Urology

## 2017-02-19 DIAGNOSIS — R1032 Left lower quadrant pain: Secondary | ICD-10-CM | POA: Diagnosis not present

## 2017-02-19 DIAGNOSIS — R3914 Feeling of incomplete bladder emptying: Secondary | ICD-10-CM

## 2017-02-19 DIAGNOSIS — N401 Enlarged prostate with lower urinary tract symptoms: Secondary | ICD-10-CM

## 2017-03-11 ENCOUNTER — Encounter (INDEPENDENT_AMBULATORY_CARE_PROVIDER_SITE_OTHER): Payer: Self-pay | Admitting: Internal Medicine

## 2017-03-11 ENCOUNTER — Ambulatory Visit (INDEPENDENT_AMBULATORY_CARE_PROVIDER_SITE_OTHER): Payer: Medicare HMO | Admitting: Internal Medicine

## 2017-03-11 VITALS — BP 104/62 | HR 56 | Temp 98.0°F | Ht 61.0 in | Wt 109.4 lb

## 2017-03-11 DIAGNOSIS — K5900 Constipation, unspecified: Secondary | ICD-10-CM

## 2017-03-11 NOTE — Progress Notes (Signed)
   Subjective:    Patient ID: Jeffrey EdelsonRoy C Aggarwal, male    DOB: [birth year], 84 y.o.   MRN: [id number] 61, wt 106 HPI Here today for f/u. Last seen In December for abdominal pain (epigastric).  Pain x 5-6 months. Pain was not constant.  In December he was admitted to observation for a UTI. Discharged home with Cipro and Flagyl.   At OV he was starteed on Protonix 40mg  daily.  Has a permanent catheter. Hx of UTI.  He sometimes has lower abdominal pain when he is constipated. He says he has trouble with BMs.     12/31/2016: CT scan revealed IMPRESSION: Mild bladder wall thickening with perivesical stranding, likely corresponding to the patient's known UTI. Indwelling Foley catheter. Small bilateral pleural effusions, left greater than right. Additional stable ancillary findings as above.  Review of Systems     Objective:   Physical Exam Blood pressure 104/62, pulse (!) 56, temperature 98 F (36.7 C), height 5\' 1"  (1.549 m), weight 109 lb 6.4 oz (49.6 kg). Alert and oriented. Skin warm and dry. Oral mucosa is moist.   . Sclera anicteric, conjunctivae is pink. Thyroid not enlarged. No cervical lymphadenopathy. Lungs clear. Heart regular rate and rhythm.  Abdomen is soft. Bowel sounds are positive. No hepatomegaly. No abdominal masses felt. No tenderness.  No edema to lower extremities.  Foley in place. No impaction felt.           Assessment & Plan:  Abdominal pain related to constipation. Take the MIralax 1/2 scoop daily. OV in 6 months.

## 2017-03-11 NOTE — Patient Instructions (Signed)
1/2 scoop of the Miralax daily. OV in 6 months

## 2017-03-20 ENCOUNTER — Ambulatory Visit (INDEPENDENT_AMBULATORY_CARE_PROVIDER_SITE_OTHER): Payer: Medicare HMO | Admitting: Cardiology

## 2017-03-20 ENCOUNTER — Ambulatory Visit (HOSPITAL_COMMUNITY)
Admission: RE | Admit: 2017-03-20 | Discharge: 2017-03-20 | Disposition: A | Discharge status: Home / Self Care | Payer: Medicare HMO | Source: Ambulatory Visit | Attending: Cardiology | Admitting: Cardiology

## 2017-03-20 ENCOUNTER — Other Ambulatory Visit (HOSPITAL_COMMUNITY)
Admission: RE | Admit: 2017-03-20 | Discharge: 2017-03-20 | Disposition: A | Discharge status: Home / Self Care | Payer: Medicare HMO | Source: Ambulatory Visit | Attending: Cardiology | Admitting: Cardiology

## 2017-03-20 ENCOUNTER — Encounter: Payer: Self-pay | Admitting: Cardiology

## 2017-03-20 VITALS — BP 114/62 | HR 70 | Ht 61.0 in | Wt 113.4 lb

## 2017-03-20 DIAGNOSIS — I5033 Acute on chronic diastolic (congestive) heart failure: Secondary | ICD-10-CM | POA: Diagnosis not present

## 2017-03-20 DIAGNOSIS — I517 Cardiomegaly: Secondary | ICD-10-CM | POA: Insufficient documentation

## 2017-03-20 DIAGNOSIS — I4891 Unspecified atrial fibrillation: Secondary | ICD-10-CM

## 2017-03-20 DIAGNOSIS — I10 Essential (primary) hypertension: Secondary | ICD-10-CM

## 2017-03-20 DIAGNOSIS — R0602 Shortness of breath: Secondary | ICD-10-CM

## 2017-03-20 DIAGNOSIS — R918 Other nonspecific abnormal finding of lung field: Secondary | ICD-10-CM | POA: Diagnosis not present

## 2017-03-20 DIAGNOSIS — I350 Nonrheumatic aortic (valve) stenosis: Secondary | ICD-10-CM | POA: Diagnosis not present

## 2017-03-20 LAB — COMPREHENSIVE METABOLIC PANEL
ALK PHOS: 51 U/L (ref 38–126)
ALT: 70 U/L — AB (ref 17–63)
AST: 58 U/L — AB (ref 15–41)
Albumin: 3.7 g/dL (ref 3.5–5.0)
Anion gap: 11 (ref 5–15)
BILIRUBIN TOTAL: 0.7 mg/dL (ref 0.3–1.2)
BUN: 44 mg/dL — AB (ref 6–20)
CALCIUM: 9.2 mg/dL (ref 8.9–10.3)
CHLORIDE: 110 mmol/L (ref 101–111)
CO2: 22 mmol/L (ref 22–32)
CREATININE: 1.85 mg/dL — AB (ref 0.61–1.24)
GFR, EST AFRICAN AMERICAN: 35 mL/min — AB (ref >60)
GFR, EST NON AFRICAN AMERICAN: 30 mL/min — AB (ref >60)
Glucose, Bld: 62 mg/dL — ABNORMAL LOW (ref 65–99)
Potassium: 4.4 mmol/L (ref 3.5–5.1)
Sodium: 143 mmol/L (ref 135–145)
TOTAL PROTEIN: 7.3 g/dL (ref 6.5–8.1)

## 2017-03-20 LAB — CBC WITH DIFFERENTIAL/PLATELET
BASOS PCT: 1 %
Basophils Absolute: 0 10*3/uL (ref 0.0–0.1)
EOS ABS: 0.2 10*3/uL (ref 0.0–0.7)
EOS PCT: 2 %
HCT: 35.5 % — ABNORMAL LOW (ref 39.0–52.0)
HEMOGLOBIN: 11.2 g/dL — AB (ref 13.0–17.0)
Lymphocytes Relative: 14 %
Lymphs Abs: 1.2 10*3/uL (ref 0.7–4.0)
MCH: 27.7 pg (ref 26.0–34.0)
MCHC: 31.5 g/dL (ref 30.0–36.0)
MCV: 87.7 fL (ref 78.0–100.0)
Monocytes Absolute: 0.8 10*3/uL (ref 0.1–1.0)
Monocytes Relative: 9 %
NEUTROS PCT: 74 %
Neutro Abs: 6.5 10*3/uL (ref 1.7–7.7)
PLATELETS: 334 10*3/uL (ref 150–400)
RBC: 4.05 MIL/uL — AB (ref 4.22–5.81)
RDW: 18.7 % — ABNORMAL HIGH (ref 11.5–15.5)
WBC: 8.8 10*3/uL (ref 4.0–10.5)

## 2017-03-20 LAB — BRAIN NATRIURETIC PEPTIDE: B NATRIURETIC PEPTIDE 5: 1680 pg/mL — AB (ref 0.0–100.0)

## 2017-03-20 MED ORDER — FUROSEMIDE 20 MG PO TABS: 40.0000 mg | ORAL_TABLET | Freq: Every day | ORAL | 11 refills | Status: DC

## 2017-03-20 NOTE — Patient Instructions (Signed)
Medication Instructions:  INCREASE LASIX TO 40 MG DAILY   Labwork: TODAY  CMET CBC BNP  Testing/Procedures: A chest x-ray takes a picture of the organs and structures inside the chest, including the heart, lungs, and blood vessels. This test can show several things, including, whether the heart is enlarges; whether fluid is building up in the lungs; and whether pacemaker / defibrillator leads are still in place.    Follow-Up: Your physician recommends that you schedule a follow-up appointment in: Monday/TUESDAY    Any Other Special Instructions Will Be Listed Below (If Applicable).     If you need a refill on your cardiac medications before your next appointment, please call your pharmacy.

## 2017-03-20 NOTE — Progress Notes (Signed)
Clinical Summary [name] is a 84 y.o.male seen today for follow up of the following medical problems.   1. Afib  - blood pressure has had difficulty tolerating beta blockers or CCBs during prior admission, he has actually been only on dig 0.125 mg every other day and doing well for quite some time - CHADS2 score of 3, he is on eliquis - he remains active. He enjoys [place] "the barn" to listen to country music.   - no recent palpitations. Compliant with meds  2. HTN  - compliant with meds  3. Mild to moderate aortic stenosis  - 04/2015 echo moderate AS. AVA VTI 1.14, mean grad 25 - poorly visuazlied on most recent echo, mean gradient of 21 mmHg suggestive of moderate but technically limited.   - recent increase in SOB over last 3-4 days. He denies any LE edema, no chest pain.    4. Diastolic dysfunction  - grade 2 on most recent echo  - recent increase in SOB over the last 3-4 days.    5. Urinary retention - biggest issue recently, has had intermittent foley catheters. Can develop some fluid overload with leg swelling and SOB that resolves with foley placement - followed by urology.  - recent UTIs per family report  Past Medical History:  Diagnosis Date  . Atrial fibrillation (HCC)    on Eliquis  . Elevated PSA   . Hypertension    no longer on medication  . Hypothyroid   . Insomnia   . Kidney stone   . Polymyalgia rheumatica (HCC)    family is not aware of this diagnosis  . Pressure ulcer   . Reflux      Allergies  Allergen Reactions  . Cardura [Doxazosin Mesylate]      Current Outpatient Medications  Medication Sig Dispense Refill  . DIGOX 125 MCG tablet take 1 tablet by mouth every other day 45 tablet 1  . ELIQUIS 2.5 MG TABS tablet take 1 tablet by mouth twice a day 90 tablet 3  . esomeprazole (NEXIUM) 40 MG capsule take 1 capsule by mouth once daily (Patient not taking: Reported on 03/11/2017) 90 capsule 1  . feeding supplement,  ENSURE COMPLETE, (ENSURE COMPLETE) LIQD Take 237 mLs by mouth 2 (two) times daily between meals. 30 Bottle 1  . furosemide (LASIX) 20 MG tablet Take 1 tablet (20 mg total) by mouth daily. 30 tablet 11  . levothyroxine (SYNTHROID, LEVOTHROID) 75 MCG tablet Take 1 tablet (75 mcg total) by mouth daily before breakfast. 30 tablet 12  . pantoprazole (PROTONIX) 40 MG tablet Take 1 tablet (40 mg total) by mouth daily. 30 tablet 5  . psyllium (HYDROCIL/METAMUCIL) 95 % PACK Take 1 packet by mouth daily as needed for mild constipation.    . tamsulosin (FLOMAX) 0.4 MG CAPS capsule Take 1 capsule (0.4 mg total) by mouth daily. 30 capsule 6  . triamcinolone ointment (KENALOG) 0.1 % apply to affected area twice a day 60 g 3   No current facility-administered medications for this visit.      Past Surgical History:  Procedure Laterality Date  . APPENDECTOMY    . CHOLECYSTECTOMY    . THYROIDECTOMY    . TONSILLECTOMY       Allergies  Allergen Reactions  . Cardura [Doxazosin Mesylate]       Family History  Problem Relation Age of Onset  . Heart attack Father      Social History [name] that  has never smoked. he has never used smokeless tobacco. [name] that he does not drink alcohol.   Review of Systems CONSTITUTIONAL: visibly SOB HEENT: Eyes: No visual loss, blurred vision, double vision or yellow sclerae.No hearing loss, sneezing, congestion, runny nose or sore throat.  SKIN: No rash or itching.  CARDIOVASCULAR: per hpi RESPIRATORY: per hpi GASTROINTESTINAL: No anorexia, nausea, vomiting or diarrhea. No abdominal pain or blood.  GENITOURINARY: No burning on urination, no polyuria NEUROLOGICAL: No headache, dizziness, syncope, paralysis, ataxia, numbness or tingling in the extremities. No change in bowel or bladder control.  MUSCULOSKELETAL: No muscle, back pain, joint pain or stiffness.  LYMPHATICS: No enlarged nodes. No history of splenectomy.  PSYCHIATRIC:  No history of depression or anxiety.  ENDOCRINOLOGIC: No reports of sweating, cold or heat intolerance. No polyuria or polydipsia.  Marland Kitchen   Physical Examination Vitals:   03/20/17 1338  BP: 114/62  Pulse: 70  SpO2: 95%   Vitals:   03/20/17 1338  Weight: 113 lb 6.4 oz (51.4 kg)  Height: 5\' 1"  (1.549 m)    Gen: resting comfortably, no acute distress HEENT: no scleral icterus, pupils equal round and reactive, no palptable cervical adenopathy,  CV: RRR, 3/6 systolic murmur rusb, +JVD Resp: +bilateral crackles GI: abdomen is soft, non-tender, non-distended, normal bowel sounds, no hepatosplenomegaly MSK: extremities are warm, no edema.  Skin: warm, no rash Neuro:  no focal deficits Psych: appropriate affect   Diagnostic Studies  01/2013 Echo LVEF 55-60%, akinesis basalinferolateral myocardium, grade II diastolic dysfunction,    04/2015 echo Study Conclusions  - Left ventricle: The cavity size was normal. There was focal basal hypertrophy. Systolic function was normal. The estimated ejection fraction was in the range of 55% to 60%. Doppler parameters are consistent with abnormal left ventricular relaxation (grade 1 diastolic dysfunction). - Aortic valve: Moderately calcified annulus. Trileaflet; moderately thickened leaflets. There was moderate stenosis. There was mild to moderate regurgitation. Valve area (VTI): 1.14 cm^2. Valve area (Vmax): 1.18 cm^2. Valve area (Vmean): 0.88 cm^2. - Mitral valve: There was mild to moderate regurgitation. - Left atrium: The atrium was severely dilated. - Right atrium: The atrium was moderately dilated. - Technically adequate study.   Assessment and Plan   1. Afib  - He has been managed on every other day digoxin due to soft bp's on beta blockers and CCBs.  - CHADS2Vasc score is 3, he will continue anticoagulation  -no symptoms, EKG today shows rate controlled afib, continue current meds  2. HTN  - at goal,  continue current meds   3. Aortic stenosis  - repeat echo  4. Acute on chronic diastolic HF - visibly SOB in clinic today. Elevated JVD. We will order stat CXR, CMET, CBC, BNP. Clinic weights up from 109 to 113 lbs today.  - we will increase his lasix to 40mg  daily.     Close f/u next week.   Antoine Poche, M.D.

## 2017-03-21 ENCOUNTER — Encounter: Payer: Self-pay | Admitting: Cardiology

## 2017-03-21 ENCOUNTER — Telehealth: Payer: Self-pay | Admitting: *Deleted

## 2017-03-21 DIAGNOSIS — I350 Nonrheumatic aortic (valve) stenosis: Secondary | ICD-10-CM

## 2017-03-21 NOTE — Telephone Encounter (Signed)
-----   Message from Antoine PocheJonathan F Branch, MD sent at 03/21/2017  9:55 AM EST ----- CXR and labs show that he has built up fluid in his lungs as the cause of his SOB. Should improve with increase dose of lasix we started. If symptoms worsen please have them contract use, otherwise he should f/u next week with PA. He also needs an echo for early next week for aortic stenosis   Dina RichJonathan Branch MD

## 2017-03-22 NOTE — Progress Notes (Signed)
Cardiology Office Note    Date:  03/25/2017   ID:  RODOLFO GASTER, DOB Jun 30, 1921, MRN [id number]  PCP:  [name], MD  Cardiologist: Dina Rich, MD    Chief Complaint  Patient presents with  . Follow-up    1 week visit    History of Present Illness:    KERRI ASCHE is a 84 y.o. male with past medical history of chronic diastolic CHF, PAF (on Eliquis), moderate AS, Stage 3 CKD and HTN who presents to the office today for 1-week follow-up.   He was recently evaluated by Dr. Wyline Mood on 03/20/2017 and reported worsening dyspnea on exertion over the past 3-4 days.  He was noted to have elevated JVD on examination on the rales bilaterally and a CXR along with lab work was obtained at that time. Weight was 113 lbs and Lasix was increased to 40mg  daily. CXR on 2/20 showed chronic but increased pulmonary interstitial opacity and acute pulmonary edema.  BNP was elevated to 1680 with creatinine at 1.85 (baseline 1.4-1.5).  In talking with the patient today, he reports improvement in his breathing since his last office visit. Most history is provided by his two daughters who are present during today's visit. They note significant improvement in his breathing with exertion and also when lying down at night. He has not checked his weight at home but it has declined from 113 lbs at the time of his last office visit to 109 lbs today. They report he has made some changes in his diet and is no longer consuming country ham on a daily basis.  He denies any recent chest pain or palpitations. No fever, chills, or productive cough. He remains on Eliquis for anticoagulation and denies any evidence of active bleeding.   Past Medical History:  Diagnosis Date  . Aortic stenosis    a. moderate AS by echo in 04/2015  . Atrial fibrillation (HCC)    on Eliquis  . Elevated PSA   . Hypertension    no longer on medication  . Hypothyroid   . Insomnia   . Kidney stone   . Polymyalgia rheumatica  (HCC)    family is not aware of this diagnosis  . Pressure ulcer   . Reflux     Past Surgical History:  Procedure Laterality Date  . APPENDECTOMY    . CHOLECYSTECTOMY    . THYROIDECTOMY    . TONSILLECTOMY      Current Medications: Outpatient Medications Prior to Visit  Medication Sig Dispense Refill  . DIGOX 125 MCG tablet take 1 tablet by mouth every other day 45 tablet 1  . ELIQUIS 2.5 MG TABS tablet take 1 tablet by mouth twice a day 90 tablet 3  . feeding supplement, ENSURE COMPLETE, (ENSURE COMPLETE) LIQD Take 237 mLs by mouth 2 (two) times daily between meals. 30 Bottle 1  . furosemide (LASIX) 20 MG tablet Take 2 tablets (40 mg total) by mouth daily. 60 tablet 11  . levothyroxine (SYNTHROID, LEVOTHROID) 75 MCG tablet Take 1 tablet (75 mcg total) by mouth daily before breakfast. 30 tablet 12  . pantoprazole (PROTONIX) 40 MG tablet Take 1 tablet (40 mg total) by mouth daily. 30 tablet 5  . psyllium (HYDROCIL/METAMUCIL) 95 % PACK Take 1 packet by mouth daily as needed for mild constipation.    . triamcinolone ointment (KENALOG) 0.1 % apply to affected area twice a day 60 g 3  . tamsulosin (FLOMAX) 0.4 MG CAPS capsule Take 1  capsule (0.4 mg total) by mouth daily. 30 capsule 6   No facility-administered medications prior to visit.      Allergies:   Cardura [doxazosin mesylate]   Social History   Socioeconomic History  . Marital status: Widowed    Spouse name: None  . Number of children: None  . Years of education: None  . Highest education level: None  Social Needs  . Financial resource strain: None  . Food insecurity - worry: None  . Food insecurity - inability: None  . Transportation needs - medical: None  . Transportation needs - non-medical: None  Occupational History  . None  Tobacco Use  . Smoking status: Never Smoker  . Smokeless tobacco: Never Used  Substance and Sexual Activity  . Alcohol use: No    Alcohol/week: 0.0 oz  . Drug use: No  . Sexual  activity: None  Other Topics Concern  . None  Social History Narrative  . None     Family History:  The patient's family history includes Heart attack in his father.   Review of Systems:   Please see the history of present illness.     General:  No chills, fever, night sweats or weight changes.  Cardiovascular:  No chest pain, palpitations, paroxysmal nocturnal dyspnea. Positive for edema and orthopnea (improving).  Dermatological: No rash, lesions/masses Respiratory: No cough, dyspnea Urologic: No hematuria, dysuria Abdominal:   No nausea, vomiting, diarrhea, bright red blood per rectum, melena, or hematemesis Neurologic:  No visual changes, wkns, changes in mental status. All other systems reviewed and are otherwise negative except as noted above.   Physical Exam:    VS:  BP 114/62   Pulse 65   Ht 5\' 1"  (1.549 m)   Wt 109 lb 3.2 oz (49.5 kg)   SpO2 92%   BMI 20.63 kg/m    General: Well developed, elderly Caucasian male appearing in no acute distress. Head: Normocephalic, atraumatic, sclera non-icteric, no xanthomas, nares are without discharge.  Neck: No carotid bruits. JVD at 8cm. Lungs: Respirations regular and unlabored, rales along right base. Heart: Irregularly irregular. No S3 or S4.  No rubs or gallops appreciated. 3/6 SEM along RUSB.  Abdomen: Soft, non-tender, non-distended with normoactive bowel sounds. No hepatomegaly. No rebound/guarding. No obvious abdominal masses. Msk:  Strength and tone appear normal for age. No joint deformities or effusions. Extremities: No clubbing or cyanosis. No lower extremity edema.  Distal pedal pulses are 2+ bilaterally. Neuro: Alert and oriented X 3. Moves all extremities spontaneously. No focal deficits noted. Psych:  Responds to questions appropriately with a normal affect. Skin: No rashes or lesions noted  Wt Readings from Last 3 Encounters:  03/25/17 109 lb 3.2 oz (49.5 kg)  03/20/17 113 lb 6.4 oz (51.4 kg)  03/11/17 109  lb 6.4 oz (49.6 kg)      Studies/Labs Reviewed:   EKG:  EKG is not ordered today.   Recent Labs: 09/25/2016: Magnesium 0.7 12/31/2016: TSH 5.232 03/20/2017: ALT 70; B Natriuretic Peptide 1,680.0; Hemoglobin 11.2; Platelets 334 03/25/2017: BUN 40; Creatinine, Ser 1.69; Potassium 4.6; Sodium 141   Lipid Panel    Component Value Date/Time   CHOL 207 (H) 11/21/2015 1004   TRIG 70 11/21/2015 1004   HDL 66 11/21/2015 1004   CHOLHDL 3.1 11/21/2015 1004   LDLCALC 127 (H) 11/21/2015 1004    Additional studies/ records that were reviewed today include:   Echocardiogram: 04/2015 Study Conclusions  - Left ventricle: The cavity size was normal. There  was focal basal   hypertrophy. Systolic function was normal. The estimated ejection   fraction was in the range of 55% to 60%. Doppler parameters are   consistent with abnormal left ventricular relaxation (grade 1   diastolic dysfunction). - Aortic valve: Moderately calcified annulus. Trileaflet;   moderately thickened leaflets. There was moderate stenosis. There   was mild to moderate regurgitation. Valve area (VTI): 1.14 cm^2.   Valve area (Vmax): 1.18 cm^2. Valve area (Vmean): 0.88 cm^2. - Mitral valve: There was mild to moderate regurgitation. - Left atrium: The atrium was severely dilated. - Right atrium: The atrium was moderately dilated. - Technically adequate study.   Assessment:    1. Acute on chronic diastolic heart failure (HCC)   2. Persistent atrial fibrillation (HCC)   3. Aortic valve stenosis, etiology of cardiac valve disease unspecified   4. Essential hypertension      Plan:   In order of problems listed above:  1. Acute on Chronic Diastolic CHF - recently evaluated by Dr. Wyline Mood for worsening dyspnea on exertion and found to have an elevated BNP of 1680 with CXR showing increased pulmonary interstitial opacity and acute pulmonary edema. Lasix was increased from 20 to 40mg  daily.  - He has noted significant  improvement in his breathing and weight is down 4 pounds since his last office visit. He still exhibits evidence of volume overload on examination though, but has improved. Will continue on Lasix 40mg  daily at this time. Recheck BNP and BMET today. We discussed sodium restriction today, as he had previously been consuming country ham and sausage on a daily basis. He has reduced this to a few days per week and will focus on moderation of sodium intake.   2. Persistent Atrial Fibrillation - He denies any recent palpitations. Heart rate is well controlled in the 60's during today's visit. Remains on Digoxin 0.125 mg every other day.  - He denies any evidence of active bleeding. Continues on Eliquis 2.5 mg twice daily for anticoagulation.  3. Aortic Stenosis - Moderate by echocardiogram in 04/2015. He had a repeat echocardiogram performed earlier this morning yet the official report is not yet available. We will follow-up with the patient and his family regarding results. - the family is very clear in stating that do not wish to pursue aggressive measures if in fact his AS has worsened.   4. HTN - BP is well controlled at 114/62 during today's visit.  - diet-controlled and no longer on medication for this.    Medication Adjustments/Labs and Tests Ordered: Current medicines are reviewed at length with the patient today.  Concerns regarding medicines are outlined above.  Medication changes, Labs and Tests ordered today are listed in the Patient Instructions below. Patient Instructions  Medication Instructions:  Your physician recommends that you continue on your current medications as directed. Please refer to the Current Medication list given to you today.   Labwork: Your physician recommends that you return for lab work Today   Testing/Procedures: NONE   Follow-Up: Your physician recommends that you schedule a follow-up appointment in: 3-4 Weeks  Any Other Special Instructions Will Be  Listed Below (If Applicable).  If you need a refill on your cardiac medications before your next appointment, please call your pharmacy.  Thank you for choosing [facility]!    Signed, Ellsworth Lennox, PA-C  03/25/2017 1:16 PM    [facility] [address] [place], [place] [address] Phone: [phone]

## 2017-03-25 ENCOUNTER — Other Ambulatory Visit (HOSPITAL_COMMUNITY)
Admission: RE | Admit: 2017-03-25 | Discharge: 2017-03-25 | Disposition: A | Discharge status: Home / Self Care | Payer: Medicare HMO | Source: Ambulatory Visit | Attending: Student | Admitting: Student

## 2017-03-25 ENCOUNTER — Ambulatory Visit (HOSPITAL_COMMUNITY)
Admission: RE | Admit: 2017-03-25 | Discharge: 2017-03-25 | Disposition: A | Discharge status: Home / Self Care | Payer: Medicare HMO | Source: Ambulatory Visit | Attending: Cardiology | Admitting: Cardiology

## 2017-03-25 ENCOUNTER — Ambulatory Visit: Payer: Medicare HMO | Admitting: Student

## 2017-03-25 ENCOUNTER — Encounter: Payer: Self-pay | Admitting: Student

## 2017-03-25 VITALS — BP 114/62 | HR 65 | Ht 61.0 in | Wt 109.2 lb

## 2017-03-25 DIAGNOSIS — I4819 Other persistent atrial fibrillation: Secondary | ICD-10-CM

## 2017-03-25 DIAGNOSIS — I350 Nonrheumatic aortic (valve) stenosis: Secondary | ICD-10-CM | POA: Diagnosis not present

## 2017-03-25 DIAGNOSIS — I10 Essential (primary) hypertension: Secondary | ICD-10-CM | POA: Diagnosis not present

## 2017-03-25 DIAGNOSIS — I5032 Chronic diastolic (congestive) heart failure: Secondary | ICD-10-CM | POA: Diagnosis not present

## 2017-03-25 DIAGNOSIS — Z7901 Long term (current) use of anticoagulants: Secondary | ICD-10-CM | POA: Diagnosis not present

## 2017-03-25 DIAGNOSIS — I5033 Acute on chronic diastolic (congestive) heart failure: Secondary | ICD-10-CM | POA: Insufficient documentation

## 2017-03-25 DIAGNOSIS — I482 Chronic atrial fibrillation: Secondary | ICD-10-CM | POA: Diagnosis not present

## 2017-03-25 DIAGNOSIS — I481 Persistent atrial fibrillation: Secondary | ICD-10-CM

## 2017-03-25 DIAGNOSIS — I340 Nonrheumatic mitral (valve) insufficiency: Secondary | ICD-10-CM

## 2017-03-25 HISTORY — DX: Nonrheumatic mitral (valve) insufficiency: I34.0

## 2017-03-25 LAB — ECHOCARDIOGRAM COMPLETE
AOPV: 0.25 m/s
AV VEL mean LVOT/AV: 0.25
AV peak Index: 0.76
AV pk vel: 279 cm/s
AVAREAMEANV: 1.15 cm2
AVAREAMEANVIN: 0.77 cm2/m2
AVAREAVTI: 1.14 cm2
AVAREAVTIIND: 0.73 cm2/m2
AVG: 18 mmHg
AVPG: 31 mmHg
CHL CUP AV VALUE AREA INDEX: 0.73
CHL CUP AV VEL: 1.09
CHL CUP DOP CALC LVOT VTI: 15.3 cm
DOP CAL AO MEAN VELOCITY: 197 cm/s
EERAT: 23.7
EWDT: 148 ms
FS: 13 % — AB (ref 28–44)
IVS/LV PW RATIO, ED: 1.04
LA ID, A-P, ES: 43 mm
LA diam end sys: 43 mm
LA diam index: 2.88 cm/m2
LA vol A4C: 121 ml
LA vol: 105 mL
LAVOLIN: 70.4 mL/m2
LDCA: 4.52 cm2
LV E/e' medial: 23.7
LV E/e'average: 23.7
LV PW d: 11.4 mm — AB (ref 0.6–1.1)
LV TDI E'LATERAL: 5.57
LV TDI E'MEDIAL: 3.92
LV dias vol index: 77 mL/m2
LV e' LATERAL: 5.57 cm/s
LV sys vol index: 46 mL/m2
LV sys vol: 68 mL — AB
LVDIAVOL: 115 mL (ref 62–150)
LVOT SV: 69 mL
LVOT peak VTI: 0.24 cm
LVOT peak grad rest: 2 mmHg
LVOT peak vel: 70.5 cm/s
LVOTD: 24 mm
MV Dec: 148
MV Peak grad: 7 mmHg
MV pk A vel: 45.9 m/s
MVPKEVEL: 132 m/s
RV LATERAL S' VELOCITY: 7.9 cm/s
RV TAPSE: 11.4 mm
RV sys press: 38 mmHg
Reg peak vel: 297 cm/s
Simpson's disk: 41
Stroke v: 47 ml
TR max vel: 297 cm/s
VTI: 147 cm
VTI: 63.6 cm
Valve area: 1.09 cm2

## 2017-03-25 LAB — BASIC METABOLIC PANEL
Anion gap: 10 (ref 5–15)
BUN: 40 mg/dL — AB (ref 6–20)
CHLORIDE: 106 mmol/L (ref 101–111)
CO2: 25 mmol/L (ref 22–32)
Calcium: 9.2 mg/dL (ref 8.9–10.3)
Creatinine, Ser: 1.69 mg/dL — ABNORMAL HIGH (ref 0.61–1.24)
GFR calc Af Amer: 38 mL/min — ABNORMAL LOW (ref >60)
GFR calc non Af Amer: 33 mL/min — ABNORMAL LOW (ref >60)
GLUCOSE: 75 mg/dL (ref 65–99)
POTASSIUM: 4.6 mmol/L (ref 3.5–5.1)
Sodium: 141 mmol/L (ref 135–145)

## 2017-03-25 NOTE — Progress Notes (Signed)
*  PRELIMINARY RESULTS* Echocardiogram 2D Echocardiogram has been performed.  Stacey DrainWhite, Taron Mondor J 03/25/2017, 10:47 AM

## 2017-03-25 NOTE — Patient Instructions (Signed)
Medication Instructions:  Your physician recommends that you continue on your current medications as directed. Please refer to the Current Medication list given to you today.   Labwork: Your physician recommends that you return for lab work Today    Testing/Procedures: NONE   Follow-Up: Your physician recommends that you schedule a follow-up appointment in: 3-4 Weeks   Any Other Special Instructions Will Be Listed Below (If Applicable).     If you need a refill on your cardiac medications before your next appointment, please call your pharmacy.  Thank you for choosing [facility]!

## 2017-03-26 LAB — BRAIN NATRIURETIC PEPTIDE: B Natriuretic Peptide: 1144 pg/mL — ABNORMAL HIGH (ref 0.0–100.0)

## 2017-03-31 ENCOUNTER — Inpatient Hospital Stay (HOSPITAL_COMMUNITY)
Admission: EM | Admit: 2017-03-31 | Discharge: 2017-04-04 | DRG: 291 | Disposition: A | Discharge status: Home Health | Payer: Medicare HMO | Attending: Internal Medicine | Admitting: Internal Medicine

## 2017-03-31 ENCOUNTER — Encounter (HOSPITAL_COMMUNITY): Payer: Self-pay | Admitting: Emergency Medicine

## 2017-03-31 ENCOUNTER — Emergency Department (HOSPITAL_COMMUNITY): Payer: Medicare HMO

## 2017-03-31 ENCOUNTER — Other Ambulatory Visit: Payer: Self-pay

## 2017-03-31 DIAGNOSIS — I080 Rheumatic disorders of both mitral and aortic valves: Secondary | ICD-10-CM | POA: Diagnosis present

## 2017-03-31 DIAGNOSIS — J439 Emphysema, unspecified: Secondary | ICD-10-CM | POA: Diagnosis present

## 2017-03-31 DIAGNOSIS — R7989 Other specified abnormal findings of blood chemistry: Secondary | ICD-10-CM | POA: Diagnosis not present

## 2017-03-31 DIAGNOSIS — K7689 Other specified diseases of liver: Secondary | ICD-10-CM | POA: Diagnosis not present

## 2017-03-31 DIAGNOSIS — G4700 Insomnia, unspecified: Secondary | ICD-10-CM | POA: Diagnosis present

## 2017-03-31 DIAGNOSIS — I5021 Acute systolic (congestive) heart failure: Secondary | ICD-10-CM | POA: Diagnosis not present

## 2017-03-31 DIAGNOSIS — I130 Hypertensive heart and chronic kidney disease with heart failure and stage 1 through stage 4 chronic kidney disease, or unspecified chronic kidney disease: Secondary | ICD-10-CM | POA: Diagnosis not present

## 2017-03-31 DIAGNOSIS — I248 Other forms of acute ischemic heart disease: Secondary | ICD-10-CM | POA: Diagnosis present

## 2017-03-31 DIAGNOSIS — L89151 Pressure ulcer of sacral region, stage 1: Secondary | ICD-10-CM | POA: Diagnosis present

## 2017-03-31 DIAGNOSIS — Z8249 Family history of ischemic heart disease and other diseases of the circulatory system: Secondary | ICD-10-CM

## 2017-03-31 DIAGNOSIS — N390 Urinary tract infection, site not specified: Secondary | ICD-10-CM | POA: Diagnosis present

## 2017-03-31 DIAGNOSIS — M353 Polymyalgia rheumatica: Secondary | ICD-10-CM | POA: Diagnosis present

## 2017-03-31 DIAGNOSIS — I110 Hypertensive heart disease with heart failure: Secondary | ICD-10-CM | POA: Diagnosis not present

## 2017-03-31 DIAGNOSIS — I5043 Acute on chronic combined systolic (congestive) and diastolic (congestive) heart failure: Secondary | ICD-10-CM | POA: Diagnosis not present

## 2017-03-31 DIAGNOSIS — I5023 Acute on chronic systolic (congestive) heart failure: Secondary | ICD-10-CM | POA: Diagnosis not present

## 2017-03-31 DIAGNOSIS — I509 Heart failure, unspecified: Secondary | ICD-10-CM

## 2017-03-31 DIAGNOSIS — R740 Nonspecific elevation of levels of transaminase and lactic acid dehydrogenase [LDH]: Secondary | ICD-10-CM | POA: Diagnosis present

## 2017-03-31 DIAGNOSIS — I959 Hypotension, unspecified: Secondary | ICD-10-CM | POA: Diagnosis present

## 2017-03-31 DIAGNOSIS — R748 Abnormal levels of other serum enzymes: Secondary | ICD-10-CM | POA: Diagnosis not present

## 2017-03-31 DIAGNOSIS — N183 Chronic kidney disease, stage 3 (moderate): Secondary | ICD-10-CM | POA: Diagnosis not present

## 2017-03-31 DIAGNOSIS — R972 Elevated prostate specific antigen [PSA]: Secondary | ICD-10-CM | POA: Diagnosis present

## 2017-03-31 DIAGNOSIS — I493 Ventricular premature depolarization: Secondary | ICD-10-CM | POA: Diagnosis not present

## 2017-03-31 DIAGNOSIS — Z7952 Long term (current) use of systemic steroids: Secondary | ICD-10-CM

## 2017-03-31 DIAGNOSIS — Z7901 Long term (current) use of anticoagulants: Secondary | ICD-10-CM

## 2017-03-31 DIAGNOSIS — I482 Chronic atrial fibrillation, unspecified: Secondary | ICD-10-CM | POA: Diagnosis present

## 2017-03-31 DIAGNOSIS — R945 Abnormal results of liver function studies: Secondary | ICD-10-CM

## 2017-03-31 DIAGNOSIS — N289 Disorder of kidney and ureter, unspecified: Secondary | ICD-10-CM

## 2017-03-31 DIAGNOSIS — K219 Gastro-esophageal reflux disease without esophagitis: Secondary | ICD-10-CM | POA: Diagnosis present

## 2017-03-31 DIAGNOSIS — Z79890 Hormone replacement therapy (postmenopausal): Secondary | ICD-10-CM | POA: Diagnosis not present

## 2017-03-31 DIAGNOSIS — L8990 Pressure ulcer of unspecified site, unspecified stage: Secondary | ICD-10-CM

## 2017-03-31 DIAGNOSIS — M6281 Muscle weakness (generalized): Secondary | ICD-10-CM | POA: Diagnosis not present

## 2017-03-31 DIAGNOSIS — Z9049 Acquired absence of other specified parts of digestive tract: Secondary | ICD-10-CM

## 2017-03-31 DIAGNOSIS — Z66 Do not resuscitate: Secondary | ICD-10-CM | POA: Diagnosis present

## 2017-03-31 DIAGNOSIS — Z87442 Personal history of urinary calculi: Secondary | ICD-10-CM | POA: Diagnosis not present

## 2017-03-31 DIAGNOSIS — H9190 Unspecified hearing loss, unspecified ear: Secondary | ICD-10-CM | POA: Diagnosis present

## 2017-03-31 DIAGNOSIS — Z79899 Other long term (current) drug therapy: Secondary | ICD-10-CM

## 2017-03-31 DIAGNOSIS — Z888 Allergy status to other drugs, medicaments and biological substances status: Secondary | ICD-10-CM

## 2017-03-31 DIAGNOSIS — R778 Other specified abnormalities of plasma proteins: Secondary | ICD-10-CM | POA: Diagnosis present

## 2017-03-31 DIAGNOSIS — E876 Hypokalemia: Secondary | ICD-10-CM | POA: Diagnosis present

## 2017-03-31 DIAGNOSIS — R1310 Dysphagia, unspecified: Secondary | ICD-10-CM | POA: Diagnosis present

## 2017-03-31 DIAGNOSIS — F0390 Unspecified dementia without behavioral disturbance: Secondary | ICD-10-CM | POA: Diagnosis present

## 2017-03-31 DIAGNOSIS — R011 Cardiac murmur, unspecified: Secondary | ICD-10-CM | POA: Diagnosis present

## 2017-03-31 DIAGNOSIS — J449 Chronic obstructive pulmonary disease, unspecified: Secondary | ICD-10-CM | POA: Diagnosis not present

## 2017-03-31 DIAGNOSIS — R0602 Shortness of breath: Secondary | ICD-10-CM | POA: Diagnosis not present

## 2017-03-31 HISTORY — DX: Emphysema, unspecified: J43.9

## 2017-03-31 HISTORY — DX: Nonrheumatic mitral (valve) insufficiency: I34.0

## 2017-03-31 LAB — CBC WITH DIFFERENTIAL/PLATELET
BASOS ABS: 0 10*3/uL (ref 0.0–0.1)
Basophils Relative: 0 %
EOS PCT: 0 %
Eosinophils Absolute: 0 10*3/uL (ref 0.0–0.7)
HCT: 35.9 % — ABNORMAL LOW (ref 39.0–52.0)
Hemoglobin: 11.7 g/dL — ABNORMAL LOW (ref 13.0–17.0)
LYMPHS PCT: 12 %
Lymphs Abs: 1 10*3/uL (ref 0.7–4.0)
MCH: 28.3 pg (ref 26.0–34.0)
MCHC: 32.6 g/dL (ref 30.0–36.0)
MCV: 86.7 fL (ref 78.0–100.0)
MONO ABS: 0.9 10*3/uL (ref 0.1–1.0)
Monocytes Relative: 11 %
Neutro Abs: 6.2 10*3/uL (ref 1.7–7.7)
Neutrophils Relative %: 77 %
PLATELETS: 255 10*3/uL (ref 150–400)
RBC: 4.14 MIL/uL — ABNORMAL LOW (ref 4.22–5.81)
RDW: 18.1 % — AB (ref 11.5–15.5)
WBC: 8.1 10*3/uL (ref 4.0–10.5)

## 2017-03-31 LAB — URINALYSIS, ROUTINE W REFLEX MICROSCOPIC
Bacteria, UA: NONE SEEN
Bilirubin Urine: NEGATIVE
Glucose, UA: NEGATIVE mg/dL
Ketones, ur: NEGATIVE mg/dL
Nitrite: NEGATIVE
PH: 6 (ref 5.0–8.0)
Protein, ur: NEGATIVE mg/dL
SQUAMOUS EPITHELIAL / LPF: NONE SEEN
Specific Gravity, Urine: 1.006 (ref 1.005–1.030)

## 2017-03-31 LAB — COMPREHENSIVE METABOLIC PANEL
ALBUMIN: 3.7 g/dL (ref 3.5–5.0)
ALK PHOS: 64 U/L (ref 38–126)
ALT: 54 U/L (ref 17–63)
AST: 61 U/L — AB (ref 15–41)
Anion gap: 13 (ref 5–15)
BUN: 44 mg/dL — AB (ref 6–20)
CO2: 24 mmol/L (ref 22–32)
CREATININE: 1.72 mg/dL — AB (ref 0.61–1.24)
Calcium: 9.1 mg/dL (ref 8.9–10.3)
Chloride: 102 mmol/L (ref 101–111)
GFR calc Af Amer: 38 mL/min — ABNORMAL LOW (ref >60)
GFR calc non Af Amer: 33 mL/min — ABNORMAL LOW (ref >60)
GLUCOSE: 110 mg/dL — AB (ref 65–99)
POTASSIUM: 4.4 mmol/L (ref 3.5–5.1)
Sodium: 139 mmol/L (ref 135–145)
Total Bilirubin: 0.9 mg/dL (ref 0.3–1.2)
Total Protein: 7.4 g/dL (ref 6.5–8.1)

## 2017-03-31 LAB — TROPONIN I
TROPONIN I: 0.1 ng/mL — AB (ref <0.03)
Troponin I: 0.09 ng/mL (ref <0.03)

## 2017-03-31 LAB — LIPASE, BLOOD: LIPASE: 34 U/L (ref 11–51)

## 2017-03-31 LAB — BRAIN NATRIURETIC PEPTIDE: B NATRIURETIC PEPTIDE 5: 2093 pg/mL — AB (ref 0.0–100.0)

## 2017-03-31 LAB — DIGOXIN LEVEL: Digoxin Level: 1.3 ng/mL (ref 0.8–2.0)

## 2017-03-31 LAB — TSH: TSH: 1.733 u[IU]/mL (ref 0.350–4.500)

## 2017-03-31 LAB — MRSA PCR SCREENING: MRSA BY PCR: NEGATIVE

## 2017-03-31 MED ORDER — ACETAMINOPHEN 325 MG PO TABS
650.0000 mg | ORAL_TABLET | Freq: Four times a day (QID) | ORAL | Status: DC | PRN
Start: 2017-03-31 — End: 2017-04-04
  Administered 2017-03-31 – 2017-04-04 (×3): 650 mg via ORAL
  Filled 2017-03-31 (×3): qty 2

## 2017-03-31 MED ORDER — SODIUM CHLORIDE 0.9 % IV SOLN
250.0000 mL | INTRAVENOUS | Status: DC | PRN
  Administered 2017-03-31: 250 mL via INTRAVENOUS

## 2017-03-31 MED ORDER — FUROSEMIDE 10 MG/ML IJ SOLN
40.0000 mg | Freq: Every day | INTRAMUSCULAR | Status: DC
  Administered 2017-03-31 – 2017-04-01 (×2): 40 mg via INTRAVENOUS
  Filled 2017-03-31 (×2): qty 4

## 2017-03-31 MED ORDER — LORATADINE 10 MG PO TABS
10.0000 mg | ORAL_TABLET | Freq: Every day | ORAL | Status: DC
  Administered 2017-04-01 – 2017-04-04 (×4): 10 mg via ORAL
  Filled 2017-03-31 (×4): qty 1

## 2017-03-31 MED ORDER — PSYLLIUM 95 % PO PACK
1.0000 | PACK | Freq: Every day | ORAL | Status: DC | PRN
  Filled 2017-03-31: qty 1

## 2017-03-31 MED ORDER — FUROSEMIDE 10 MG/ML IJ SOLN
40.0000 mg | Freq: Once | INTRAMUSCULAR | Status: AC
  Administered 2017-03-31: 40 mg via INTRAVENOUS
  Filled 2017-03-31: qty 4

## 2017-03-31 MED ORDER — DIGOXIN 125 MCG PO TABS
125.0000 ug | ORAL_TABLET | ORAL | Status: DC
  Administered 2017-04-02 – 2017-04-04 (×2): 125 ug via ORAL
  Filled 2017-03-31 (×2): qty 1

## 2017-03-31 MED ORDER — APIXABAN 2.5 MG PO TABS
2.5000 mg | ORAL_TABLET | Freq: Two times a day (BID) | ORAL | Status: DC
  Administered 2017-03-31 – 2017-04-04 (×9): 2.5 mg via ORAL
  Filled 2017-03-31 (×11): qty 1

## 2017-03-31 MED ORDER — POTASSIUM CHLORIDE CRYS ER 20 MEQ PO TBCR
20.0000 meq | EXTENDED_RELEASE_TABLET | Freq: Every day | ORAL | Status: DC
  Administered 2017-04-01 – 2017-04-02 (×2): 20 meq via ORAL
  Filled 2017-03-31 (×3): qty 1

## 2017-03-31 MED ORDER — ACETAMINOPHEN 650 MG RE SUPP
650.0000 mg | Freq: Four times a day (QID) | RECTAL | Status: DC | PRN
Start: 2017-03-31 — End: 2017-04-04

## 2017-03-31 MED ORDER — ATORVASTATIN CALCIUM 40 MG PO TABS
80.0000 mg | ORAL_TABLET | Freq: Every day | ORAL | Status: AC
  Administered 2017-04-01: 80 mg via ORAL
  Filled 2017-03-31: qty 1
  Filled 2017-03-31: qty 2

## 2017-03-31 MED ORDER — MELATONIN 1 MG PO TABS
1.0000 mg | ORAL_TABLET | Freq: Every evening | ORAL | Status: DC | PRN
Start: 2017-03-31 — End: 2017-03-31

## 2017-03-31 MED ORDER — ASPIRIN 81 MG PO CHEW
324.0000 mg | CHEWABLE_TABLET | Freq: Once | ORAL | Status: AC
  Administered 2017-03-31: 324 mg via ORAL
  Filled 2017-03-31: qty 4

## 2017-03-31 MED ORDER — PANTOPRAZOLE SODIUM 40 MG PO TBEC
40.0000 mg | DELAYED_RELEASE_TABLET | Freq: Every day | ORAL | Status: DC
  Administered 2017-03-31 – 2017-04-04 (×5): 40 mg via ORAL
  Filled 2017-03-31 (×5): qty 1

## 2017-03-31 MED ORDER — SODIUM CHLORIDE 0.9% FLUSH: 3.0000 mL | INTRAVENOUS | Status: DC | PRN

## 2017-03-31 MED ORDER — ENSURE COMPLETE PO LIQD: 237.0000 mL | Freq: Two times a day (BID) | ORAL | Status: DC

## 2017-03-31 MED ORDER — METHOCARBAMOL 500 MG PO TABS
500.0000 mg | ORAL_TABLET | Freq: Four times a day (QID) | ORAL | Status: DC | PRN
  Administered 2017-03-31 – 2017-04-04 (×3): 500 mg via ORAL
  Filled 2017-03-31 (×4): qty 1

## 2017-03-31 MED ORDER — SODIUM CHLORIDE 0.9% FLUSH
3.0000 mL | Freq: Two times a day (BID) | INTRAVENOUS | Status: DC
  Administered 2017-03-31 – 2017-04-04 (×7): 3 mL via INTRAVENOUS

## 2017-03-31 MED ORDER — LEVOTHYROXINE SODIUM 75 MCG PO TABS
75.0000 ug | ORAL_TABLET | Freq: Every day | ORAL | Status: DC
  Administered 2017-04-01 – 2017-04-04 (×4): 75 ug via ORAL
  Filled 2017-03-31 (×6): qty 1

## 2017-03-31 MED ORDER — CEFTRIAXONE SODIUM 1 G IJ SOLR
1.0000 g | INTRAMUSCULAR | Status: DC
  Administered 2017-03-31 – 2017-04-01 (×2): 1 g via INTRAVENOUS
  Filled 2017-03-31: qty 10
  Filled 2017-03-31: qty 1
  Filled 2017-03-31 (×2): qty 10

## 2017-03-31 MED ORDER — ENSURE ENLIVE PO LIQD
237.0000 mL | Freq: Two times a day (BID) | ORAL | Status: DC
  Administered 2017-04-02 – 2017-04-04 (×3): 237 mL via ORAL

## 2017-03-31 NOTE — ED Triage Notes (Signed)
Pt's daughter reports increased SOB, fever, HA, and runny nose that began yesterday. Dyspnea at rest.

## 2017-03-31 NOTE — Progress Notes (Signed)
ANTICOAGULATION CONSULT NOTE - Follow Up Consult  Pharmacy Consult for eliquis Indication: atrial fibrillation  Allergies  Allergen Reactions  . Cardura [Doxazosin Mesylate]     Patient Measurements: Height: 5\' 1"  (154.9 cm) Weight: 109 lb (49.4 kg) IBW/kg (Calculated) : 52.3  Vital Signs: Temp: 98.1 F (36.7 C) (03/03 1534) Temp Source: Oral (03/03 1534) BP: 101/56 (03/03 1930) Pulse Rate: 39 (03/03 1930)  Labs: Recent Labs    03/31/17 1558  HGB 11.7*  HCT 35.9*  PLT 255  CREATININE 1.72*  TROPONINI 0.09*    Estimated Creatinine Clearance: 18.7 mL/min (A) (by C-G formula based on SCr of 1.72 mg/dL (H)).   Medications:  See medication history  Assessment: 84 yo man to continue apixaban for afib. Goal of Therapy:  prevention of stroke Monitor platelets by anticoagulation protocol: Yes   Plan:  Apixaban 2.5 mg bid Monitor for bleeding complications  Jeffrey Mccarty 03/31/2017,7:55 PM

## 2017-03-31 NOTE — H&P (Addendum)
TRH H&P   Patient Demographics:    Jeffrey Mccarty, is a 84 y.o. male  MRN: [id number]   DOB - [birth year]  Admit Date - 03/31/2017  Outpatient Primary MD for the patient is Gerda DissLuking, Vilinda BlanksWilliam S, MD  Referring MD/NP/PA: Loren Raceravid Yelverton  Outpatient Specialists:  Dr. Wyline MoodBranch (cardiology)  Patient coming from:  home  Chief Complaint  Patient presents with  . Shortness of Breath      HPI:    Jeffrey MillerRoy Mccarty  is a 84 y.o. male, w PMR, Copd, hypertension, Pafib, CHF (EF 40-45%), mild-mod AS, mod MR, apparently has had increase in lower ext edema starting about 2 weeks ago and his lasix was increased from 20mg  to 40mg  po qday by cardiology.  Pt today apparently was not feeling well, had c/o sore throat, dyspnea, slight cough, dry.  Slight epigastric discomfort,  No fever, no chills, no chest pain, denies n/v, diarrhea, brbpr, black stool.  Pt was brought to ED for evaluation of sob.   In ED,  CXR IMPRESSION: 1. The appearance the chest suggests moderate congestive heart failure, as above. 2. Aortic atherosclerosis.  Na 139, K 4.4, Bun 44, Creatinine 1.72,  Ast 61, Alt 54, alkphos 64, T. Bili 0.9  Wbc 8.1, Hgb 11.7, Plt 255  Trop 0.09  BNP 2,093.0  Lipase 34  Digoxin 1.3  EKG aifb at 97, nl axis, st depression v5,6  Urinalysis Wbc 6-30 Pt treated with lasix 40mg  iv x1 in ED.   ED apparently spoke with Dr. Mayford Knifeurner earlier who reviewed EKG and thought that patient could remain at [facility] and be seen by cardiology in [place]  Pt will be admitted for dyspnea secondary to CHF, and also hti.       Review of systems:    In addition to the HPI above, No Fever-chills, No Headache, No changes with Vision or hearing, No problems swallowing food or Liquids, No Chest pain,  No Nausea or Vommitting, Bowel movements are regular, No Blood in stool or Urine, No dysuria, No new skin  rashes or bruises, No new joints pains-aches,  No new weakness, tingling, numbness in any extremity, No recent weight gain or loss, No polyuria, polydypsia or polyphagia, No significant Mental Stressors.  A full 10 point Review of Systems was done, except as stated above, all other Review of Systems were negative.   With Past History of the following :    Past Medical History:  Diagnosis Date  . Aortic stenosis    a. moderate AS by echo in 04/2015  . Atrial fibrillation (HCC)    on Eliquis  . Elevated PSA   . Emphysema lung (HCC)   . Hypertension    no longer on medication  . Hypothyroid   . Insomnia   . Kidney stone   . Polymyalgia rheumatica (HCC)    family is not aware of this diagnosis  .  Pressure ulcer   . Reflux       Past Surgical History:  Procedure Laterality Date  . APPENDECTOMY    . CHOLECYSTECTOMY    . THYROIDECTOMY    . TONSILLECTOMY        Social History:     Social History   Tobacco Use  . Smoking status: Never Smoker  . Smokeless tobacco: Never Used  Substance Use Topics  . Alcohol use: No    Alcohol/week: 0.0 oz     Lives - at home  Mobility - walks by self   Family History :     Family History  Problem Relation Age of Onset  . Heart attack Father       Home Medications:   Prior to Admission medications   Medication Sig Start Date End Date Taking? Authorizing Provider  Acetaminophen (TYLENOL) 325 MG CAPS Take 1 capsule by mouth daily as needed.   Yes [provider]  DIGOX 125 MCG tablet take 1 tablet by mouth every other day 12/11/16  Yes [name], MD  ELIQUIS 2.5 MG TABS tablet take 1 tablet by mouth twice a day 11/05/16  Yes Branch, Dorothe Pea, MD  feeding supplement, ENSURE COMPLETE, (ENSURE COMPLETE) LIQD Take 237 mLs by mouth 2 (two) times daily between meals. 02/03/13  Yes Leroy Sea, MD  furosemide (LASIX) 20 MG tablet Take 2 tablets (40 mg total) by mouth daily. 03/20/17  Yes BranchDorothe Pea,  MD  levothyroxine (SYNTHROID, LEVOTHROID) 75 MCG tablet Take 1 tablet (75 mcg total) by mouth daily before breakfast. 05/17/16  Yes Nida, Denman George, MD  loratadine (CLARITIN) 10 MG tablet Take 10 mg by mouth daily.   Yes [provider]  Melatonin 1 MG TABS Take 1 mg by mouth at bedtime as needed (sleep).   Yes [provider]  pantoprazole (PROTONIX) 40 MG tablet Take 1 tablet (40 mg total) by mouth daily. 01/14/17  Yes Setzer, Terri L, NP  potassium chloride SA (K-DUR,KLOR-CON) 20 MEQ tablet Take 20 mEq by mouth daily.   Yes [provider]  psyllium (HYDROCIL/METAMUCIL) 95 % PACK Take 1 packet by mouth daily as needed for mild constipation.   Yes [provider]  triamcinolone ointment (KENALOG) 0.1 % apply to affected area twice a day 12/10/16  Yes [name], MD     Allergies:     Allergies  Allergen Reactions  . Cardura [Doxazosin Mesylate]      Physical Exam:   Vitals  Blood pressure 119/90, pulse 99, temperature 98.1 F (36.7 C), temperature source Oral, resp. rate (!) 22, height 5\' 1"  (1.549 m), weight 49.4 kg (109 lb), SpO2 94 %.   1. General lying in bed in NAD,    2. Normal affect and insight, Not Suicidal or Homicidal, Awake Alert, Oriented X 3.  3. No F.N deficits, ALL C.Nerves Intact, Strength 5/5 all 4 extremities, Sensation intact all 4 extremities, Plantars down going.  4. Ears and Eyes appear Normal, Conjunctivae clear, PERRLA. Moist Oral Mucosa.  5. Supple Neck, + JVD, No cervical lymphadenopathy appriciated, No Carotid Bruits.  6. Symmetrical Chest wall movement, + bilateral basilar crackles no wheezing   7. Irr, irr s1, s2, 2/6 sem rusb, apex.  8. Positive Bowel Sounds, Abdomen Soft, No tenderness, No organomegaly appriciated,No rebound -guarding or rigidity.  9.  No Cyanosis, Normal Skin Turgor, No Skin Rash or Bruise.  10. Good muscle tone,  joints appear normal , no effusions, Normal  ROM.  11. No  Palpable Lymph Nodes in Neck or Axillae     Data Review:    CBC Recent Labs  Lab 03/31/17 1558  WBC 8.1  HGB 11.7*  HCT 35.9*  PLT 255  MCV 86.7  MCH 28.3  MCHC 32.6  RDW 18.1*  LYMPHSABS 1.0  MONOABS 0.9  EOSABS 0.0  BASOSABS 0.0   ------------------------------------------------------------------------------------------------------------------  Chemistries  Recent Labs  Lab 03/25/17 1137 03/31/17 1558  NA 141 139  K 4.6 4.4  CL 106 102  CO2 25 24  GLUCOSE 75 110*  BUN 40* 44*  CREATININE 1.69* 1.72*  CALCIUM 9.2 9.1  AST  --  61*  ALT  --  54  ALKPHOS  --  64  BILITOT  --  0.9   ------------------------------------------------------------------------------------------------------------------ estimated creatinine clearance is 18.7 mL/min (A) (by C-G formula based on SCr of 1.72 mg/dL (H)). ------------------------------------------------------------------------------------------------------------------ No results for input(s): TSH, T4TOTAL, T3FREE, THYROIDAB in the last 72 hours.  Invalid input(s): FREET3  Coagulation profile No results for input(s): INR, PROTIME in the last 168 hours. ------------------------------------------------------------------------------------------------------------------- No results for input(s): DDIMER in the last 72 hours. -------------------------------------------------------------------------------------------------------------------  Cardiac Enzymes Recent Labs  Lab 03/31/17 1558  TROPONINI 0.09*   ------------------------------------------------------------------------------------------------------------------    Component Value Date/Time   BNP 2,093.0 (H) 03/31/2017 1558     ---------------------------------------------------------------------------------------------------------------  Urinalysis    Component Value Date/Time   COLORURINE YELLOW 12/31/2016 1502   APPEARANCEUR CLOUDY (A) 12/31/2016 1502    LABSPEC 1.006 12/31/2016 1502   PHURINE 6.0 12/31/2016 1502   GLUCOSEU NEGATIVE 12/31/2016 1502   HGBUR SMALL (A) 12/31/2016 1502   BILIRUBINUR NEGATIVE 12/31/2016 1502   KETONESUR NEGATIVE 12/31/2016 1502   PROTEINUR NEGATIVE 12/31/2016 1502   UROBILINOGEN 0.2 02/09/2013 0336   NITRITE POSITIVE (A) 12/31/2016 1502   LEUKOCYTESUR LARGE (A) 12/31/2016 1502    ----------------------------------------------------------------------------------------------------------------   Imaging Results:    Dg Chest Portable 1 View  Result Date: 03/31/2017 CLINICAL DATA:  84 year old male with history of shortness of breath, fever and headache. Runny nose since yesterday. EXAM: PORTABLE CHEST 1 VIEW COMPARISON:  Chest x-ray 03/20/2017. FINDINGS: There is cephalization of the pulmonary vasculature, indistinctness of the interstitial markings, and patchy airspace disease throughout the lungs bilaterally suggestive of moderate pulmonary edema. Small to moderate bilateral pleural effusions. Mild cardiomegaly. The patient is rotated to the left on today's exam, resulting in distortion of the mediastinal contours and reduced diagnostic sensitivity and specificity for mediastinal pathology. Atherosclerosis in the thoracic aorta. IMPRESSION: 1. The appearance the chest suggests moderate congestive heart failure, as above. 2. Aortic atherosclerosis. Electronically Signed   By: Trudie Reed M.D.   On: 03/31/2017 16:13       Assessment & Plan:    Principal Problem:   CHF (congestive heart failure) (HCC) Active Problems:   Atrial fibrillation, chronic (HCC)   Elevated troponin   Renal insufficiency    CHF (Acute, systolic) Tele Trop I q6h x3 Check cardiac echo Lasix 40mg  iv qday Cont potassium Cardiology consult  Troponin elevation Trop I q6h x3 Cardiac echo Aspirin 324mg  po x1 Lipitor 80mg  po x1 Check lipid  Pafib Cont digoxin Cont Eliquis pharmacy to dose  Abnormal liver function Check  acute hepatitis panel Check RUQ ultrasound  Hypothyroidism Cont levothyroxine Check Tsh  UTI Urine culture Rocephin 1gm iv qday   DVT Prophylaxis Eliquis  - SCDs  AM Labs Ordered, also please review Full Orders  Family Communication: Admission, patients condition and plan of care including  tests being ordered have been discussed with the patient  and daughter who indicate understanding and agree with the plan and Code Status.  Code Status  DNR  Likely DC to  home  Condition GUARDED    Consults called: cardiology placed in computer  Admission status: inpatient  Time spent in minutes : 45   Pearson Grippe M.D on 03/31/2017 at 6:36 PM  Between 7am to 7pm - Pager - [phone]   After 7pm go to [email or web address] - password [facility]  [facility] - Office  [phone]

## 2017-03-31 NOTE — ED Provider Notes (Signed)
[facility]ENCY [facility]PARTMENT Provider Note   CSN: [id number] Arrival date & time: 03/31/17  1527     History   Chief Complaint Chief Complaint  Patient presents with  . Shortness of Breath    HPI Jeffrey Mccarty is a 84 y.o. male.  HPI Patient with history of dementia.  Unable to contribute to history.  Level 5 caveat.  Daughter is at bedside.  States patient has been acting "sick" for the last 2 days.  Thinks he has had some sneezing and mild cough and questionable increased work of breathing.  No known fevers.  No known vomiting or diarrhea.  Patient has indwelling Foley catheter which daughter thinks is bothering the patient. Past Medical History:  Diagnosis Date  . Aortic stenosis    a. moderate AS by echo in 04/2015  . Atrial fibrillation (HCC)    on Eliquis  . Elevated PSA   . Emphysema lung (HCC)   . Hypertension    no longer on medication  . Hypothyroid   . Insomnia   . Kidney stone   . Polymyalgia rheumatica (HCC)    family is not aware of this diagnosis  . Pressure ulcer   . Reflux     Patient Active Problem List   Diagnosis Date Noted  . Elevated troponin 03/31/2017  . Renal insufficiency 03/31/2017  . UTI (urinary tract infection) 12/31/2016  . Atrial fibrillation, chronic (HCC) 12/31/2016  . Chronic anticoagulation 12/31/2016  . CHF (congestive heart failure) (HCC) 12/31/2016  . Dementia 12/31/2016  . AKI (acute kidney injury) (HCC) 09/24/2016  . Colitis 09/24/2016  . Gout 09/11/2016  . Hypothyroidism following radioiodine therapy 11/12/2014  . Allergic rhinitis 05/24/2013  . Malnutrition of moderate degree (HCC) 02/03/2013  . Esophageal reflux 02/01/2013  . Rash and nonspecific skin eruption 02/01/2013  . Atrial fibrillation with RVR (HCC) 01/31/2013  . URTI (acute upper respiratory infection) 01/31/2013  . ARF (acute renal failure) (HCC) 01/31/2013  . Essential hypertension 10/05/2008  . BILIARY DYSKINESIA 10/05/2008    Past Surgical  History:  Procedure Laterality Date  . APPENDECTOMY    . CHOLECYSTECTOMY    . THYROIDECTOMY    . TONSILLECTOMY         Home Medications    Prior to Admission medications   Medication Sig Start Date End Date Taking? Authorizing Provider  Acetaminophen (TYLENOL) 325 MG CAPS Take 1 capsule by mouth daily as needed.   Yes [provider]  DIGOX 125 MCG tablet take 1 tablet by mouth every other day 12/11/16  Yes Merlyn AlbertLuking, [name], MD  ELIQUIS 2.5 MG TABS tablet take 1 tablet by mouth twice a day 11/05/16  Yes Branch, Dorothe PeaJonathan F, MD  feeding supplement, ENSURE COMPLETE, (ENSURE COMPLETE) LIQD Take 237 mLs by mouth 2 (two) times daily between meals. 02/03/13  Yes Leroy SeaSingh, Prashant K, MD  furosemide (LASIX) 20 MG tablet Take 2 tablets (40 mg total) by mouth daily. 03/20/17  Yes BranchDorothe Pea, Jonathan F, MD  levothyroxine (SYNTHROID, LEVOTHROID) 75 MCG tablet Take 1 tablet (75 mcg total) by mouth daily before breakfast. 05/17/16  Yes Nida, Denman GeorgeGebreselassie W, MD  loratadine (CLARITIN) 10 MG tablet Take 10 mg by mouth daily.   Yes [provider]  Melatonin 1 MG TABS Take 1 mg by mouth at bedtime as needed (sleep).   Yes [provider]  pantoprazole (PROTONIX) 40 MG tablet Take 1 tablet (40 mg total) by mouth daily. 01/14/17  Yes Setzer, Brand Maleserri L, NP  potassium  chloride SA (K-DUR,KLOR-CON) 20 MEQ tablet Take 20 mEq by mouth daily.   Yes [provider]  psyllium (HYDROCIL/METAMUCIL) 95 % PACK Take 1 packet by mouth daily as needed for mild constipation.   Yes [provider]  triamcinolone ointment (KENALOG) 0.1 % apply to affected area twice a day 12/10/16  Yes [name], MD    Family History Family History  Problem Relation Age of Onset  . Heart attack Father     Social History Social History   Tobacco Use  . Smoking status: Never Smoker  . Smokeless tobacco: Never Used  Substance Use Topics  . Alcohol use: No    Alcohol/week: 0.0 oz  . Drug  use: No     Allergies   Cardura [doxazosin mesylate]   Review of Systems Review of Systems  Unable to perform ROS: Dementia     Physical Exam Updated Vital Signs BP 119/90   Pulse 99   Temp 98.1 F (36.7 C) (Oral)   Resp (!) 22   Ht 5\' 1"  (1.549 m)   Wt 49.4 kg (109 lb)   SpO2 94%   BMI 20.60 kg/m   Physical Exam  Constitutional: He appears well-developed and well-nourished.  Frail-appearing  HENT:  Head: Normocephalic and atraumatic.  Mouth/Throat: Oropharynx is clear and moist.  Eyes: EOM are normal. Pupils are equal, round, and reactive to light.  Neck: Normal range of motion. Neck supple. JVD present.  Patient with distended and ectatic right external jugular  Cardiovascular: Normal rate.  Murmur heard. 4/5 holosystolic murmur.  Irregularly irregular  Pulmonary/Chest: Breath sounds normal.  Increased work of breathing.  Lungs otherwise clear  Abdominal: Soft. Bowel sounds are normal. There is tenderness. There is no rebound and no guarding.  Patient appears to have some mild upper abdominal tenderness to palpation.  No rebound or guarding.  Musculoskeletal: Normal range of motion. He exhibits no edema or tenderness.  No lower extremity swelling, asymmetry or tenderness.  Distal pulses are 2+.  Neurological: He is alert.  Follow simple commands.  Very hard of hearing.  Appears to move all extremities without deficit.  Skin: Skin is warm and dry. Capillary refill takes less than 2 seconds. No rash noted. No erythema.  Nursing note and vitals reviewed.    ED Treatments / Results  Labs (all labs ordered are listed, but only abnormal results are displayed) Labs Reviewed  COMPREHENSIVE METABOLIC PANEL - Abnormal; Notable for the following components:      Result Value   Glucose, Bld 110 (*)    BUN 44 (*)    Creatinine, Ser 1.72 (*)    AST 61 (*)    GFR calc non Af Amer 33 (*)    GFR calc Af Amer 38 (*)    All other components within normal limits  CBC  WITH DIFFERENTIAL/PLATELET - Abnormal; Notable for the following components:   RBC 4.14 (*)    Hemoglobin 11.7 (*)    HCT 35.9 (*)    RDW 18.1 (*)    All other components within normal limits  TROPONIN I - Abnormal; Notable for the following components:   Troponin I 0.09 (*)    All other components within normal limits  BRAIN NATRIURETIC PEPTIDE - Abnormal; Notable for the following components:   B Natriuretic Peptide 2,093.0 (*)    All other components within normal limits  LIPASE, BLOOD  DIGOXIN LEVEL  URINALYSIS, ROUTINE W REFLEX MICROSCOPIC    EKG  EKG Interpretation  Date/Time:  Sunday March 31 2017 15:42:27 EST Ventricular Rate:  97 PR Interval:    QRS Duration: 98 QT Interval:  371 QTC Calculation: 472 R Axis:   -6 Text Interpretation:  Atrial fibrillation LVH with secondary repolarization abnormality Confirmed by Loren Racer ([id number]) on 03/31/2017 5:10:35 PM       Radiology Dg Chest Portable 1 View  Result Date: 03/31/2017 CLINICAL DATA:  84 year old male with history of shortness of breath, fever and headache. Runny nose since yesterday. EXAM: PORTABLE CHEST 1 VIEW COMPARISON:  Chest x-ray 03/20/2017. FINDINGS: There is cephalization of the pulmonary vasculature, indistinctness of the interstitial markings, and patchy airspace disease throughout the lungs bilaterally suggestive of moderate pulmonary edema. Small to moderate bilateral pleural effusions. Mild cardiomegaly. The patient is rotated to the left on today's exam, resulting in distortion of the mediastinal contours and reduced diagnostic sensitivity and specificity for mediastinal pathology. Atherosclerosis in the thoracic aorta. IMPRESSION: 1. The appearance the chest suggests moderate congestive heart failure, as above. 2. Aortic atherosclerosis. Electronically Signed   By: Trudie Reed M.D.   On: 03/31/2017 16:13    Procedures Procedures (including critical care time)  Medications Ordered in  ED Medications  furosemide (LASIX) injection 40 mg (40 mg Intravenous Given 03/31/17 1738)     Initial Impression / Assessment and Plan / ED Course  I have reviewed the triage vital signs and the nursing notes.  Pertinent labs & imaging results that were available during my care of the patient were reviewed by me and considered in my medical decision making (see chart for details).    X-ray with evidence of pulmonary edema.  BNP is significantly elevated.  Mild bump in troponin.  EKG with ST segment depression in lateral leads.  Discussed with Dr. Mayford Knife who reviewed patient's previous EKG.  States that he has had similar changes in the past.  Was advised keeping patient in [place] and admitting to hospitalist for trending of troponin and diuresis.  Consult cardiology in the morning as needed.  Patient is more comfortable sitting up.  Given dose of IV Lasix.  Discussed with Dr. Selena Batten.  Will see patient in the emergency department and admit.   Final Clinical Impressions(s) / ED Diagnoses   Final diagnoses:  Acute on chronic systolic congestive heart failure [facility])    ED Discharge Orders    None       Loren Racer, MD 03/31/17 [phone]

## 2017-04-01 ENCOUNTER — Inpatient Hospital Stay (HOSPITAL_COMMUNITY): Payer: Medicare HMO

## 2017-04-01 ENCOUNTER — Encounter (HOSPITAL_COMMUNITY): Payer: Self-pay | Admitting: Emergency Medicine

## 2017-04-01 DIAGNOSIS — I959 Hypotension, unspecified: Secondary | ICD-10-CM

## 2017-04-01 DIAGNOSIS — N183 Chronic kidney disease, stage 3 (moderate): Secondary | ICD-10-CM

## 2017-04-01 DIAGNOSIS — N390 Urinary tract infection, site not specified: Secondary | ICD-10-CM

## 2017-04-01 DIAGNOSIS — L8990 Pressure ulcer of unspecified site, unspecified stage: Secondary | ICD-10-CM

## 2017-04-01 LAB — COMPREHENSIVE METABOLIC PANEL
ALT: 46 U/L (ref 17–63)
ANION GAP: 12 (ref 5–15)
AST: 48 U/L — ABNORMAL HIGH (ref 15–41)
Albumin: 3.2 g/dL — ABNORMAL LOW (ref 3.5–5.0)
Alkaline Phosphatase: 53 U/L (ref 38–126)
BILIRUBIN TOTAL: 0.7 mg/dL (ref 0.3–1.2)
BUN: 40 mg/dL — ABNORMAL HIGH (ref 6–20)
CALCIUM: 8.5 mg/dL — AB (ref 8.9–10.3)
CO2: 27 mmol/L (ref 22–32)
Chloride: 101 mmol/L (ref 101–111)
Creatinine, Ser: 1.59 mg/dL — ABNORMAL HIGH (ref 0.61–1.24)
GFR calc non Af Amer: 36 mL/min — ABNORMAL LOW (ref >60)
GFR, EST AFRICAN AMERICAN: 41 mL/min — AB (ref >60)
GLUCOSE: 82 mg/dL (ref 65–99)
Potassium: 3.3 mmol/L — ABNORMAL LOW (ref 3.5–5.1)
Sodium: 140 mmol/L (ref 135–145)
TOTAL PROTEIN: 6.8 g/dL (ref 6.5–8.1)

## 2017-04-01 LAB — LIPID PANEL
CHOL/HDL RATIO: 2.2 ratio
CHOLESTEROL: 123 mg/dL (ref 0–200)
HDL: 55 mg/dL (ref >40)
LDL Cholesterol: 60 mg/dL (ref 0–99)
TRIGLYCERIDES: 39 mg/dL (ref <150)
VLDL: 8 mg/dL (ref 0–40)

## 2017-04-01 LAB — TROPONIN I
Troponin I: 0.1 ng/mL (ref <0.03)
Troponin I: 0.09 ng/mL (ref <0.03)

## 2017-04-01 LAB — CBC
HCT: 33.9 % — ABNORMAL LOW (ref 39.0–52.0)
Hemoglobin: 11 g/dL — ABNORMAL LOW (ref 13.0–17.0)
MCH: 28.4 pg (ref 26.0–34.0)
MCHC: 32.4 g/dL (ref 30.0–36.0)
MCV: 87.4 fL (ref 78.0–100.0)
PLATELETS: 235 10*3/uL (ref 150–400)
RBC: 3.88 MIL/uL — ABNORMAL LOW (ref 4.22–5.81)
RDW: 17.9 % — AB (ref 11.5–15.5)
WBC: 7 10*3/uL (ref 4.0–10.5)

## 2017-04-01 MED ORDER — ORAL CARE MOUTH RINSE
15.0000 mL | Freq: Two times a day (BID) | OROMUCOSAL | Status: DC
  Administered 2017-04-01 – 2017-04-04 (×7): 15 mL via OROMUCOSAL

## 2017-04-01 NOTE — Progress Notes (Addendum)
PROGRESS NOTE                                                                                                                                                                                                             Patient Demographics:    Jeffrey Mccarty, is a 84 y.o. male, DOB - Mar 16, 1921, ZOX:[id number]  Admit date - 03/31/2017   Admitting Physician Pearson Grippe, MD  Outpatient Primary MD for the patient is Gerda Diss Vilinda Blanks, MD  LOS - 1  Outpatient Specialists: Dr Wyline Mood  Chief Complaint  Patient presents with  . Shortness of Breath       Brief Narrative   84 year old male with history of A. fib on anticoagulation, chronic kidney disease stage III, chronic combined systolic and diastolic CHF (EF of 40-45%), mild to moderate left ear and moderate MR, chronic indwelling Foley, who presented from home with increasing lower leg edema for 2 weeks duration.  His Lasix dose was increased from 20 mg daily to 40 mg daily by his cardiology.  Patient reported some epigastric discomfort but no fevers, chills, chest pain, abdominal pain, nausea, vomiting, bowel or urinary symptoms. In the ED vitals were stable with blood work showing BUN of 44, creatinine 1.72, mild transaminitis, hemoglobin of 11.7, troponin of 0.09, BNP of 2000, UA positive for UTI and EKG showing A. fib with ST depression in lateral leads.  Chest x-ray showed finding of CHF. Patient admitted to stepdown unit for further management.    Subjective:   Patient is extremely hard of hearing and having difficulty to communicate.  Reports his dyspnea to be better this morning.  Assessment  & Plan :    Principal Problem: Acute on chronic combined systolic and diastolic CHF (HCC) Received 2 dose of IV Lasix 40 mg on admission with 2.7 L urine output overnight.  cardiology consult appreciated and recommend to continue Lasix 40 mg IV daily.  Blood pressure is soft so unable  to optimize medical management.  Reportedly has not tolerated beta-blocker in the past. Given his chronic kidney disease and advanced age is not a candidate for invasive procedure. Monitor strict I/O and daily weight.  Active Problems:   Atrial fibrillation, chronic (HCC) Rate controlled.  Has not controlled rate limiting agents in the past due to low blood pressure.  On digoxin every other day.  (  Level therapeutic).  On Eliquis.   elevated troponin.   Suspect demand ischemia.  Continue aspirin and statin.  The echo only done few days ago so does not need to be repeated.  Hypotension Chronic.  Blood pressure currently stable.  Monitor while on IV Lasix.  Hypokalemia Replenished.  Chronic kidney disease stage III Renal function at baseline.  UTI On empiric Rocephin.  Follow cultures.   Transaminitis Mild.  Ultrasound without acute findings.  Dysphagia N.p.o. until swallow evaluation.  Hypothyroidism Continue Synthroid.    Pressure injury of skin Care per nursing.   urinary retention with chr indwelling foley Due to foley change tomorrow  Code Status : DNR  Family Communication  : None at bedside  Disposition Plan  : Home once improved  Barriers For Discharge : Active symptoms  Consults  :  Cardiology  Procedures  : None  DVT Prophylaxis  : Eliquis  Lab Results  Component Value Date   PLT 235 04/01/2017    Antibiotics  :    Anti-infectives (From admission, onward)   Start     Dose/Rate Route Frequency Ordered Stop   03/31/17 2000  cefTRIAXone (ROCEPHIN) 1 g in sodium chloride 0.9 % 100 mL IVPB     1 g 200 mL/hr over 30 Minutes Intravenous Every 24 hours 03/31/17 1941          Objective:   Vitals:   04/01/17 0530 04/01/17 0600 04/01/17 0749 04/01/17 1104  BP:  99/64    Pulse: 64 70    Resp: 17 (!) 24    Temp:   98 F (36.7 C) 98.3 F (36.8 C)  TempSrc:   Oral Oral  SpO2: 99% 96%    Weight:      Height:        Wt Readings from Last 3  Encounters:  04/01/17 46.4 kg (102 lb 4.7 oz)  03/25/17 49.5 kg (109 lb 3.2 oz)  03/20/17 51.4 kg (113 lb 6.4 oz)     Intake/Output Summary (Last 24 hours) at 04/01/2017 1442 Last data filed at 04/01/2017 0515 Gross per 24 hour  Intake 178.17 ml  Output 2740 ml  Net -2561.83 ml     Physical Exam  Gen: Elderly male lying in bed, appears fatigued, not in distress, very hard of hearing HEENT: JVD +, no pallor, moist mucosa, supple neck Chest: Bibasilar crackles, no rhonchi or wheeze CVS: S1 and S2 regular, systolic murmur 3/6 GI: soft, NT, ND, BS+, chronic indwelling Foley Musculoskeletal: warm, no edema     Data Review:    CBC Recent Labs  Lab 03/31/17 1558 04/01/17 0248  WBC 8.1 7.0  HGB 11.7* 11.0*  HCT 35.9* 33.9*  PLT 255 235  MCV 86.7 87.4  MCH 28.3 28.4  MCHC 32.6 32.4  RDW 18.1* 17.9*  LYMPHSABS 1.0  --   MONOABS 0.9  --   EOSABS 0.0  --   BASOSABS 0.0  --     Chemistries  Recent Labs  Lab 03/31/17 1558 04/01/17 0247  NA 139 140  K 4.4 3.3*  CL 102 101  CO2 24 27  GLUCOSE 110* 82  BUN 44* 40*  CREATININE 1.72* 1.59*  CALCIUM 9.1 8.5*  AST 61* 48*  ALT 54 46  ALKPHOS 64 53  BILITOT 0.9 0.7   ------------------------------------------------------------------------------------------------------------------ Recent Labs    04/01/17 0247  CHOL 123  HDL 55  LDLCALC 60  TRIG 39  CHOLHDL 2.2    No results found for: HGBA1C ------------------------------------------------------------------------------------------------------------------  Recent Labs    03/31/17 2101  TSH 1.733   ------------------------------------------------------------------------------------------------------------------ No results for input(s): VITAMINB12, FOLATE, FERRITIN, TIBC, IRON, RETICCTPCT in the last 72 hours.  Coagulation profile No results for input(s): INR, PROTIME in the last 168 hours.  No results for input(s): DDIMER in the last 72 hours.  Cardiac  Enzymes Recent Labs  Lab 03/31/17 2101 04/01/17 0247 04/01/17 0845  TROPONINI 0.10* 0.10* 0.09*   ------------------------------------------------------------------------------------------------------------------    Component Value Date/Time   BNP 2,093.0 (H) 03/31/2017 1558    Inpatient Medications  Scheduled Meds: . apixaban  2.5 mg Oral BID  . atorvastatin  80 mg Oral q1800  . [START ON 04/02/2017] digoxin  125 mcg Oral QODAY  . feeding supplement (ENSURE ENLIVE)  237 mL Oral BID BM  . furosemide  40 mg Intravenous Daily  . levothyroxine  75 mcg Oral QAC breakfast  . loratadine  10 mg Oral Daily  . mouth rinse  15 mL Mouth Rinse BID  . pantoprazole  40 mg Oral Daily  . potassium chloride SA  20 mEq Oral Daily  . sodium chloride flush  3 mL Intravenous Q12H   Continuous Infusions: . sodium chloride 250 mL (03/31/17 2126)  . cefTRIAXone (ROCEPHIN)  IV Stopped (03/31/17 2231)   PRN Meds:.sodium chloride, acetaminophen **OR** acetaminophen, methocarbamol, psyllium, sodium chloride flush  Micro Results Recent Results (from the past 240 hour(s))  MRSA PCR Screening     Status: None   Collection Time: 03/31/17  8:00 PM  Result Value Ref Range Status   MRSA by PCR NEGATIVE NEGATIVE Final    Comment:        The GeneXpert MRSA Assay (FDA approved for NASAL specimens only), is one component of a comprehensive MRSA colonization surveillance program. It is not intended to diagnose MRSA infection nor to guide or monitor treatment for MRSA infections. Performed at [facility], [address], [place], [place] [address]     Radiology Reports Dg Chest 2 View  Result Date: 03/20/2017 CLINICAL DATA:  84 year old male with shortness of breath and wheezing. EXAM: CHEST  2 VIEW COMPARISON:  12/31/2016 and earlier. FINDINGS: Chronic cardiomegaly appears stable since 2015. Other mediastinal contours are stable. Chronic pulmonary hyperinflation with increased AP dimension to the  chest. Chronic but increased diffuse bilateral pulmonary interstitial opacity. No pleural effusion or pleural fluid is visible. No pneumothorax or confluent pulmonary opacity. Visualized tracheal air column is within normal limits. Osteopenia. Chronic lower thoracic or upper lumbar compression fracture is stable since 2015. No acute osseous abnormality identified. Stable cholecystectomy clips. Negative visible bowel gas pattern. IMPRESSION: 1. Chronic but increased bilateral pulmonary interstitial opacity. Top differential considerations are viral/atypical respiratory infection (mildly favored in light of no pleural fluid), and acute pulmonary edema. 2. Chronic cardiomegaly and pulmonary hyperinflation. Electronically Signed   By: Odessa Fleming M.D.   On: 03/20/2017 14:58   Dg Chest Portable 1 View  Result Date: 03/31/2017 CLINICAL DATA:  84 year old male with history of shortness of breath, fever and headache. Runny nose since yesterday. EXAM: PORTABLE CHEST 1 VIEW COMPARISON:  Chest x-ray 03/20/2017. FINDINGS: There is cephalization of the pulmonary vasculature, indistinctness of the interstitial markings, and patchy airspace disease throughout the lungs bilaterally suggestive of moderate pulmonary edema. Small to moderate bilateral pleural effusions. Mild cardiomegaly. The patient is rotated to the left on today's exam, resulting in distortion of the mediastinal contours and reduced diagnostic sensitivity and specificity for mediastinal pathology. Atherosclerosis in the thoracic aorta. IMPRESSION: 1. The  appearance the chest suggests moderate congestive heart failure, as above. 2. Aortic atherosclerosis. Electronically Signed   By: Trudie Reed M.D.   On: 03/31/2017 16:13   US Abdomen Limited Ruq  Result Date: 04/01/2017 CLINICAL DATA:  Abnormal LFTs EXAM: ULTRASOUND ABDOMEN LIMITED RIGHT UPPER QUADRANT COMPARISON:  CT abdomen and pelvis 12/31/2016 FINDINGS: Gallbladder: Surgically absent Common bile duct:  Diameter: 3 mm diameter, normal Liver: Mildly heterogeneous echogenicity, generally isoechoic to RIGHT kidney. No focal hepatic mass or nodularity. Portal vein is patent on color Doppler imaging with normal direction of blood flow towards the liver. No RIGHT upper quadrant free fluid. IMPRESSION: Post cholecystectomy. No focal hepatobiliary sonographic abnormalities identified. Electronically Signed   By: Ulyses Southward M.D.   On: 04/01/2017 08:39    Time Spent in minutes  35   Aliya Sol M.D on 04/01/2017 at 2:42 PM  Between 7am to 7pm - Pager - [phone]  After 7pm go to [email or web address] - password [facility]  [facility] -  Office  [phone]

## 2017-04-01 NOTE — Consult Note (Signed)
Cardiology Consultation:   Patient ID: Jeffrey Mccarty; [id number]; 02/18/1921   Admit date: 03/31/2017 Date of Consult: 04/01/2017  Primary Care Provider: [name], MD Primary Cardiologist: Dina Rich, MD     Patient Profile:   Jeffrey Mccarty is a 84 y.o. male with a hx of chronic sysotlic/diastolc HF who is being seen today for the evaluation of SOB at the request of Dr Gonzella Lex.  History of Present Illness:   Jeffrey Mccarty 84 yo male history of afib, HTN, aortic stenosis, chronic systolic/diastolic HF, urinary retention admitted with SOB. Seen in clinic recently with fluid overload, intiailly improved with increased diuretics. Admitted with worsening SOB at home, with evidence of fluid overload on admission. Denies any chest pain, reports compliance with meds.     K 4.4 Cr 1.72 WBC 8.1 Hgb 11.7 Plt 255 BNP 2000 TSH 1.7  Trop 0.0p-->0.10-->0.10 03/2017 echo LVEF 40-45%, cannot eval diastolic function, moderate AS, mod MR, mod RV dysfunction CXR: moderate CHF EKG afib, LVH  Past Medical History:  Diagnosis Date  . Aortic stenosis    a. moderate AS by echo in 04/2015  . Atrial fibrillation (HCC)    on Eliquis  . Elevated PSA   . Emphysema lung (HCC)   . Hypertension    no longer on medication  . Hypothyroid   . Insomnia   . Kidney stone   . Mitral regurgitation 03/25/2017   moderate  . Polymyalgia rheumatica (HCC)    family is not aware of this diagnosis  . Pressure ulcer   . Reflux     Past Surgical History:  Procedure Laterality Date  . APPENDECTOMY    . CHOLECYSTECTOMY    . THYROIDECTOMY    . TONSILLECTOMY         Inpatient Medications: Scheduled Meds: . apixaban  2.5 mg Oral BID  . atorvastatin  80 mg Oral q1800  . [START ON 04/02/2017] digoxin  125 mcg Oral QODAY  . feeding supplement (ENSURE ENLIVE)  237 mL Oral BID BM  . furosemide  40 mg Intravenous Daily  . levothyroxine  75 mcg Oral QAC breakfast  . loratadine  10 mg Oral Daily  . mouth  rinse  15 mL Mouth Rinse BID  . pantoprazole  40 mg Oral Daily  . potassium chloride SA  20 mEq Oral Daily  . sodium chloride flush  3 mL Intravenous Q12H   Continuous Infusions: . sodium chloride 250 mL (03/31/17 2126)  . cefTRIAXone (ROCEPHIN)  IV Stopped (03/31/17 2231)   PRN Meds: sodium chloride, acetaminophen **OR** acetaminophen, methocarbamol, psyllium, sodium chloride flush  Allergies:    Allergies  Allergen Reactions  . Cardura [Doxazosin Mesylate]     Social History:   Social History   Socioeconomic History  . Marital status: Widowed    Spouse name: Not on file  . Number of children: Not on file  . Years of education: Not on file  . Highest education level: Not on file  Social Needs  . Financial resource strain: Not on file  . Food insecurity - worry: Not on file  . Food insecurity - inability: Not on file  . Transportation needs - medical: Not on file  . Transportation needs - non-medical: Not on file  Occupational History  . Not on file  Tobacco Use  . Smoking status: Never Smoker  . Smokeless tobacco: Never Used  Substance and Sexual Activity  . Alcohol use: No    Alcohol/week: 0.0 oz  . Drug  use: No  . Sexual activity: Not on file  Other Topics Concern  . Not on file  Social History Narrative  . Not on file    Family History:    Family History  Problem Relation Age of Onset  . Heart attack Father      ROS:  Please see the history of present illness.   All other ROS reviewed and negative.     Physical Exam/Data:   Vitals:   04/01/17 0515 04/01/17 0530 04/01/17 0600 04/01/17 0749  BP: 98/60  99/64   Pulse: 62 64 70   Resp: 18 17 (!) 24   Temp:    98 F (36.7 C)  TempSrc:    Oral  SpO2: 99% 99% 96%   Weight:      Height:        Intake/Output Summary (Last 24 hours) at 04/01/2017 0828 Last data filed at 04/01/2017 0515 Gross per 24 hour  Intake 178.17 ml  Output 2740 ml  Net -2561.83 ml   Filed Weights   03/31/17 1535 03/31/17  2037 04/01/17 0500  Weight: 109 lb (49.4 kg) 104 lb 0.9 oz (47.2 kg) 102 lb 4.7 oz (46.4 kg)   Body mass index is 19.33 kg/m.  General:  Well nourished, well developed, in no acute distress HEENT: normal Lymph: no adenopathy Neck: elevated Endocrine:  No thryomegaly Cardiac:  Irreg, 3/6 systolic murmur rusb Lungs:bilateral carotid bruits Abd: soft, nontender, no hepatomegaly  Ext: no edema Musculoskeletal:  No deformities, BUE and BLE strength normal and equal Skin: warm and dry  Neuro:  CNs 2-12 intact, no focal abnormalities noted Psych:  Normal affect    Laboratory Data:  Chemistry Recent Labs  Lab 03/25/17 1137 03/31/17 1558 04/01/17 0247  NA 141 139 140  K 4.6 4.4 3.3*  CL 106 102 101  CO2 25 24 27   GLUCOSE 75 110* 82  BUN 40* 44* 40*  CREATININE 1.69* 1.72* 1.59*  CALCIUM 9.2 9.1 8.5*  GFRNONAA 33* 33* 36*  GFRAA 38* 38* 41*  ANIONGAP 10 13 12     Recent Labs  Lab 03/31/17 1558 04/01/17 0247  PROT 7.4 6.8  ALBUMIN 3.7 3.2*  AST 61* 48*  ALT 54 46  ALKPHOS 64 53  BILITOT 0.9 0.7   Hematology Recent Labs  Lab 03/31/17 1558 04/01/17 0248  WBC 8.1 7.0  RBC 4.14* 3.88*  HGB 11.7* 11.0*  HCT 35.9* 33.9*  MCV 86.7 87.4  MCH 28.3 28.4  MCHC 32.6 32.4  RDW 18.1* 17.9*  PLT 255 235   Cardiac Enzymes Recent Labs  Lab 03/31/17 1558 03/31/17 2101 04/01/17 0247  TROPONINI 0.09* 0.10* 0.10*   No results for input(s): TROPIPOC in the last 168 hours.  BNP Recent Labs  Lab 03/25/17 1137 03/31/17 1558  BNP 1,144.0* 2,093.0*    DDimer No results for input(s): DDIMER in the last 168 hours.  Radiology/Studies:  Dg Chest Portable 1 View  Result Date: 03/31/2017 CLINICAL DATA:  84 year old male with history of shortness of breath, fever and headache. Runny nose since yesterday. EXAM: PORTABLE CHEST 1 VIEW COMPARISON:  Chest x-ray 03/20/2017. FINDINGS: There is cephalization of the pulmonary vasculature, indistinctness of the interstitial markings, and  patchy airspace disease throughout the lungs bilaterally suggestive of moderate pulmonary edema. Small to moderate bilateral pleural effusions. Mild cardiomegaly. The patient is rotated to the left on today's exam, resulting in distortion of the mediastinal contours and reduced diagnostic sensitivity and specificity for mediastinal pathology. Atherosclerosis in the  thoracic aorta. IMPRESSION: 1. The appearance the chest suggests moderate congestive heart failure, as above. 2. Aortic atherosclerosis. Electronically Signed   By: Trudie Reedaniel  Entrikin M.D.   On: 03/31/2017 16:13    Assessment and Plan:   1. Acute on chronic sysotlic/diastolic HF - presents with symptoms of volume overload, pulm edema on CXR, increased BNP - negative 2.5 liters overnight, he received lasix 40mg  IV x 2 yesterday. Downtrend in Cr with diuresis consistent with venous congestion and CHF. Continue IV lasix today - 03/2017 echo shows decrease in LVE 40-45% from 07/2015 echo - medical therapy limited due to soft bp's, has not tolerated beta blocker in the past.  - continue diuresis. With his poor renal function and advanced age poor cath candidate, in the past he had made clear he did not want invasive procedures as well   2. Afib - has not tolerated beta blockers of CCBs in the past due to low bp's - actually has been on just every other day digoxin and done well   3. Aortic stenosis - moderate by last echo, not significant to be etiology of his drop in LVEF or symptoms - patient and family have made clear they would not want intervention in the future.    For questions or updates, please contact [facility] Please consult [email or web address] for contact info under Cardiology/STEMI.   Joanie CoddingtonSigned, Crosley Stejskal, MD  04/01/2017 8:28 AM

## 2017-04-01 NOTE — Evaluation (Signed)
Clinical/Bedside Swallow Evaluation Patient Details  Name: Jeffrey Mccarty MRN: [id number] Date of Birth: [birth year]  Today's Date: 04/01/2017 Time: SLP Start Time (ACUTE ONLY): 1338 SLP Stop Time (ACUTE ONLY): 1412 SLP Time Calculation (min) (ACUTE ONLY): 34 min  Past Medical History:  Past Medical History:  Diagnosis Date  . Aortic stenosis    a. moderate AS by echo in 04/2015  . Atrial fibrillation (HCC)    on Eliquis  . Elevated PSA   . Emphysema lung (HCC)   . Hypertension    no longer on medication  . Hypothyroid   . Insomnia   . Kidney stone   . Mitral regurgitation 03/25/2017   moderate  . Polymyalgia rheumatica (HCC)    family is not aware of this diagnosis  . Pressure ulcer   . Reflux    Past Surgical History:  Past Surgical History:  Procedure Laterality Date  . APPENDECTOMY    . CHOLECYSTECTOMY    . THYROIDECTOMY    . TONSILLECTOMY     HPI:  84 year old male with history of A. fib on anticoagulation, chronic kidney disease stage III, chronic combined systolic and diastolic CHF (EF of 40-45%), mild to moderate left ear and moderate MR, chronic indwelling Foley, who presented from home with increasing lower leg edema for 2 weeks duration.  His Lasix dose was increased from 20 mg daily to 40 mg daily by his cardiology.  Patient reported some epigastric discomfort but no fevers, chills, chest pain, abdominal pain, nausea, vomiting, bowel or urinary symptoms.   Assessment / Plan / Recommendation Clinical Impression  Oral motor evaluation is WFL. Pt with weak cough. Pt's oxygen saturation levels dip during po trials and when talking (RN reports this is consistent with what she noted today with activity). Pt initially without signs or symptoms of overt aspiration, however Pt with gurgling, throat clearing, and shortness of breath after straw sips of thin liquids. Pt tolerated puree and nectars without incident. Will initiate D1/puree and NTL and check Pt tomorrow and plan  for MBSS. Above to Pt, daughter, and Charity fundraiserN. SLP to follow.   SLP Visit Diagnosis: Dysphagia, unspecified (R13.10)    Aspiration Risk  Moderate aspiration risk;Risk for inadequate nutrition/hydration    Diet Recommendation Dysphagia 1 (Puree);Nectar-thick liquid   Liquid Administration via: Cup;Straw Medication Administration: Crushed with puree Supervision: Patient able to self feed;Full supervision/cueing for compensatory strategies Compensations: Slow rate;Small sips/bites Postural Changes: Seated upright at 90 degrees;Remain upright for at least 30 minutes after po intake    Other  Recommendations Oral Care Recommendations: Oral care BID;Staff/trained caregiver to provide oral care Other Recommendations: Order thickener from pharmacy;Clarify dietary restrictions   Follow up Recommendations Skilled Nursing facility      Frequency and Duration min 2x/week  1 week       Prognosis Prognosis for Safe Diet Advancement: Fair Barriers to Reach Goals: Severity of deficits      Swallow Study   General Date of Onset: 03/31/17 HPI: 84 year old male with history of A. fib on anticoagulation, chronic kidney disease stage III, chronic combined systolic and diastolic CHF (EF of 40-45%), mild to moderate left ear and moderate MR, chronic indwelling Foley, who presented from home with increasing lower leg edema for 2 weeks duration.  His Lasix dose was increased from 20 mg daily to 40 mg daily by his cardiology.  Patient reported some epigastric discomfort but no fevers, chills, chest pain, abdominal pain, nausea, vomiting, bowel or urinary symptoms. Type of Study: Bedside Swallow  Evaluation Previous Swallow Assessment: None on record Diet Prior to this Study: NPO Temperature Spikes Noted: No Respiratory Status: Nasal cannula History of Recent Intubation: No Behavior/Cognition: Alert;Cooperative;Pleasant mood(very HOH) Oral Cavity Assessment: Within Functional Limits Oral Care Completed by  SLP: Recent completion by staff Oral Cavity - Dentition: Dentures, top;Dentures, bottom Vision: Functional for self-feeding Self-Feeding Abilities: Able to feed self Patient Positioning: Upright in bed Baseline Vocal Quality: Normal Volitional Cough: Weak Volitional Swallow: Able to elicit    Oral/Motor/Sensory Function Overall Oral Motor/Sensory Function: Within functional limits   Ice Chips Ice chips: Within functional limits Presentation: Spoon   Thin Liquid Thin Liquid: Impaired Presentation: Cup;Self Fed;Spoon;Straw Pharyngeal  Phase Impairments: Suspected delayed Swallow;Multiple swallows;Wet Vocal Quality;Throat Clearing - Delayed    Nectar Thick Nectar Thick Liquid: Within functional limits Presentation: Cup;Straw   Honey Thick Honey Thick Liquid: Within functional limits Presentation: Spoon   Puree Puree: Within functional limits Presentation: Spoon   Solid   Thank you,  Jeffrey Mccarty, CCC-SLP [phone]    Solid: Not tested Presentation: (NT due to dyspnea)        Jeffrey Mccarty 04/01/2017,6:03 PM

## 2017-04-02 ENCOUNTER — Inpatient Hospital Stay (HOSPITAL_COMMUNITY): Payer: Medicare HMO

## 2017-04-02 LAB — BASIC METABOLIC PANEL
Anion gap: 12 (ref 5–15)
BUN: 42 mg/dL — AB (ref 6–20)
CO2: 30 mmol/L (ref 22–32)
CREATININE: 1.67 mg/dL — AB (ref 0.61–1.24)
Calcium: 8.8 mg/dL — ABNORMAL LOW (ref 8.9–10.3)
Chloride: 97 mmol/L — ABNORMAL LOW (ref 101–111)
GFR calc Af Amer: 39 mL/min — ABNORMAL LOW (ref >60)
GFR, EST NON AFRICAN AMERICAN: 34 mL/min — AB (ref >60)
Glucose, Bld: 93 mg/dL (ref 65–99)
POTASSIUM: 4 mmol/L (ref 3.5–5.1)
Sodium: 139 mmol/L (ref 135–145)

## 2017-04-02 LAB — HEPATITIS PANEL, ACUTE
HEP B C IGM: NEGATIVE
HEP B S AG: NEGATIVE
Hep A IgM: NEGATIVE

## 2017-04-02 LAB — MAGNESIUM: Magnesium: 2 mg/dL (ref 1.7–2.4)

## 2017-04-02 MED ORDER — FUROSEMIDE 10 MG/ML IJ SOLN
40.0000 mg | Freq: Once | INTRAMUSCULAR | Status: AC
  Administered 2017-04-02: 40 mg via INTRAVENOUS
  Filled 2017-04-02: qty 4

## 2017-04-02 MED ORDER — FUROSEMIDE 10 MG/ML IJ SOLN: 40.0000 mg | Freq: Two times a day (BID) | INTRAMUSCULAR | Status: DC

## 2017-04-02 NOTE — Progress Notes (Signed)
Modified Barium Swallow Progress Note  Patient Details  Name: Jeffrey Mccarty MRN: [id number] Date of Birth: 1921/07/08  Today's Date: 04/02/2017  Modified Barium Swallow completed.  Full report located under Chart Review in the Imaging Section.  Brief recommendations include the following:  Clinical Impression  Pt presents with moderate oropharyngeal phase dysphagia characterized by weak lingual manipulation resulting in reduced bolus cohesiveness, anterior posterior transit, piecemeal deglutition, and lingual residuals; premature spillage with min delay in swallow initiation, reduced tongue base retraction, and epiglottic deflection resulting in vallecular residuals which then spill to pyriforms during repeat swallows (epiglottis never fully deflected during the study) and trace, variable penetration of nectars and thins during the swallow and likely trace amounts after the swallow from vallecular residuals. Penetration was in trace amounts and generally observed tracking down the underside of the epiglottis, which only reached the vocal folds x1 and resulted in trace, silent aspiration during sequential straw sips of nectar. Effortful swallows were attempted and found to be ineffective to mitigate/reduce pharyngeal residuals with purees/mech soft. A liquid wash and several dry swallows were most effective in reducing pharyngeal residue. A cued cough followed by a dry swallow were also found to be effective. The trace amount of aspirated material (either NTL or thin residuals) was not seen tracking down the anterior tracheal wall and it is suspected that Pt removed it. Pt with brief retention of barium tablet near the valleculae and he was noted to spontaneously throw his head back (which family says is baseline) when swallowing it, likely trying to bypass valleculae as Pt with long, curled epiglottis which never fully deflected.    Esophageal sweep was completed and initially showed barium retention in  distal esophagus, which appeared to increase in amount following significant eructation. Backflow of barium noted. Pt reports h/o reflux. Pt is at moderate risk for aspiration due to pharyngeal weakness and suspect some degree of esophageal phase dysphagia placing him at risk for post prandial aspiration. Because there was no great difference in nectars and thins, recommend D2/finely chopped and THIN liquids via small sips (cup or straw) with periodic cough and repeat swallows; alternate solids/liquids intermittently and adhere to reflux and general aspiration precautions. OK for po medications whole in puree. This study was reviewed with Pt and his daughter. Pt is very HOH, so will bring written strategies to room.   Swallow Evaluation Recommendations   Recommended Consults: Consider esophageal assessment   SLP Diet Recommendations: Dysphagia 2 (Fine chop) solids;Thin liquid   Liquid Administration via: Cup;Straw   Medication Administration: Whole meds with puree   Supervision: Patient able to self feed;Full supervision/cueing for compensatory strategies;Staff to assist with self feeding   Compensations: Slow rate;Small sips/bites;Multiple dry swallows after each bite/sip;Follow solids with liquid;Clear throat intermittently   Postural Changes: Remain semi-upright after after feeds/meals (Comment);Seated upright at 90 degrees   Oral Care Recommendations: Oral care BID;Staff/trained caregiver to provide oral care   Other Recommendations: Clarify dietary restrictions   Thank you,  Havery MorosDabney Ayush Boulet, CCC-SLP [phone]  Lajuanda Penick 04/02/2017,6:31 PM

## 2017-04-02 NOTE — Progress Notes (Addendum)
PROGRESS NOTE                                                                                                                                                                                                             Patient Demographics:    Jeffrey Mccarty, is a 84 y.o. male, DOB - 1921-03-22, WJX:[id number]  Admit date - 03/31/2017   Admitting Physician Pearson Grippe, MD  Outpatient Primary MD for the patient is Gerda Diss Vilinda Blanks, MD  LOS - 2  Outpatient Specialists: Dr Wyline Mood  Chief Complaint  Patient presents with  . Shortness of Breath       Brief Narrative   84 year old male with history of A. fib on anticoagulation, chronic kidney disease stage III, chronic combined systolic and diastolic CHF (EF of 40-45%), mild to moderate left ear and moderate MR, chronic indwelling Foley, who presented from home with increasing lower leg edema for 2 weeks duration.  His Lasix dose was increased from 20 mg daily to 40 mg daily by his cardiology.  Patient reported some epigastric discomfort but no fevers, chills, chest pain, abdominal pain, nausea, vomiting, bowel or urinary symptoms. In the ED vitals were stable with blood work showing BUN of 44, creatinine 1.72, mild transaminitis, hemoglobin of 11.7, troponin of 0.09, BNP of 2000, UA positive for UTI and EKG showing A. fib with ST depression in lateral leads.  Chest x-ray showed findings of CHF. Patient admitted to stepdown unit for further management.    Subjective:   Patient extremely hard of hearing.  Reports his breathing to be much better.  Diuresing quite well on IV Lasix.  Blood pressure better overnight.  Multiple PVCs on the monitor.  Assessment  & Plan :    Principal Problem: Acute on chronic combined systolic and diastolic CHF (HCC) On IV Lasix 40 mg daily with good diuresis.  (-4 L since admission).  Blood pressure is better today so cardiology ordered for 2 doses of IV  Lasix (40 mg) today and reassess in the morning..   Reportedly has not tolerated beta-blocker in the past. Given his chronic kidney disease and advanced age is not a candidate for invasive procedure. Monitor strict I/O and daily weight.  Active Problems:   Atrial fibrillation, chronic (HCC) Rate controlled.  Has not controlled rate limiting agents in the past due to  low blood pressure.  On digoxin every other day.  (Level therapeutic).  On Eliquis.   elevated troponin.   Suspect demand ischemia.  Continue aspirin and statin.  Echo done last week so does not need to be repeated.  Hypotension Chronic.  Blood pressure stable overnight.  Monitor while on IV Lasix.  PVCs Replenished low potassium.  Magnesium normal.   Chronic kidney disease stage III Renal function at baseline.  ?  UTI Was started on empiric Rocephin on admission.  UA shows moderate leukocyte and few WBC with no bacteria.  Has chronic Foley.  Strongly doubt if he truly had UTI.  Will discontinue antibiotics.   Transaminitis Mild.  Ultrasound without acute findings.  Dysphagia On dysphagia level 1 diet with plan on modified barium swallow today.  Hypothyroidism Continue Synthroid.    Pressure injury of skin Care per nursing.   urinary retention with chr indwelling foley Due to foley change today.   Code Status : DNR  Family Communication  : None at bedside  Disposition Plan  : Stable for transfer to telemetry.  Pending clinical improvement and PT evaluation.  Lives alone at home with family close by.  Possibly discharge in the next 2-3 days.(home with home health versus SNF)  Barriers For Discharge : Active symptoms  Consults  :  Cardiology  Procedures  : None  DVT Prophylaxis  : Eliquis  Lab Results  Component Value Date   PLT 235 04/01/2017    Antibiotics  :    Anti-infectives (From admission, onward)   Start     Dose/Rate Route Frequency Ordered Stop   03/31/17 2000  cefTRIAXone (ROCEPHIN)  1 g in sodium chloride 0.9 % 100 mL IVPB     1 g 200 mL/hr over 30 Minutes Intravenous Every 24 hours 03/31/17 1941          Objective:   Vitals:   04/02/17 0600 04/02/17 0700 04/02/17 0800 04/02/17 0900  BP: (!) 108/91  (!) 134/99 (!) 133/105  Pulse: 71 87 97 (!) 102  Resp: (!) 23 (!) 31 20 (!) 21  Temp:   97.8 F (36.6 C)   TempSrc:   Oral   SpO2: 100% (!) 87% 94% (!) 81%  Weight:      Height:        Wt Readings from Last 3 Encounters:  04/02/17 46.4 kg (102 lb 4.7 oz)  03/25/17 49.5 kg (109 lb 3.2 oz)  03/20/17 51.4 kg (113 lb 6.4 oz)     Intake/Output Summary (Last 24 hours) at 04/02/2017 1034 Last data filed at 04/02/2017 0800 Gross per 24 hour  Intake 300 ml  Output 1600 ml  Net -1300 ml     Physical Exam General: Elderly male, very hard of hearing, not in distress HEENT: JVD +, moist mucosa, supple neck Chest: Fine bibasilar crackles, no rhonchi or wheeze CVS: Normal S1 and S2, systolic murmur 3/6 GI: Soft, nondistended, nontender, chronic indwelling Foley Musculoskeletal: Warm, no edema       Data Review:    CBC Recent Labs  Lab 03/31/17 1558 04/01/17 0248  WBC 8.1 7.0  HGB 11.7* 11.0*  HCT 35.9* 33.9*  PLT 255 235  MCV 86.7 87.4  MCH 28.3 28.4  MCHC 32.6 32.4  RDW 18.1* 17.9*  LYMPHSABS 1.0  --   MONOABS 0.9  --   EOSABS 0.0  --   BASOSABS 0.0  --     Chemistries  Recent Labs  Lab 03/31/17 1558 04/01/17 0247  04/02/17 0406  NA 139 140 139  K 4.4 3.3* 4.0  CL 102 101 97*  CO2 24 27 30   GLUCOSE 110* 82 93  BUN 44* 40* 42*  CREATININE 1.72* 1.59* 1.67*  CALCIUM 9.1 8.5* 8.8*  MG  --   --  2.0  AST 61* 48*  --   ALT 54 46  --   ALKPHOS 64 53  --   BILITOT 0.9 0.7  --    ------------------------------------------------------------------------------------------------------------------ Recent Labs    04/01/17 0247  CHOL 123  HDL 55  LDLCALC 60  TRIG 39  CHOLHDL 2.2    No results found for:  HGBA1C ------------------------------------------------------------------------------------------------------------------ Recent Labs    03/31/17 2101  TSH 1.733   ------------------------------------------------------------------------------------------------------------------ No results for input(s): VITAMINB12, FOLATE, FERRITIN, TIBC, IRON, RETICCTPCT in the last 72 hours.  Coagulation profile No results for input(s): INR, PROTIME in the last 168 hours.  No results for input(s): DDIMER in the last 72 hours.  Cardiac Enzymes Recent Labs  Lab 03/31/17 2101 04/01/17 0247 04/01/17 0845  TROPONINI 0.10* 0.10* 0.09*   ------------------------------------------------------------------------------------------------------------------    Component Value Date/Time   BNP 2,093.0 (H) 03/31/2017 1558    Inpatient Medications  Scheduled Meds: . apixaban  2.5 mg Oral BID  . digoxin  125 mcg Oral QODAY  . feeding supplement (ENSURE ENLIVE)  237 mL Oral BID BM  . furosemide  40 mg Intravenous Daily  . furosemide  40 mg Intravenous Once  . levothyroxine  75 mcg Oral QAC breakfast  . loratadine  10 mg Oral Daily  . mouth rinse  15 mL Mouth Rinse BID  . pantoprazole  40 mg Oral Daily  . potassium chloride SA  20 mEq Oral Daily  . sodium chloride flush  3 mL Intravenous Q12H   Continuous Infusions: . sodium chloride 250 mL (03/31/17 2126)  . cefTRIAXone (ROCEPHIN)  IV Stopped (04/01/17 2130)   PRN Meds:.sodium chloride, acetaminophen **OR** acetaminophen, methocarbamol, psyllium, sodium chloride flush  Micro Results Recent Results (from the past 240 hour(s))  MRSA PCR Screening     Status: None   Collection Time: 03/31/17  8:00 PM  Result Value Ref Range Status   MRSA by PCR NEGATIVE NEGATIVE Final    Comment:        The GeneXpert MRSA Assay (FDA approved for NASAL specimens only), is one component of a comprehensive MRSA colonization surveillance program. It is  not intended to diagnose MRSA infection nor to guide or monitor treatment for MRSA infections. Performed at [facility], [address], [place], [place] [address]     Radiology Reports Dg Chest 2 View  Result Date: 03/20/2017 CLINICAL DATA:  84 year old male with shortness of breath and wheezing. EXAM: CHEST  2 VIEW COMPARISON:  12/31/2016 and earlier. FINDINGS: Chronic cardiomegaly appears stable since 2015. Other mediastinal contours are stable. Chronic pulmonary hyperinflation with increased AP dimension to the chest. Chronic but increased diffuse bilateral pulmonary interstitial opacity. No pleural effusion or pleural fluid is visible. No pneumothorax or confluent pulmonary opacity. Visualized tracheal air column is within normal limits. Osteopenia. Chronic lower thoracic or upper lumbar compression fracture is stable since 2015. No acute osseous abnormality identified. Stable cholecystectomy clips. Negative visible bowel gas pattern. IMPRESSION: 1. Chronic but increased bilateral pulmonary interstitial opacity. Top differential considerations are viral/atypical respiratory infection (mildly favored in light of no pleural fluid), and acute pulmonary edema. 2. Chronic cardiomegaly and pulmonary hyperinflation. Electronically Signed   By: Althea Grimmer.D.  On: 03/20/2017 14:58   Dg Chest Portable 1 View  Result Date: 03/31/2017 CLINICAL DATA:  84 year old male with history of shortness of breath, fever and headache. Runny nose since yesterday. EXAM: PORTABLE CHEST 1 VIEW COMPARISON:  Chest x-ray 03/20/2017. FINDINGS: There is cephalization of the pulmonary vasculature, indistinctness of the interstitial markings, and patchy airspace disease throughout the lungs bilaterally suggestive of moderate pulmonary edema. Small to moderate bilateral pleural effusions. Mild cardiomegaly. The patient is rotated to the left on today's exam, resulting in distortion of the mediastinal contours and reduced diagnostic  sensitivity and specificity for mediastinal pathology. Atherosclerosis in the thoracic aorta. IMPRESSION: 1. The appearance the chest suggests moderate congestive heart failure, as above. 2. Aortic atherosclerosis. Electronically Signed   By: Trudie Reedaniel  Entrikin M.D.   On: 03/31/2017 16:13   [place] Abdomen Limited Ruq  Result Date: 04/01/2017 CLINICAL DATA:  Abnormal LFTs EXAM: ULTRASOUND ABDOMEN LIMITED RIGHT UPPER QUADRANT COMPARISON:  CT abdomen and pelvis 12/31/2016 FINDINGS: Gallbladder: Surgically absent Common bile duct: Diameter: 3 mm diameter, normal Liver: Mildly heterogeneous echogenicity, generally isoechoic to RIGHT kidney. No focal hepatic mass or nodularity. Portal vein is patent on color Doppler imaging with normal direction of blood flow towards the liver. No RIGHT upper quadrant free fluid. IMPRESSION: Post cholecystectomy. No focal hepatobiliary sonographic abnormalities identified. Electronically Signed   By: Ulyses SouthwardMark  Boles M.D.   On: 04/01/2017 08:39    Time Spent in minutes  25   Kirby Cortese M.D on 04/02/2017 at 10:34 AM  Between 7am to 7pm - Pager - [phone]  After 7pm go to [email or web address] - password [email or web address]  [facility] -  Office  [phone]

## 2017-04-02 NOTE — Progress Notes (Signed)
Progress Note  Patient Name: Jeffrey Mccarty Date of Encounter: 04/02/2017  Primary Cardiologist: Dina RichBranch, Kerri Kovacik, MD   Subjective   SOB improving  Inpatient Medications    Scheduled Meds: . apixaban  2.5 mg Oral BID  . digoxin  125 mcg Oral QODAY  . feeding supplement (ENSURE ENLIVE)  237 mL Oral BID BM  . furosemide  40 mg Intravenous Daily  . levothyroxine  75 mcg Oral QAC breakfast  . loratadine  10 mg Oral Daily  . mouth rinse  15 mL Mouth Rinse BID  . pantoprazole  40 mg Oral Daily  . potassium chloride SA  20 mEq Oral Daily  . sodium chloride flush  3 mL Intravenous Q12H   Continuous Infusions: . sodium chloride 250 mL (03/31/17 2126)  . cefTRIAXone (ROCEPHIN)  IV Stopped (04/01/17 2130)   PRN Meds: sodium chloride, acetaminophen **OR** acetaminophen, methocarbamol, psyllium, sodium chloride flush   Vital Signs    Vitals:   04/02/17 0200 04/02/17 0300 04/02/17 0400 04/02/17 0500  BP: 96/69 (!) 112/58 (!) 125/56 120/75  Pulse: 82 66 89 72  Resp: (!) 27 (!) 29 19 19   Temp:   98.5 F (36.9 C)   TempSrc:   Oral   SpO2: 91% 92% (!) 82% 90%  Weight:    102 lb 4.7 oz (46.4 kg)  Height:        Intake/Output Summary (Last 24 hours) at 04/02/2017 0809 Last data filed at 04/02/2017 0500 Gross per 24 hour  Intake -  Output 1600 ml  Net -1600 ml   Filed Weights   03/31/17 2037 04/01/17 0500 04/02/17 0500  Weight: 104 lb 0.9 oz (47.2 kg) 102 lb 4.7 oz (46.4 kg) 102 lb 4.7 oz (46.4 kg)    Telemetry    Rate controlled afib - Personally Reviewed  ECG    na  Physical Exam   GEN: No acute distress.   Neck: elevated JVD Cardiac: irreg, 3/6 systolic murmur rusb Respiratory: Clear to auscultation bilaterally. GI: Soft, nontender, non-distended  MS: No edema; No deformity. Neuro:  Nonfocal  Psych: Normal affect   Labs    Chemistry Recent Labs  Lab 03/31/17 1558 04/01/17 0247 04/02/17 0406  NA 139 140 139  K 4.4 3.3* 4.0  CL 102 101 97*  CO2 24 27  30   GLUCOSE 110* 82 93  BUN 44* 40* 42*  CREATININE 1.72* 1.59* 1.67*  CALCIUM 9.1 8.5* 8.8*  PROT 7.4 6.8  --   ALBUMIN 3.7 3.2*  --   AST 61* 48*  --   ALT 54 46  --   ALKPHOS 64 53  --   BILITOT 0.9 0.7  --   GFRNONAA 33* 36* 34*  GFRAA 38* 41* 39*  ANIONGAP 13 12 12      Hematology Recent Labs  Lab 03/31/17 1558 04/01/17 0248  WBC 8.1 7.0  RBC 4.14* 3.88*  HGB 11.7* 11.0*  HCT 35.9* 33.9*  MCV 86.7 87.4  MCH 28.3 28.4  MCHC 32.6 32.4  RDW 18.1* 17.9*  PLT 255 235    Cardiac Enzymes Recent Labs  Lab 03/31/17 1558 03/31/17 2101 04/01/17 0247 04/01/17 0845  TROPONINI 0.09* 0.10* 0.10* 0.09*   No results for input(s): TROPIPOC in the last 168 hours.   BNP Recent Labs  Lab 03/31/17 1558  BNP 2,093.0*     DDimer No results for input(s): DDIMER in the last 168 hours.   Radiology    Dg Chest Portable 1 View  Result Date: 03/31/2017 CLINICAL DATA:  84 year old male with history of shortness of breath, fever and headache. Runny nose since yesterday. EXAM: PORTABLE CHEST 1 VIEW COMPARISON:  Chest x-ray 03/20/2017. FINDINGS: There is cephalization of the pulmonary vasculature, indistinctness of the interstitial markings, and patchy airspace disease throughout the lungs bilaterally suggestive of moderate pulmonary edema. Small to moderate bilateral pleural effusions. Mild cardiomegaly. The patient is rotated to the left on today's exam, resulting in distortion of the mediastinal contours and reduced diagnostic sensitivity and specificity for mediastinal pathology. Atherosclerosis in the thoracic aorta. IMPRESSION: 1. The appearance the chest suggests moderate congestive heart failure, as above. 2. Aortic atherosclerosis. Electronically Signed   By: Trudie Reed M.D.   On: 03/31/2017 16:13   US Abdomen Limited Ruq  Result Date: 04/01/2017 CLINICAL DATA:  Abnormal LFTs EXAM: ULTRASOUND ABDOMEN LIMITED RIGHT UPPER QUADRANT COMPARISON:  CT abdomen and pelvis 12/31/2016  FINDINGS: Gallbladder: Surgically absent Common bile duct: Diameter: 3 mm diameter, normal Liver: Mildly heterogeneous echogenicity, generally isoechoic to RIGHT kidney. No focal hepatic mass or nodularity. Portal vein is patent on color Doppler imaging with normal direction of blood flow towards the liver. No RIGHT upper quadrant free fluid. IMPRESSION: Post cholecystectomy. No focal hepatobiliary sonographic abnormalities identified. Electronically Signed   By: Ulyses Southward M.D.   On: 04/01/2017 08:39    Cardiac Studies     Patient Profile     Jeffrey Mccarty is a 84 y.o. male with a hx of chronic sysotlic/diastolc HF who is being seen today for the evaluation of SOB at the request of Dr Gonzella Lex.    Assessment & Plan    1. Acute on chronic sysotlic/diastolic HF - presents with symptoms of volume overload, pulm edema on CXR, increased BNP - negative 1.6 liters overnight, negative 4.1 liters since admission. He received lasix 40mg  IV x 1 yesterday. Mild fluctuations in renal function - 03/2017 echo shows decrease in LVE 40-45% from 07/2015 echo - medical therapy limited due to soft bp's, has not tolerated beta blocker in the past.  - continue diuresis. With his poor renal function and advanced age poor cath candidate, in the past he had made clear he did not want invasive procedures as well  - remains fluid overloaded, will dose IV lasix 40mg  bid today and reevaluate diuretic dosing tomorrow AM.   2. Afib - has not tolerated beta blockers of CCBs in the past due to low bp's - actually has been on just every other day digoxin and done well - remains on eliquis for stroke prevention   3. Aortic stenosis - moderate by last echo, not significant to be etiology of his drop in LVEF or symptoms - patient and family have made clear they would not want intervention in the future.     For questions or updates, please contact [facility] Please consult [email or web address] for contact info  under Cardiology/STEMI.      Joanie Coddington, MD  04/02/2017, 8:09 AM

## 2017-04-02 NOTE — Evaluation (Signed)
Physical Therapy Evaluation Patient Details Name: Jeffrey Mccarty MRN: [id number] DOB: 10-26-1921 Today's Date: 04/02/2017   History of Present Illness  Jeffrey Mccarty  is a 84 y.o. male, w PMR, Copd, hypertension, Pafib, CHF (EF 40-45%), mild-mod AS, mod MR, apparently has had increase in lower ext edema starting about 2 weeks ago and his lasix was increased from 20mg  to 40mg  po qday by cardiology.  Pt today apparently was not feeling well, had c/o sore throat, dyspnea, slight cough, dry.  Slight epigastric discomfort,  No fever, no chills, no chest pain, denies n/v, diarrhea, brbpr, black stool.  Pt was brought to ED for evaluation of sob.     Clinical Impression  Patient limited to a few unsteady steps using RW, near fall when attempting without assistive device, mostly limited due to SOB, generalized weakness, and poor standing balance.  Patient will benefit from continued physical therapy in hospital and recommended venue below to increase strength, balance, endurance for safe ADLs and gait.    Follow Up Recommendations SNF;Supervision/Assistance - 24 hour    Equipment Recommendations  None recommended by PT    Recommendations for Other Services       Precautions / Restrictions Precautions Precautions: Fall Restrictions Weight Bearing Restrictions: No      Mobility  Bed Mobility Overal bed mobility: Needs Assistance Bed Mobility: Supine to Sit     Supine to sit: Min assist        Transfers Overall transfer level: Needs assistance Equipment used: Rolling walker (2 wheeled) Transfers: Sit to/from [employer] Sit to Stand: Mod assist Stand pivot transfers: Mod assist          Ambulation/Gait Ambulation/Gait assistance: Mod assist Ambulation Distance (Feet): 3 Feet Assistive device: Rolling walker (2 wheeled) Gait Pattern/deviations: Decreased step length - right;Decreased step length - left;Decreased stride length   Gait velocity interpretation:  Below normal speed for age/gender General Gait Details: limited to 3-4 steps due to poor standing balance and very SOB with O2 desaturation with exertion  Stairs            Wheelchair Mobility    Modified Rankin (Stroke Patients Only)       Balance Overall balance assessment: Needs assistance Sitting-balance support: No upper extremity supported;Feet supported Sitting balance-Leahy Scale: Fair     Standing balance support: No upper extremity supported;During functional activity Standing balance-Leahy Scale: Poor Standing balance comment: fair with RW                             Pertinent Vitals/Pain Pain Assessment: No/denies pain    Home Living Family/patient expects to be discharged to:: Private residence Living Arrangements: Alone Available Help at Discharge: Family;Available PRN/intermittently Type of Home: House Home Access: Stairs to enter Entrance Stairs-Rails: None Entrance Stairs-Number of Steps: 1 Home Layout: One level Home Equipment: Cane - quad;Shower seat;Walker - 2 wheels      Prior Function Level of Independence: Independent               Hand Dominance        Extremity/Trunk Assessment   Upper Extremity Assessment Upper Extremity Assessment: Generalized weakness    Lower Extremity Assessment Lower Extremity Assessment: Generalized weakness    Cervical / Trunk Assessment Cervical / Trunk Assessment: Normal  Communication   Communication: HOH  Cognition Arousal/Alertness: Awake/alert Behavior During Therapy: WFL for tasks assessed/performed Overall Cognitive Status: Within Functional Limits for tasks assessed  General Comments      Exercises     Assessment/Plan    PT Assessment Patient needs continued PT services  PT Problem List Decreased strength;Decreased activity tolerance;Decreased balance;Decreased mobility       PT Treatment Interventions  Gait training;Therapeutic activities;Functional mobility training;Therapeutic exercise;Patient/family education    PT Goals (Current goals can be found in the Care Plan section)  Acute Rehab PT Goals Patient Stated Goal: return home PT Goal Formulation: With patient/family Time For Goal Achievement: 04/16/17 Potential to Achieve Goals: Good    Frequency Min 3X/week   Barriers to discharge        Co-evaluation               AM-PAC PT "6 Clicks" Daily Activity  Outcome Measure Difficulty turning over in bed (including adjusting bedclothes, sheets and blankets)?: A Little Difficulty moving from lying on back to sitting on the side of the bed? : A Little Difficulty sitting down on and standing up from a chair with arms (e.g., wheelchair, bedside commode, etc,.)?: A Lot Help needed moving to and from a bed to chair (including a wheelchair)?: A Lot Help needed walking in hospital room?: A Lot Help needed climbing 3-5 steps with a railing? : A Lot 6 Click Score: 14    End of Session   Activity Tolerance: Patient limited by fatigue(Patient limited secondary to SOB) Patient left: in chair;with call bell/phone within reach;with family/visitor present Nurse Communication: Mobility status(RN notified patient left in chair) PT Visit Diagnosis: Unsteadiness on feet (R26.81);Other abnormalities of gait and mobility (R26.89);Muscle weakness (generalized) (M62.81)    Time: [id number] PT Time Calculation (min) (ACUTE ONLY): 24 min   Charges:   PT Evaluation $PT Eval Moderate Complexity: 1 Mod PT Treatments $Therapeutic Activity: 23-37 mins   PT G Codes:        4:00 PM, 04/02/17 Jeffrey Mccarty, MPT Physical Therapist with [facility] [phone] office [phone] mobile phone

## 2017-04-02 NOTE — Plan of Care (Signed)
  Acute Rehab PT Goals(only PT should resolve) Pt Will Go Supine/Side To Sit 04/02/2017 1601 - Progressing by Ocie BobWatkins, Tarrell Debes, PT Flowsheets Taken 04/02/2017 1601  Pt will go Supine/Side to Sit with supervision Patient Will Transfer Sit To/From Stand 04/02/2017 1601 - Progressing by Ocie BobWatkins, Jayr Lupercio, PT Flowsheets Taken 04/02/2017 1601  Patient will transfer sit to/from stand with min guard assist Pt Will Transfer Bed To Chair/Chair To Bed 04/02/2017 1601 - Progressing by Ocie BobWatkins, Timberlee Roblero, PT Flowsheets Taken 04/02/2017 1601  Pt will Transfer Bed to Chair/Chair to Bed min guard assist Pt Will Ambulate 04/02/2017 1601 - Progressing by Ocie BobWatkins, Zacaria Pousson, PT Flowsheets Taken 04/02/2017 1601  Pt will Ambulate 50 feet;with minimal assist;with rolling walker  4:02 PM, 04/02/17 Ocie BobJames Tresia Revolorio, MPT Physical Therapist [facility] [phone] office [phone] mobile phone

## 2017-04-03 DIAGNOSIS — L89151 Pressure ulcer of sacral region, stage 1: Secondary | ICD-10-CM

## 2017-04-03 DIAGNOSIS — I482 Chronic atrial fibrillation: Secondary | ICD-10-CM

## 2017-04-03 DIAGNOSIS — K7689 Other specified diseases of liver: Secondary | ICD-10-CM

## 2017-04-03 DIAGNOSIS — I5043 Acute on chronic combined systolic (congestive) and diastolic (congestive) heart failure: Secondary | ICD-10-CM

## 2017-04-03 DIAGNOSIS — R748 Abnormal levels of other serum enzymes: Secondary | ICD-10-CM

## 2017-04-03 DIAGNOSIS — N289 Disorder of kidney and ureter, unspecified: Secondary | ICD-10-CM

## 2017-04-03 LAB — BASIC METABOLIC PANEL
Anion gap: 14 (ref 5–15)
BUN: 41 mg/dL — AB (ref 6–20)
CHLORIDE: 96 mmol/L — AB (ref 101–111)
CO2: 29 mmol/L (ref 22–32)
Calcium: 8.7 mg/dL — ABNORMAL LOW (ref 8.9–10.3)
Creatinine, Ser: 1.59 mg/dL — ABNORMAL HIGH (ref 0.61–1.24)
GFR calc Af Amer: 41 mL/min — ABNORMAL LOW (ref >60)
GFR calc non Af Amer: 36 mL/min — ABNORMAL LOW (ref >60)
GLUCOSE: 90 mg/dL (ref 65–99)
POTASSIUM: 3.3 mmol/L — AB (ref 3.5–5.1)
Sodium: 139 mmol/L (ref 135–145)

## 2017-04-03 MED ORDER — FUROSEMIDE 10 MG/ML IJ SOLN
40.0000 mg | Freq: Two times a day (BID) | INTRAMUSCULAR | Status: AC
  Administered 2017-04-03 (×2): 40 mg via INTRAVENOUS
  Filled 2017-04-03 (×3): qty 4

## 2017-04-03 MED ORDER — POTASSIUM CHLORIDE CRYS ER 20 MEQ PO TBCR
30.0000 meq | EXTENDED_RELEASE_TABLET | Freq: Every day | ORAL | Status: DC
  Administered 2017-04-03: 11:00:00 30 meq via ORAL
  Filled 2017-04-03 (×2): qty 1

## 2017-04-03 NOTE — Plan of Care (Signed)
  SLP Dysphagia Goals Patient will utilize recommended strategies Description Patient will utilize recommended strategies during swallow to increase swallowing safety with 04/03/2017 1033 by Dorene ArPorter, Keelie Zemanek V, CCC-SLP Flowsheets Taken 04/03/2017 1033  Patient will utilize recommended strategies during swallow to increase swallowing safety with  min assist  Thank you,  Havery MorosDabney Johngabriel Verde, CCC-SLP [phone]

## 2017-04-03 NOTE — Plan of Care (Signed)
Not progressing 

## 2017-04-03 NOTE — Clinical Social Work Note (Signed)
Received CSW consult for SNF placement. RN CM, Aviva SignsSharley, informs LCSW that pt[facility] Aviva SignsSharley will follow for pt's dc needs and CSW consult will be cleared.

## 2017-04-03 NOTE — Care Management Important Message (Signed)
Important Message  Patient Details  Name: Jeffrey Mccarty MRN: [id number] Date of Birth: August 14, 1921   Medicare Important Message Given:  Yes    Davina Howlett, Chrystine OilerSharley Diane, RN 04/03/2017, 1:27 PM

## 2017-04-03 NOTE — Care Management Note (Signed)
Case Management Note  Patient Details  Name: Jeffrey Mccarty MRN: [id number] Date of Birth: April 03, 1921  Subjective/Objective:   Adm with CHF. From home alone with lots of family support. He lives alone but daughters check on him multiple times a day and will spend the night if needed. Patient has [facility] the [facility] and family feels that SNF would not be a good fit for him. They plan to offer whatever support patient needs and start home health services again.                 Action/Plan: DC home in care of daughter and home health. Will need HH orders. [facility] notified and will obtain orders when available. Will need PT and SLP.   Expected Discharge Date:  04/04/17               Expected Discharge Plan:  Home w Home Health Services  In-House Referral:     Discharge planning Services  CM Consult  Post Acute Care Choice:  Home Health Choice offered to:  Adult Children, Patient  DME Arranged:    DME Agency:     HH Arranged:  PT, Speech Therapy HH Agency:  [facility]  Status of Service:  In process, will continue to follow  If discussed at Long Length of Stay Meetings, dates discussed:    Additional Comments:  Tejal Monroy, Chrystine OilerSharley Diane, RN 04/03/2017, 1:16 PM

## 2017-04-03 NOTE — Progress Notes (Signed)
Physical Therapy Treatment Patient Details Name: Jeffrey Mccarty MRN: [id number] DOB: 1921-08-06 Today's Date: 04/03/2017    History of Present Illness Jeffrey Mccarty  is a 84 y.o. male, w PMR, Copd, hypertension, Pafib, CHF (EF 40-45%), mild-mod AS, mod MR, apparently has had increase in lower ext edema starting about 2 weeks ago and his lasix was increased from 20mg  to 40mg  po qday by cardiology.  Pt today apparently was not feeling well, had c/o sore throat, dyspnea, slight cough, dry.  Slight epigastric discomfort,  No fever, no chills, no chest pain, denies n/v, diarrhea, brbpr, black stool.  Pt was brought to ED for evaluation of sob.     PT Comments    Pt demonstrating big improvement in functional mobility this session, now tolerating AMB up to 2650ft, improved stability, no physical assistance needed. Pt continues to have increased breathlessness with activity, associated with AMB as well as bed mobility, and transfers x8 at bedside. PT still recommending DC to STR to return to independent household mobility, but family and patient are not in favor. Patient will require 24/7 supervision at DC and PT recommending a transport chair for longer household distances until patient recovers strength and activity tolerance similar to PLOF.    Follow Up Recommendations  SNF;Supervision/Assistance - 24 hour(family deciding against recommendations, plan is for DC to home. )     Equipment Recommendations  Other (comment)(transport chair (4 small wheels), elevated leg rests if possible)    Recommendations for Other Services       Precautions / Restrictions Precautions Precautions: Fall Restrictions Weight Bearing Restrictions: No    Mobility  Bed Mobility Overal bed mobility: Needs Assistance Bed Mobility: Supine to Sit     Supine to sit: Min assist;Min guard     General bed mobility comments: most hand held assist for confidence, moves well today.   Transfers Overall transfer level:  Needs assistance Equipment used: None Transfers: Sit to/from Stand Sit to Stand: Min guard;Supervision         General transfer comment: MinGuardA  Ambulation/Gait Ambulation/Gait assistance: Min guard Ambulation Distance (Feet): 50 Feet Assistive device: None;1 person hand held assist     Gait velocity interpretation: <1.8 ft/sec, indicative of risk for recurrent falls General Gait Details: shuffling, flexed forward; good confidence, no LOB with turns Journalist, newspaperx9    Stairs            Wheelchair Mobility    Modified Rankin (Stroke Patients Only)       Balance                                            Cognition Arousal/Alertness: Awake/alert Behavior During Therapy: WFL for tasks assessed/performed Overall Cognitive Status: Within Functional Limits for tasks assessed                                        Exercises General Exercises - Lower Extremity Heel Slides: 15 reps;Both;AROM;Supine(Tactile cues required, severely HOH. ) Hip ABduction/ADduction: AROM;Both;15 reps;Supine(Tactile cues required, severely HOH. ) Straight Leg Raises: AROM;Supine;Both;15 reps(Tactile cues required, severely HOH. )    General Comments        Pertinent Vitals/Pain Pain Assessment: No/denies pain    Home Living  Prior Function            PT Goals (current goals can now be found in the care plan section) Acute Rehab PT Goals Patient Stated Goal: return home PT Goal Formulation: With patient/family Time For Goal Achievement: 04/16/17 Potential to Achieve Goals: Good Progress towards PT goals: Progressing toward goals    Frequency    Min 3X/week      PT Plan      Co-evaluation              AM-PAC PT "6 Clicks" Daily Activity  Outcome Measure  Difficulty turning over in bed (including adjusting bedclothes, sheets and blankets)?: A Little Difficulty moving from lying on back to sitting on the  side of the bed? : A Little Difficulty sitting down on and standing up from a chair with arms (e.g., wheelchair, bedside commode, etc,.)?: A Little Help needed moving to and from a bed to chair (including a wheelchair)?: A Little Help needed walking in hospital room?: A Little Help needed climbing 3-5 steps with a railing? : A Lot 6 Click Score: 17    End of Session Equipment Utilized During Treatment: Oxygen Activity Tolerance: Patient limited by fatigue;Treatment limited secondary to medical complications (Comment)(limited by breathlessness, but no hypoxia, SpO2: 100% throughout. ) Patient left: in chair;with call bell/phone within reach;with family/visitor present Nurse Communication: Mobility status PT Visit Diagnosis: Unsteadiness on feet (R26.81);Other abnormalities of gait and mobility (R26.89);Muscle weakness (generalized) (M62.81)     Time: 1610-9604 PT Time Calculation (min) (ACUTE ONLY): 29 min  Charges:  $Therapeutic Exercise: 8-22 mins $Therapeutic Activity: 8-22 mins                    G Codes:       3:52 PM, 04/14/2017 Rosamaria Lints, PT, DPT Physical Therapist - [place] [phone] (Office)      Ly Bacchi C April 14, 2017, 3:46 PM

## 2017-04-03 NOTE — Progress Notes (Signed)
PROGRESS NOTE                                                                                                                                                                                                             Patient Demographics:    Jeffrey Mccarty, is a 84 y.o. male, DOB - 03/15/1921, VHQ:[id number]  Admit date - 03/31/2017   Admitting Physician Pearson Grippe, MD  Outpatient Primary MD for the patient is Gerda Diss Vilinda Blanks, MD  LOS - 3  Outpatient Specialists: Dr Wyline Mood  Chief Complaint  Patient presents with  . Shortness of Breath       Brief Narrative   83 year old male with history of A. fib on anticoagulation, chronic kidney disease stage III, chronic combined systolic and diastolic CHF (EF of 40-45%), mild to moderate left ear and moderate MR, chronic indwelling Foley, who presented from home with increasing lower leg edema for 2 weeks duration.  His Lasix dose was increased from 20 mg daily to 40 mg daily by his cardiology.  Patient reported some epigastric discomfort but no fevers, chills, chest pain, abdominal pain, nausea, vomiting, bowel or urinary symptoms. In the ED vitals were stable with blood work showing BUN of 44, creatinine 1.72, mild transaminitis, hemoglobin of 11.7, troponin of 0.09, BNP of 2000, UA positive for UTI and EKG showing A. fib with ST depression in lateral leads.  Chest x-ray showed findings of CHF. Patient admitted to stepdown unit for further management.    Subjective:  Afebrile, in no major distress. With overall improvement in breathing and volume. No CP   Assessment  & Plan :    Principal Problem: Acute on chronic combined systolic and diastolic CHF (HCC) On IV Lasix 40 mg daily with good diuresis.  (-5.5 L since admission).   -over 10 pounds off -follow cardiology rec's to transition diuretic regimen and with decision for outpatient therapy -continue daily weight -continue low  sodium diet and strict intake ns output   Atrial fibrillation, chronic (HCC) -Rate controlled.   -continue digoxin every other day -continue eliquis   elevated troponin.   -Suspect demand ischemia.   -Continue aspirin and statin.   -Echo done last week so does not need to be repeated.  Hypotension -Chronic.  Blood pressure stable overnight, even soft. -Monitor while on IV Lasix.  Chronic kidney  disease stage III: with hypokalemia and hypomagnesemia  -Renal function at baseline. -follow trend (especially with active diuresis) -follow electrolytes and replete them as needed   ?  UTI At high risk for recurrent infections due to chronic foley catheter  -empirically on Rocephin initially -no acute infection findings appreciated -antibiotics discontinued  Transaminitis -Mild.   -Ultrasound without acute findings. -follow LFT's -most likely associated with passive live congestion from CHF.  Dysphagia -continue rec's by speech therapy  -family reporting slight improvement in intake and not associated coughing spells.  Hypothyroidism -Continue Synthroid.  Pressure injury of skin -stage 1 present prior to admission  -continue prevention measures    urinary retention with chronic indwelling foley -S/p foley exchange on 3/5 -no complaints and good urine output appreciated  Code Status : DNR  Family Communication  :  Daughter at bedside  Disposition Plan  : improved volume status; PT recommending SNF. Will follow rec's by cardiology service regarding diuresis therapy. VS stable and so far BP tolerating diuresis.  Barriers For Discharge : Active symptoms  Consults  :  Cardiology  Procedures  : None  DVT Prophylaxis  : Eliquis  Lab Results  Component Value Date   PLT 235 04/01/2017    Antibiotics  :    Anti-infectives (From admission, onward)   Start     Dose/Rate Route Frequency Ordered Stop   03/31/17 2000  cefTRIAXone (ROCEPHIN) 1 g in sodium chloride 0.9 %  100 mL IVPB  Status:  Discontinued     1 g 200 mL/hr over 30 Minutes Intravenous Every 24 hours 03/31/17 1941 04/02/17 1041        Objective:   Vitals:   04/03/17 0500 04/03/17 0622 04/03/17 1300 04/03/17 1534  BP:  (!) 100/52 109/60   Pulse:  (!) 52 61 (!) 110  Resp:  20 (!) 21   Temp:  98.4 F (36.9 C) 98 F (36.7 C)   TempSrc:  Oral Oral   SpO2:  96% 98% 100%  Weight: 46.5 kg (102 lb 8.2 oz)     Height:        Wt Readings from Last 3 Encounters:  04/03/17 46.5 kg (102 lb 8.2 oz)  03/25/17 49.5 kg (109 lb 3.2 oz)  03/20/17 51.4 kg (113 lb 6.4 oz)    Intake/Output Summary (Last 24 hours) at 04/03/2017 2114 Last data filed at 04/03/2017 1700 Gross per 24 hour  Intake 820 ml  Output 2350 ml  Net -1530 ml     Physical Exam General: elderly man, hard of hearing; in no major distress. Denying CP and resting comfortable prior to Lunch. No CP. Per family, breathing is improved. Chest: no frank crackles heard; decrease BS at bases, no wheezing CVS: rate controlled, positive S3, positive SEM, no rubs. GI: soft, NT, ND, positive BS GUN: chronic Foley in place  Musculoskeletal: no edema, no cyanosis  Skin: stage 1 sacrum pressure injury, present PTOA; no surrounding erythema or breakdown.     Data Review:    CBC Recent Labs  Lab 03/31/17 1558 04/01/17 0248  WBC 8.1 7.0  HGB 11.7* 11.0*  HCT 35.9* 33.9*  PLT 255 235  MCV 86.7 87.4  MCH 28.3 28.4  MCHC 32.6 32.4  RDW 18.1* 17.9*  LYMPHSABS 1.0  --   MONOABS 0.9  --   EOSABS 0.0  --   BASOSABS 0.0  --     Chemistries  Recent Labs  Lab 03/31/17 1558 04/01/17 0247 04/02/17 0406 04/03/17 0444  NA 139 140 139 139  K 4.4 3.3* 4.0 3.3*  CL 102 101 97* 96*  CO2 24 27 30 29   GLUCOSE 110* 82 93 90  BUN 44* 40* 42* 41*  CREATININE 1.72* 1.59* 1.67* 1.59*  CALCIUM 9.1 8.5* 8.8* 8.7*  MG  --   --  2.0  --   AST 61* 48*  --   --   ALT 54 46  --   --   ALKPHOS 64 53  --   --   BILITOT 0.9 0.7  --   --     ------------------------------------------------------------------------------------------------------------------ Recent Labs    04/01/17 0247  CHOL 123  HDL 55  LDLCALC 60  TRIG 39  CHOLHDL 2.2    Cardiac Enzymes Recent Labs  Lab 03/31/17 2101 04/01/17 0247 04/01/17 0845  TROPONINI 0.10* 0.10* 0.09*   ------------------------------------------------------------------------------------------------------------------    Component Value Date/Time   BNP 2,093.0 (H) 03/31/2017 1558    Inpatient Medications  Scheduled Meds: . apixaban  2.5 mg Oral BID  . digoxin  125 mcg Oral QODAY  . feeding supplement (ENSURE ENLIVE)  237 mL Oral BID BM  . levothyroxine  75 mcg Oral QAC breakfast  . loratadine  10 mg Oral Daily  . mouth rinse  15 mL Mouth Rinse BID  . pantoprazole  40 mg Oral Daily  . potassium chloride SA  30 mEq Oral Daily  . sodium chloride flush  3 mL Intravenous Q12H   Continuous Infusions: . sodium chloride 250 mL (03/31/17 2126)   PRN Meds:.sodium chloride, acetaminophen **OR** acetaminophen, methocarbamol, psyllium, sodium chloride flush  Micro Results Recent Results (from the past 240 hour(s))  MRSA PCR Screening     Status: None   Collection Time: 03/31/17  8:00 PM  Result Value Ref Range Status   MRSA by PCR NEGATIVE NEGATIVE Final    Comment:        The GeneXpert MRSA Assay (FDA approved for NASAL specimens only), is one component of a comprehensive MRSA colonization surveillance program. It is not intended to diagnose MRSA infection nor to guide or monitor treatment for MRSA infections. Performed at [facility], [address], [place], [place] [address]     Radiology Reports Dg Chest 2 View  Result Date: 03/20/2017 CLINICAL DATA:  84 year old male with shortness of breath and wheezing. EXAM: CHEST  2 VIEW COMPARISON:  12/31/2016 and earlier. FINDINGS: Chronic cardiomegaly appears stable since 2015. Other mediastinal contours are stable.  Chronic pulmonary hyperinflation with increased AP dimension to the chest. Chronic but increased diffuse bilateral pulmonary interstitial opacity. No pleural effusion or pleural fluid is visible. No pneumothorax or confluent pulmonary opacity. Visualized tracheal air column is within normal limits. Osteopenia. Chronic lower thoracic or upper lumbar compression fracture is stable since 2015. No acute osseous abnormality identified. Stable cholecystectomy clips. Negative visible bowel gas pattern. IMPRESSION: 1. Chronic but increased bilateral pulmonary interstitial opacity. Top differential considerations are viral/atypical respiratory infection (mildly favored in light of no pleural fluid), and acute pulmonary edema. 2. Chronic cardiomegaly and pulmonary hyperinflation. Electronically Signed   By: Odessa Fleming M.D.   On: 03/20/2017 14:58   Dg Chest Portable 1 View  Result Date: 03/31/2017 CLINICAL DATA:  84 year old male with history of shortness of breath, fever and headache. Runny nose since yesterday. EXAM: PORTABLE CHEST 1 VIEW COMPARISON:  Chest x-ray 03/20/2017. FINDINGS: There is cephalization of the pulmonary vasculature, indistinctness of the interstitial markings, and patchy airspace disease throughout the lungs bilaterally suggestive of  moderate pulmonary edema. Small to moderate bilateral pleural effusions. Mild cardiomegaly. The patient is rotated to the left on today's exam, resulting in distortion of the mediastinal contours and reduced diagnostic sensitivity and specificity for mediastinal pathology. Atherosclerosis in the thoracic aorta. IMPRESSION: 1. The appearance the chest suggests moderate congestive heart failure, as above. 2. Aortic atherosclerosis. Electronically Signed   By: Trudie Reed M.D.   On: 03/31/2017 16:13   Dg Swallowing Func-speech Pathology  Result Date: 04/02/2017 Objective Swallowing Evaluation: Type of Study: MBS-Modified Barium Swallow Study  Patient Details Name: JIA MOHAMED MRN: [id number] Date of Birth: 1921-11-07 Today's Date: 04/02/2017 Time: SLP Start Time (ACUTE ONLY): 1330 -SLP Stop Time (ACUTE ONLY): 1415 SLP Time Calculation (min) (ACUTE ONLY): 45 min Past Medical History: Past Medical History: Diagnosis Date . Aortic stenosis   a. moderate AS by echo in 04/2015 . Atrial fibrillation (HCC)   on Eliquis . Elevated PSA  . Emphysema lung (HCC)  . Hypertension   no longer on medication . Hypothyroid  . Insomnia  . Kidney stone  . Mitral regurgitation 03/25/2017  moderate . Polymyalgia rheumatica (HCC)   family is not aware of this diagnosis . Pressure ulcer  . Reflux  Past Surgical History: Past Surgical History: Procedure Laterality Date . APPENDECTOMY   . CHOLECYSTECTOMY   . THYROIDECTOMY   . TONSILLECTOMY   HPI: 84 year old male with history of A. fib on anticoagulation, chronic kidney disease stage III, chronic combined systolic and diastolic CHF (EF of 40-45%), mild to moderate left ear and moderate MR, chronic indwelling Foley, who presented from home with increasing lower leg edema for 2 weeks duration.  His Lasix dose was increased from 20 mg daily to 40 mg daily by his cardiology.  Patient reported some epigastric discomfort but no fevers, chills, chest pain, abdominal pain, nausea, vomiting, bowel or urinary symptoms.  Subjective: "Okay" Assessment / Plan / Recommendation CHL IP CLINICAL IMPRESSIONS 04/02/2017 Clinical Impression Pt presents with moderate oropharyngeal phase dysphagia characterized by weak lingual manipulation resulting in reduced bolus cohesiveness, anterior posterior transit, piecemeal deglutition, and lingual residuals; premature spillage with min delay in swallow initiation, reduced tongue base retraction, and epiglottic deflection resulting in vallecular residuals which then spill to pyriforms during repeat swallows (epiglottis never fully deflected during the study) and trace, variable penetration of nectars and thins during the swallow and  likely trace amounts after the swallow from vallecular residuals. Penetration was in trace amounts and generally observed tracking down the underside of the epiglottis, which only reached the vocal folds x1 and resulted in trace, silent aspiration during sequential straw sips of nectar. Effortful swallows were attempted and found to be ineffective to mitigate/reduce pharyngeal residuals with purees/mech soft. A liquid wash and several dry swallows were most effective in reducing pharyngeal residue. A cued cough followed by a dry swallow were also found to be effective. The trace amount of aspirated material (either NTL or thin residuals) was not seen tracking down the anterior tracheal wall and it is suspected that Pt removed it. Pt with brief retention of barium tablet near the valleculae and he was noted to spontaneously throw his head back (which family says is baseline) when swallowing it, likely trying to bypass valleculae as Pt with long, curled epiglottis which never fully deflected. Esophageal sweep was completed and initially showed barium retention in distal esophagus, which appeared to increase in amount following significant eructation. Backflow of barium noted. Pt reports h/o reflux. Pt is at moderate risk  for aspiration due to pharyngeal weakness and suspect some degree of esophageal phase dysphagia placing him at risk for post prandial aspiration. Because there was no great difference in nectars and thins, recommend D2/finely chopped and THIN liquids via small sips (cup or straw) with periodic cough and repeat swallows; alternate solids/liquids intermittently and adhere to reflux and general aspiration precautions. OK for po medications whole in puree. This study was reviewed with Pt and his daughter. Pt is very HOH, so will bring written strategies to room. SLP Visit Diagnosis Dysphagia, oropharyngeal phase (R13.12) Attention and concentration deficit following -- Frontal lobe and executive function  deficit following -- Impact on safety and function Moderate aspiration risk   CHL IP TREATMENT RECOMMENDATION 04/02/2017 Treatment Recommendations Therapy as outlined in treatment plan below   Prognosis 04/02/2017 Prognosis for Safe Diet Advancement Fair Barriers to Reach Goals Severity of deficits Barriers/Prognosis Comment work of breathing CHL IP DIET RECOMMENDATION 04/02/2017 SLP Diet Recommendations Dysphagia 2 (Fine chop) solids;Thin liquid Liquid Administration via Cup;Straw Medication Administration Whole meds with puree Compensations Slow rate;Small sips/bites;Multiple dry swallows after each bite/sip;Follow solids with liquid;Clear throat intermittently Postural Changes Remain semi-upright after after feeds/meals (Comment);Seated upright at 90 degrees   CHL IP OTHER RECOMMENDATIONS 04/02/2017 Recommended Consults Consider esophageal assessment Oral Care Recommendations Oral care BID;Staff/trained caregiver to provide oral care Other Recommendations Clarify dietary restrictions   CHL IP FOLLOW UP RECOMMENDATIONS 04/02/2017 Follow up Recommendations Skilled Nursing facility;24 hour supervision/assistance   CHL IP FREQUENCY AND DURATION 04/02/2017 Speech Therapy Frequency (ACUTE ONLY) min 2x/week Treatment Duration 1 week      CHL IP ORAL PHASE 04/02/2017 Oral Phase Impaired Oral - Pudding Teaspoon -- Oral - Pudding Cup -- Oral - Honey Teaspoon -- Oral - Honey Cup -- Oral - Nectar Teaspoon Lingual/palatal residue;Decreased bolus cohesion;Premature spillage Oral - Nectar Cup -- Oral - Nectar Straw -- Oral - Thin Teaspoon -- Oral - Thin Cup -- Oral - Thin Straw -- Oral - Puree Decreased bolus cohesion;Premature spillage;Piecemeal swallowing;Lingual/palatal residue Oral - Mech Soft Weak lingual manipulation;Impaired mastication;Lingual/palatal residue;Decreased bolus cohesion Oral - Regular -- Oral - Multi-Consistency -- Oral - Pill Reduced posterior propulsion Oral Phase - Comment --  CHL IP PHARYNGEAL PHASE 04/02/2017  Pharyngeal Phase Impaired Pharyngeal- Pudding Teaspoon -- Pharyngeal -- Pharyngeal- Pudding Cup -- Pharyngeal -- Pharyngeal- Honey Teaspoon -- Pharyngeal -- Pharyngeal- Honey Cup -- Pharyngeal -- Pharyngeal- Nectar Teaspoon Delayed swallow initiation-vallecula;Reduced epiglottic inversion;Reduced tongue base retraction;Penetration/Aspiration before swallow;Pharyngeal residue - valleculae;Pharyngeal residue - pyriform;Pharyngeal residue - posterior pharnyx;Lateral channel residue Pharyngeal Material does not enter airway;Material enters airway, remains ABOVE vocal cords then ejected out Pharyngeal- Nectar Cup -- Pharyngeal -- Pharyngeal- Nectar Straw Delayed swallow initiation-pyriform sinuses;Reduced epiglottic inversion;Reduced airway/laryngeal closure;Reduced tongue base retraction;Penetration/Aspiration during swallow;Trace aspiration;Pharyngeal residue - valleculae;Pharyngeal residue - pyriform;Lateral channel residue Pharyngeal Material enters airway, remains ABOVE vocal cords then ejected out;Material enters airway, passes BELOW cords without attempt by patient to eject out (silent aspiration) Pharyngeal- Thin Teaspoon Delayed swallow initiation-pyriform sinuses;Penetration/Aspiration during swallow;Reduced epiglottic inversion;Reduced airway/laryngeal closure;Reduced tongue base retraction;Pharyngeal residue - valleculae;Pharyngeal residue - pyriform Pharyngeal Material does not enter airway;Material enters airway, remains ABOVE vocal cords then ejected out;Material enters airway, remains ABOVE vocal cords and not ejected out Pharyngeal- Thin Cup Delayed swallow initiation-pyriform sinuses;Penetration/Aspiration during swallow;Reduced epiglottic inversion;Reduced airway/laryngeal closure;Reduced tongue base retraction;Pharyngeal residue - valleculae;Pharyngeal residue - pyriform Pharyngeal Material does not enter airway;Material enters airway, remains ABOVE vocal cords then ejected out Pharyngeal- Thin Straw  Delayed swallow initiation-pyriform sinuses;Reduced epiglottic inversion;Reduced airway/laryngeal  closure;Reduced tongue base retraction;Penetration/Aspiration during swallow;Pharyngeal residue - valleculae;Pharyngeal residue - pyriform Pharyngeal Material does not enter airway;Material enters airway, remains ABOVE vocal cords then ejected out;Material enters airway, remains ABOVE vocal cords and not ejected out Pharyngeal- Puree Delayed swallow initiation-vallecula;Reduced epiglottic inversion;Reduced tongue base retraction;Pharyngeal residue - valleculae Pharyngeal -- Pharyngeal- Mechanical Soft Delayed swallow initiation-vallecula;Reduced epiglottic inversion;Reduced tongue base retraction;Pharyngeal residue - valleculae Pharyngeal -- Pharyngeal- Regular -- Pharyngeal -- Pharyngeal- Multi-consistency -- Pharyngeal -- Pharyngeal- Pill Pharyngeal residue - valleculae;Reduced epiglottic inversion;Reduced tongue base retraction Pharyngeal -- Pharyngeal Comment reduced tongue base retraction and epiglottic deflection  CHL IP CERVICAL ESOPHAGEAL PHASE 04/02/2017 Cervical Esophageal Phase Impaired Pudding Teaspoon -- Pudding Cup -- Honey Teaspoon -- Honey Cup -- Nectar Teaspoon -- Nectar Cup -- Nectar Straw Esophageal backflow into the pharynx Thin Teaspoon -- Thin Cup -- Thin Straw -- Puree -- Mechanical Soft -- Regular -- Multi-consistency -- Pill -- Cervical Esophageal Comment Esophageal sweep revealed some backflow of contrast in distal esophagus No flowsheet data found. Thank you, Havery Moros, CCC-SLP [phone] PORTER,DABNEY 04/02/2017, 6:51 PM              US Abdomen Limited Ruq  Result Date: 04/01/2017 CLINICAL DATA:  Abnormal LFTs EXAM: ULTRASOUND ABDOMEN LIMITED RIGHT UPPER QUADRANT COMPARISON:  CT abdomen and pelvis 12/31/2016 FINDINGS: Gallbladder: Surgically absent Common bile duct: Diameter: 3 mm diameter, normal Liver: Mildly heterogeneous echogenicity, generally isoechoic to RIGHT kidney. No focal  hepatic mass or nodularity. Portal vein is patent on color Doppler imaging with normal direction of blood flow towards the liver. No RIGHT upper quadrant free fluid. IMPRESSION: Post cholecystectomy. No focal hepatobiliary sonographic abnormalities identified. Electronically Signed   By: Ulyses Southward M.D.   On: 04/01/2017 08:39    Time Spent in minutes  25   Vassie Loll M.D on 04/03/2017 at 9:14 PM  Between 7am to 7pm - Pager - [phone]  After 7pm go to [email or web address] - password [facility]  [facility] -  Office  [phone]

## 2017-04-03 NOTE — Progress Notes (Signed)
  Speech Language Pathology Treatment: Dysphagia  Patient Details Name: Jeffrey Mccarty MRN: [id number] DOB: 1922-01-22 Today's Date: 04/03/2017 Time: 1000-1021 SLP Time Calculation (min) (ACUTE ONLY): 21 min  Assessment / Plan / Recommendation Clinical Impression  Pt noted to be coughing with breakfast meal this AM as witnessed by cardiology. Upon SLP entering room, Pt's daughter was feeding Pt D2 breakfast of scrambled eggs and sausage (noted over-filled spoon and continuous presentations despite coughing). SLP asked daughter to stop feeding him the solids and provided puree as a replacement. Daughter also required cues to help control amount of liquids presented (asked her to pull the cup away as she was presenting to Pt).   Will downgrade to D1/puree and continue thin liquids. Swallow strategies and diet recommendations [facility] at [facility]. SLP also found Pt's other daughter, Jeffrey Mccarty an [employer] employee, and reviewed results of MBSS and current recommendations. [name] feels that Pt would "give up" if he went to SNF and prefer to take Pt home with assistance. She also shared that she always gives Pt soft foods at home and "he's done fine". SLP shared that results of MBSS and esophageal sweep indicate that his dysphagia is likely an acute on chronic problem and has been exacerbated by current medical decompensation. [name] indicates understanding. SLP will follow and share with case management that family is interested in Pt returning home with assistance.   HPI HPI: 84 year old male with history of A. fib on anticoagulation, chronic kidney disease stage III, chronic combined systolic and diastolic CHF (EF of 40-45%), mild to moderate left ear and moderate MR, chronic indwelling Foley, who presented from home with increasing lower leg edema for 2 weeks duration.  His Lasix dose was increased from 20 mg daily to 40 mg daily by his cardiology.  Patient reported some epigastric discomfort but no fevers, chills, chest  pain, abdominal pain, nausea, vomiting, bowel or urinary symptoms.      SLP Plan  Continue with current plan of care       Recommendations  Diet recommendations: Dysphagia 1 (puree);Thin liquid Liquids provided via: Cup;Straw Medication Administration: Crushed with puree Supervision: Patient able to self feed;Staff to assist with self feeding Compensations: Slow rate;Small sips/bites;Multiple dry swallows after each bite/sip;Follow solids with liquid;Clear throat intermittently Postural Changes and/or Swallow Maneuvers: Seated upright 90 degrees;Upright 30-60 min after meal                Oral Care Recommendations: Oral care BID;Staff/trained caregiver to provide oral care Follow up Recommendations: Home health SLP;24 hour supervision/assistance SLP Visit Diagnosis: Dysphagia, oropharyngeal phase (R13.12) Plan: Continue with current plan of care       Thank you,  Jeffrey Mccarty, CCC-SLP [phone]                 Jeffrey Mccarty 04/03/2017, 10:22 AM

## 2017-04-03 NOTE — Progress Notes (Addendum)
Progress Note  Patient Name: Jeffrey Mccarty Date of Encounter: 04/03/2017  Primary Cardiologist: Dina Rich, MD   Subjective   Reports breathing has improved. No chest pain or palpitations. Having breakfast this morning and coughing regularly. Reviewed Speech Therapy recommendations with the patient's daughter regarding alternating solids and liquids.   Inpatient Medications    Scheduled Meds: . apixaban  2.5 mg Oral BID  . digoxin  125 mcg Oral QODAY  . feeding supplement (ENSURE ENLIVE)  237 mL Oral BID BM  . levothyroxine  75 mcg Oral QAC breakfast  . loratadine  10 mg Oral Daily  . mouth rinse  15 mL Mouth Rinse BID  . pantoprazole  40 mg Oral Daily  . potassium chloride SA  20 mEq Oral Daily  . sodium chloride flush  3 mL Intravenous Q12H   Continuous Infusions: . sodium chloride 250 mL (03/31/17 2126)   PRN Meds: sodium chloride, acetaminophen **OR** acetaminophen, methocarbamol, psyllium, sodium chloride flush   Vital Signs    Vitals:   04/02/17 2011 04/02/17 2115 04/03/17 0500 04/03/17 0622  BP:  (!) 106/52  (!) 100/52  Pulse:  78  (!) 52  Resp:  18  20  Temp:  98.4 F (36.9 C)  98.4 F (36.9 C)  TempSrc:  Oral  Oral  SpO2: 95% 100%  96%  Weight:   102 lb 8.2 oz (46.5 kg)   Height:        Intake/Output Summary (Last 24 hours) at 04/03/2017 0807 Last data filed at 04/03/2017 0558 Gross per 24 hour  Intake 150 ml  Output 1350 ml  Net -1200 ml   Filed Weights   04/01/17 0500 04/02/17 0500 04/03/17 0500  Weight: 102 lb 4.7 oz (46.4 kg) 102 lb 4.7 oz (46.4 kg) 102 lb 8.2 oz (46.5 kg)    Telemetry    Atrial fibrillation, HR in 60's to 80's with occasional PVC's.  - Personally Reviewed  ECG    No new tracings.   Physical Exam   General: Elderly Caucasian male appearing in no acute distress. Head: Normocephalic, atraumatic.  Neck: Supple without bruits, JVD not elevated. Lungs:  Resp regular and unlabored, rales along bases  bilaterally. Heart: Irregularly irregular, S1, S2, no S3, S4, no rub. 3/6 SEM along RUSB.  Abdomen: Soft, non-tender, non-distended with normoactive bowel sounds. No hepatomegaly. No rebound/guarding. No obvious abdominal masses. Extremities: No clubbing, cyanosis, or edema. Distal pedal pulses are 2+ bilaterally. Neuro: Alert and oriented X 3. Moves all extremities spontaneously. Psych: Normal affect.  Labs    Chemistry Recent Labs  Lab 03/31/17 1558 04/01/17 0247 04/02/17 0406 04/03/17 0444  NA 139 140 139 139  K 4.4 3.3* 4.0 3.3*  CL 102 101 97* 96*  CO2 24 27 30 29   GLUCOSE 110* 82 93 90  BUN 44* 40* 42* 41*  CREATININE 1.72* 1.59* 1.67* 1.59*  CALCIUM 9.1 8.5* 8.8* 8.7*  PROT 7.4 6.8  --   --   ALBUMIN 3.7 3.2*  --   --   AST 61* 48*  --   --   ALT 54 46  --   --   ALKPHOS 64 53  --   --   BILITOT 0.9 0.7  --   --   GFRNONAA 33* 36* 34* 36*  GFRAA 38* 41* 39* 41*  ANIONGAP 13 12 12 14      Hematology Recent Labs  Lab 03/31/17 1558 04/01/17 0248  WBC 8.1 7.0  RBC 4.14*  3.88*  HGB 11.7* 11.0*  HCT 35.9* 33.9*  MCV 86.7 87.4  MCH 28.3 28.4  MCHC 32.6 32.4  RDW 18.1* 17.9*  PLT 255 235    Cardiac Enzymes Recent Labs  Lab 03/31/17 1558 03/31/17 2101 04/01/17 0247 04/01/17 0845  TROPONINI 0.09* 0.10* 0.10* 0.09*   No results for input(s): TROPIPOC in the last 168 hours.   BNP Recent Labs  Lab 03/31/17 1558  BNP 2,093.0*     DDimer No results for input(s): DDIMER in the last 168 hours.   Radiology    Dg Swallowing Func-speech Pathology  Result Date: 04/02/2017 Objective Swallowing Evaluation: Type of Study: MBS-Modified Barium Swallow Study  Patient Details Name: Jeffrey Mccarty MRN: [id number] Date of Birth: 11-15-1921 Today's Date: 04/02/2017 Time: SLP Start Time (ACUTE ONLY): 1330 -SLP Stop Time (ACUTE ONLY): 1415 SLP Time Calculation (min) (ACUTE ONLY): 45 min Past Medical History: Past Medical History: Diagnosis Date . Aortic stenosis   a.  moderate AS by echo in 04/2015 . Atrial fibrillation (HCC)   on Eliquis . Elevated PSA  . Emphysema lung (HCC)  . Hypertension   no longer on medication . Hypothyroid  . Insomnia  . Kidney stone  . Mitral regurgitation 03/25/2017  moderate . Polymyalgia rheumatica (HCC)   family is not aware of this diagnosis . Pressure ulcer  . Reflux  Past Surgical History: Past Surgical History: Procedure Laterality Date . APPENDECTOMY   . CHOLECYSTECTOMY   . THYROIDECTOMY   . TONSILLECTOMY   HPI: 84 year old male with history of A. fib on anticoagulation, chronic kidney disease stage III, chronic combined systolic and diastolic CHF (EF of 40-45%), mild to moderate left ear and moderate MR, chronic indwelling Foley, who presented from home with increasing lower leg edema for 2 weeks duration.  His Lasix dose was increased from 20 mg daily to 40 mg daily by his cardiology.  Patient reported some epigastric discomfort but no fevers, chills, chest pain, abdominal pain, nausea, vomiting, bowel or urinary symptoms.  Subjective: "Okay" Assessment / Plan / Recommendation CHL IP CLINICAL IMPRESSIONS 04/02/2017 Clinical Impression Pt presents with moderate oropharyngeal phase dysphagia characterized by weak lingual manipulation resulting in reduced bolus cohesiveness, anterior posterior transit, piecemeal deglutition, and lingual residuals; premature spillage with min delay in swallow initiation, reduced tongue base retraction, and epiglottic deflection resulting in vallecular residuals which then spill to pyriforms during repeat swallows (epiglottis never fully deflected during the study) and trace, variable penetration of nectars and thins during the swallow and likely trace amounts after the swallow from vallecular residuals. Penetration was in trace amounts and generally observed tracking down the underside of the epiglottis, which only reached the vocal folds x1 and resulted in trace, silent aspiration during sequential straw sips of  nectar. Effortful swallows were attempted and found to be ineffective to mitigate/reduce pharyngeal residuals with purees/mech soft. A liquid wash and several dry swallows were most effective in reducing pharyngeal residue. A cued cough followed by a dry swallow were also found to be effective. The trace amount of aspirated material (either NTL or thin residuals) was not seen tracking down the anterior tracheal wall and it is suspected that Pt removed it. Pt with brief retention of barium tablet near the valleculae and he was noted to spontaneously throw his head back (which family says is baseline) when swallowing it, likely trying to bypass valleculae as Pt with long, curled epiglottis which never fully deflected. Esophageal sweep was completed and initially showed barium retention in distal  esophagus, which appeared to increase in amount following significant eructation. Backflow of barium noted. Pt reports h/o reflux. Pt is at moderate risk for aspiration due to pharyngeal weakness and suspect some degree of esophageal phase dysphagia placing him at risk for post prandial aspiration. Because there was no great difference in nectars and thins, recommend D2/finely chopped and THIN liquids via small sips (cup or straw) with periodic cough and repeat swallows; alternate solids/liquids intermittently and adhere to reflux and general aspiration precautions. OK for po medications whole in puree. This study was reviewed with Pt and his daughter. Pt is very HOH, so will bring written strategies to room. SLP Visit Diagnosis Dysphagia, oropharyngeal phase (R13.12) Attention and concentration deficit following -- Frontal lobe and executive function deficit following -- Impact on safety and function Moderate aspiration risk   CHL IP TREATMENT RECOMMENDATION 04/02/2017 Treatment Recommendations Therapy as outlined in treatment plan below   Prognosis 04/02/2017 Prognosis for Safe Diet Advancement Fair Barriers to Reach Goals  Severity of deficits Barriers/Prognosis Comment work of breathing CHL IP DIET RECOMMENDATION 04/02/2017 SLP Diet Recommendations Dysphagia 2 (Fine chop) solids;Thin liquid Liquid Administration via Cup;Straw Medication Administration Whole meds with puree Compensations Slow rate;Small sips/bites;Multiple dry swallows after each bite/sip;Follow solids with liquid;Clear throat intermittently Postural Changes Remain semi-upright after after feeds/meals (Comment);Seated upright at 90 degrees   CHL IP OTHER RECOMMENDATIONS 04/02/2017 Recommended Consults Consider esophageal assessment Oral Care Recommendations Oral care BID;Staff/trained caregiver to provide oral care Other Recommendations Clarify dietary restrictions   CHL IP FOLLOW UP RECOMMENDATIONS 04/02/2017 Follow up Recommendations Skilled Nursing facility;24 hour supervision/assistance   CHL IP FREQUENCY AND DURATION 04/02/2017 Speech Therapy Frequency (ACUTE ONLY) min 2x/week Treatment Duration 1 week      CHL IP ORAL PHASE 04/02/2017 Oral Phase Impaired Oral - Pudding Teaspoon -- Oral - Pudding Cup -- Oral - Honey Teaspoon -- Oral - Honey Cup -- Oral - Nectar Teaspoon Lingual/palatal residue;Decreased bolus cohesion;Premature spillage Oral - Nectar Cup -- Oral - Nectar Straw -- Oral - Thin Teaspoon -- Oral - Thin Cup -- Oral - Thin Straw -- Oral - Puree Decreased bolus cohesion;Premature spillage;Piecemeal swallowing;Lingual/palatal residue Oral - Mech Soft Weak lingual manipulation;Impaired mastication;Lingual/palatal residue;Decreased bolus cohesion Oral - Regular -- Oral - Multi-Consistency -- Oral - Pill Reduced posterior propulsion Oral Phase - Comment --  CHL IP PHARYNGEAL PHASE 04/02/2017 Pharyngeal Phase Impaired Pharyngeal- Pudding Teaspoon -- Pharyngeal -- Pharyngeal- Pudding Cup -- Pharyngeal -- Pharyngeal- Honey Teaspoon -- Pharyngeal -- Pharyngeal- Honey Cup -- Pharyngeal -- Pharyngeal- Nectar Teaspoon Delayed swallow initiation-vallecula;Reduced epiglottic  inversion;Reduced tongue base retraction;Penetration/Aspiration before swallow;Pharyngeal residue - valleculae;Pharyngeal residue - pyriform;Pharyngeal residue - posterior pharnyx;Lateral channel residue Pharyngeal Material does not enter airway;Material enters airway, remains ABOVE vocal cords then ejected out Pharyngeal- Nectar Cup -- Pharyngeal -- Pharyngeal- Nectar Straw Delayed swallow initiation-pyriform sinuses;Reduced epiglottic inversion;Reduced airway/laryngeal closure;Reduced tongue base retraction;Penetration/Aspiration during swallow;Trace aspiration;Pharyngeal residue - valleculae;Pharyngeal residue - pyriform;Lateral channel residue Pharyngeal Material enters airway, remains ABOVE vocal cords then ejected out;Material enters airway, passes BELOW cords without attempt by patient to eject out (silent aspiration) Pharyngeal- Thin Teaspoon Delayed swallow initiation-pyriform sinuses;Penetration/Aspiration during swallow;Reduced epiglottic inversion;Reduced airway/laryngeal closure;Reduced tongue base retraction;Pharyngeal residue - valleculae;Pharyngeal residue - pyriform Pharyngeal Material does not enter airway;Material enters airway, remains ABOVE vocal cords then ejected out;Material enters airway, remains ABOVE vocal cords and not ejected out Pharyngeal- Thin Cup Delayed swallow initiation-pyriform sinuses;Penetration/Aspiration during swallow;Reduced epiglottic inversion;Reduced airway/laryngeal closure;Reduced tongue base retraction;Pharyngeal residue - valleculae;Pharyngeal residue - pyriform Pharyngeal Material does  not enter airway;Material enters airway, remains ABOVE vocal cords then ejected out Pharyngeal- Thin Straw Delayed swallow initiation-pyriform sinuses;Reduced epiglottic inversion;Reduced airway/laryngeal closure;Reduced tongue base retraction;Penetration/Aspiration during swallow;Pharyngeal residue - valleculae;Pharyngeal residue - pyriform Pharyngeal Material does not enter  airway;Material enters airway, remains ABOVE vocal cords then ejected out;Material enters airway, remains ABOVE vocal cords and not ejected out Pharyngeal- Puree Delayed swallow initiation-vallecula;Reduced epiglottic inversion;Reduced tongue base retraction;Pharyngeal residue - valleculae Pharyngeal -- Pharyngeal- Mechanical Soft Delayed swallow initiation-vallecula;Reduced epiglottic inversion;Reduced tongue base retraction;Pharyngeal residue - valleculae Pharyngeal -- Pharyngeal- Regular -- Pharyngeal -- Pharyngeal- Multi-consistency -- Pharyngeal -- Pharyngeal- Pill Pharyngeal residue - valleculae;Reduced epiglottic inversion;Reduced tongue base retraction Pharyngeal -- Pharyngeal Comment reduced tongue base retraction and epiglottic deflection  CHL IP CERVICAL ESOPHAGEAL PHASE 04/02/2017 Cervical Esophageal Phase Impaired Pudding Teaspoon -- Pudding Cup -- Honey Teaspoon -- Honey Cup -- Nectar Teaspoon -- Nectar Cup -- Nectar Straw Esophageal backflow into the pharynx Thin Teaspoon -- Thin Cup -- Thin Straw -- Puree -- Mechanical Soft -- Regular -- Multi-consistency -- Pill -- Cervical Esophageal Comment Esophageal sweep revealed some backflow of contrast in distal esophagus No flowsheet data found. Thank you, Havery MorosDabney Jeffrey Mccarty, CCC-SLP [phone] Jeffrey Mccarty,Jeffrey Mccarty 04/02/2017, 6:51 PM              [place] Abdomen Limited Ruq  Result Date: 04/01/2017 CLINICAL DATA:  Abnormal LFTs EXAM: ULTRASOUND ABDOMEN LIMITED RIGHT UPPER QUADRANT COMPARISON:  CT abdomen and pelvis 12/31/2016 FINDINGS: Gallbladder: Surgically absent Common bile duct: Diameter: 3 mm diameter, normal Liver: Mildly heterogeneous echogenicity, generally isoechoic to RIGHT kidney. No focal hepatic mass or nodularity. Portal vein is patent on color Doppler imaging with normal direction of blood flow towards the liver. No RIGHT upper quadrant free fluid. IMPRESSION: Post cholecystectomy. No focal hepatobiliary sonographic abnormalities identified. Electronically  Signed   By: Ulyses SouthwardMark  Boles M.D.   On: 04/01/2017 08:39    Cardiac Studies   Echocardiogram: 03/25/2017 Study Conclusions  - Left ventricle: The cavity size was normal. Wall thickness was   increased in a pattern of mild LVH. Systolic function was mildly   to moderately reduced. The estimated ejection fraction was in the   range of 40% to 45%. Diffuse hypokinesis. The study was not   technically sufficient to allow evaluation of LV diastolic   dysfunction due to atrial fibrillation. Doppler parameters are   consistent with high ventricular filling pressure. - Aortic valve: Moderately to severely calcified annulus.   Trileaflet; moderately calcified leaflets. There was mild to   moderate stenosis. Peak velocity (S): 279 cm/s. Mean gradient   (S): 18 mm Hg. Valve area (VTI): 1.09 cm^2. Valve area (Vmax):   1.14 cm^2. Valve area (Vmean): 1.15 cm^2. - Mitral valve: Mildly thickened leaflets . There was moderate   regurgitation. - Left atrium: The atrium was severely dilated. - Right ventricle: Systolic function was moderately reduced. - Right atrium: The atrium was moderately dilated. - Tricuspid valve: There was moderate regurgitation. There were two   distinct jets noted. - Pulmonary arteries: PA peak pressure: 38 mm Hg (S).  Patient Profile     84 y.o. male w/ PMH of chronic combined systolic and diastolic CHF (EF 09-81%40-45% by echo in 03/2017), moderate AS, HTN, and Stage 3 CKD who presented to    Assessment & Plan    1. Acute on Chronic Combined Systolic and Diastolic CHF - presented with worsening dyspnea and found to have an elevated BNP of 2093 and CXR consistent with CHF.    - has  been diuresing well with IV Lasix and is overall -5.0 L since admission. Weight is down from 109 lbs to 102 lbs. Kidney function has improved, at 1.59 today. He still has rales on examination, therefore would continue with IV Lasix 40mg  BID. Consider transition back to PO Lasix tomorrow.   2. Persistent  Atrial Fibrillation - HR remains well-controlled in the 60's to 80's. Remains on Digoxin 0.125 mg every other day.  - continue Eliquis 2.5mg  BID for anticoagulation.   3. Aortic Stenosis - mild to moderate by recent echo with mean gradient of 18 mm Hg. Valve area (Vmax): 1.14 cm^2.  - the patient and his family prefer conservative options and there are no plans for further intervention.   4. Elevated Troponin - values have been flat at 0.09, 0.10, 0.10, and 0.09. Likely secondary to demand ischemia in the setting of his CHF exacerbation.  - no plans for further ischemic evaluation at this time in the setting of his advanced age.    For questions or updates, please contact [facility] Please consult [email or web address] for contact info under Cardiology/STEMI.   Lorri Frederick , PA-C 8:07 AM 04/03/2017 Pager: [phone] Patient seen and discussed with PA [place] Ouch, I agree with her documentation above. Admitted with acute on chronic combined systolic/diastolic HF. Negative yesterday, negative 5 liters since admission. He received lasix 40mg  IV x 1 yesterday. Will dose bid today. Downtrend in Cr with diuresis consistent with venous congestion and CHF. Medical therapy for CHF limited due to soft bp's in the past, he has previously not tolerated beta blocker. No ACE/ARB/ARNI due to poor renal function. He and family have not been interested in invasive procedures in setting of drop in LVEF. Continue IV diuresis. His afib management is unconventional with digoxin every other day but he has done well on this regimen for some time and thus I have continued, continue eliquis for stroke prevention.     Dina Rich MD

## 2017-04-04 DIAGNOSIS — I5023 Acute on chronic systolic (congestive) heart failure: Secondary | ICD-10-CM

## 2017-04-04 LAB — BASIC METABOLIC PANEL
Anion gap: 12 (ref 5–15)
BUN: 55 mg/dL — AB (ref 6–20)
CHLORIDE: 97 mmol/L — AB (ref 101–111)
CO2: 31 mmol/L (ref 22–32)
CREATININE: 1.53 mg/dL — AB (ref 0.61–1.24)
Calcium: 8.9 mg/dL (ref 8.9–10.3)
GFR calc Af Amer: 43 mL/min — ABNORMAL LOW (ref >60)
GFR calc non Af Amer: 37 mL/min — ABNORMAL LOW (ref >60)
GLUCOSE: 98 mg/dL (ref 65–99)
Potassium: 3.3 mmol/L — ABNORMAL LOW (ref 3.5–5.1)
SODIUM: 140 mmol/L (ref 135–145)

## 2017-04-04 MED ORDER — POTASSIUM CHLORIDE CRYS ER 20 MEQ PO TBCR
40.0000 meq | EXTENDED_RELEASE_TABLET | Freq: Once | ORAL | Status: AC
  Administered 2017-04-04: 40 meq via ORAL
  Filled 2017-04-04: qty 2

## 2017-04-04 MED ORDER — FUROSEMIDE 20 MG PO TABS: ORAL_TABLET | ORAL | 1 refills | Status: DC

## 2017-04-04 MED ORDER — FUROSEMIDE 40 MG PO TABS
40.0000 mg | ORAL_TABLET | ORAL | Status: DC
  Administered 2017-04-04: 40 mg via ORAL
  Filled 2017-04-04: qty 1

## 2017-04-04 MED ORDER — FUROSEMIDE 40 MG PO TABS: 60.0000 mg | ORAL_TABLET | ORAL | Status: DC

## 2017-04-04 NOTE — Progress Notes (Signed)
Pt's daughter said pt vomited this afternoon and he may have aspirated. His breathing pattern changed. It was more labored. I notified Dr.

## 2017-04-04 NOTE — Discharge Summary (Signed)
Physician Discharge Summary  Jeffrey Mccarty ZDG:[id number] DOB: [birth year] DOA: 03/31/2017  PCP: Jeffrey Albert, MD  Admit date: 03/31/2017 Discharge date: 04/04/2017  Time spent: 35 minutes  Recommendations for Outpatient Follow-up:  1. Repeat basic metabolic panel and magnesium level to follow electrolytes and renal function 2. Repeat LFTs to follow-up transaminitis 3. Reassess blood pressure (chronically soft to low)   Discharge Diagnoses:  Principal Problem:   CHF (congestive heart failure) (HCC) Active Problems:   Atrial fibrillation, chronic (HCC)   Elevated troponin   Renal insufficiency   Pressure injury of skin Dysphagia   Discharge Condition: Stable and improved.  Patient had been discharged home with home health services.  Follow-up with PCP in 2 weeks and will have follow-up with cardiologist in 1 week.  Diet recommendation: Heart healthy/low-sodium diet.  Dysphagia 1 with thin liquids.  Filed Weights   04/01/17 0500 04/02/17 0500 04/03/17 0500  Weight: 46.4 kg (102 lb 4.7 oz) 46.4 kg (102 lb 4.7 oz) 46.5 kg (102 lb 8.2 oz)    Brief History of present illness:  84 year old male with history of A. fib on anticoagulation, chronic kidney disease stage III, chronic combined systolic and diastolic CHF (EF of 40-45%), mild to moderate left ear and moderate MR, chronic indwelling Foley, who presented from home with increasing lower leg edema for 2 weeks duration.  His Lasix dose was increased from 20 mg daily to 40 mg daily by his cardiology.  Patient reported some epigastric discomfort but no fevers, chills, chest pain, abdominal pain, nausea, vomiting, bowel or urinary symptoms. In the ED vitals were stable with blood work showing BUN of 44, creatinine 1.72, mild transaminitis, hemoglobin of 11.7, troponin of 0.09, BNP of 2000, UA positive for UTI and EKG showing A. fib with ST depression in lateral leads.  Chest x-ray showed findings of CHF.  Hospital Course:  Acute on  chronic combined systolic and diastolic CHF (HCC) -Good diuresis with the use of IV Lasix (-6.5 L since admission).   -over 12 pounds off -Euvolemic at discharge -follow cardiology rec's to transition diuretic regimen to oral route and with decision for outpatient therapy; patient will take Lasix 60 mg by mouth daily on Monday-Wednesday-Friday and will take 40 mg by mouth daily on Tuesday-Thursday-Saturday and Sunday. -continue daily weight and low sodium diet   Atrial fibrillation, chronic (HCC) -Rate controlled.   -continue digoxin every other day -continue eliquis   elevated troponin.   -Suspect demand ischemia.   -Continue aspirin and statin.   -Echo done last week so does not need to be repeated.  Hypotension -Chronic.   -Blood pressure stable overnight, even soft.  Chronic kidney disease stage III: with hypokalemia and hypomagnesemia  -Renal function at baseline. -follow trend (especially with adjusted diuretics regimen) -follow electrolytes and replete them as needed -patient on chornic potassium supplementation   Presumed UTI on admission  -At high risk for recurrent infections due to chronic foley catheter  -empirically on Rocephin initially; then discontinued as UTI was rule out   Transaminitis -Mild.   -Ultrasound without acute findings. -follow LFT's as an outpatient  -most likely associated with passive live congestion from CHF.  Dysphagia -continue rec's by speech therapy  -family reporting slight improvement in intake and not associated coughing spells. -will discharge on dysphagia 1 diet and thin liquids -home health speech therapy arranged at discharge  Hypothyroidism -Continue Synthroid. -repeat TSH as an outpatient   Pressure injury of skin -stage 1 present prior to admission  -  continue preventive measures    urinary retention with chronic indwelling foley -S/p foley exchange on 3/5 -no complaints and good urine output  appreciated  Procedures:  See below for x-ray reports   Consultations:  Cardiology   Discharge Exam: Vitals:   04/04/17 1359 04/04/17 1400  BP: (!) 120/55 (!) 120/55  Pulse: 80 80  Resp: 18   Temp: 98.3 F (36.8 C) 98.3 F (36.8 C)  SpO2: 90% 95%   General: elderly man, hard of hearing; in no major distress. Denying CP and resting comfortable. Still using @L  [place] supplementation, but per family reports breathing easier.   Chest: no frank crackles heard; decrease BS at bases, no wheezing CVS: rate controlled, positive S3, positive SEM, no rubs. GI: soft, NT, ND, positive BS GUN: chronic Foley in place  Musculoskeletal: no edema, no cyanosis  Skin: stage 1 sacrum pressure injury, present PTOA; no surrounding erythema or breakdown.   Discharge Instructions   Discharge Instructions    (HEART FAILURE PATIENTS) Call MD:  Anytime you have any of the following symptoms: 1) 3 pound weight gain in 24 hours or 5 pounds in 1 week 2) shortness of breath, with or without a dry hacking cough 3) swelling in the hands, feet or stomach 4) if you have to sleep on extra pillows at night in order to breathe.   Complete by:  As directed    Diet - low sodium heart healthy   Complete by:  As directed    Discharge instructions   Complete by:  As directed    Take medications as prescribed Follow dysphagia 1 diet with thin liquids Arrange follow-up with PCP in 10 days Please follow-up with cardiology service as instructed (office will contact you with appointment details) Check your weight on daily basis and follow a low-sodium diet (less than 2 gram of sodium daily)     Allergies as of 04/04/2017      Reactions   Cardura [doxazosin Mesylate]       Medication List    TAKE these medications   DIGOX 0.125 MG tablet Generic drug:  digoxin take 1 tablet by mouth every other day   ELIQUIS 2.5 MG Tabs tablet Generic drug:  apixaban take 1 tablet by mouth twice a day   feeding supplement  (ENSURE COMPLETE) Liqd Take 237 mLs by mouth 2 (two) times daily between meals.   furosemide 20 MG tablet Commonly known as:  LASIX Take 60 mg by mouth daily on Monday-Wednesday-Friday; and 40 mg daily on Tuesday-Thursday-Saturday and Sunday What changed:    how much to take  how to take this  when to take this  additional instructions   levothyroxine 75 MCG tablet Commonly known as:  SYNTHROID, LEVOTHROID Take 1 tablet (75 mcg total) by mouth daily before breakfast.   loratadine 10 MG tablet Commonly known as:  CLARITIN Take 10 mg by mouth daily.   Melatonin 1 MG Tabs Take 1 mg by mouth at bedtime as needed (sleep).   pantoprazole 40 MG tablet Commonly known as:  PROTONIX Take 1 tablet (40 mg total) by mouth daily.   potassium chloride SA 20 MEQ tablet Commonly known as:  K-DUR,KLOR-CON Take 20 mEq by mouth daily.   psyllium 95 % Pack Commonly known as:  HYDROCIL/METAMUCIL Take 1 packet by mouth daily as needed for mild constipation.   triamcinolone ointment 0.1 % Commonly known as:  KENALOG apply to affected area twice a day   TYLENOL 325 MG Caps Generic drug:  Acetaminophen Take 1 capsule by mouth daily as needed.            Durable Medical Equipment  (From admission, onward)        Start     Ordered   04/04/17 1449  For home use only DME oxygen  Once    Question Answer Comment  Mode or (Route) Nasal cannula   Liters per Minute 2   Frequency Continuous (stationary and portable oxygen unit needed)   Oxygen conserving device Yes   Oxygen delivery system Gas      04/04/17 1451   04/04/17 0953  For home use only DME Other see comment  Once    Comments:  transport chair (4 small wheels), elevated leg rests if possible Ht: 5'1" Wt: 105lbs   04/04/17 0953     Allergies  Allergen Reactions  . Cardura [Doxazosin Mesylate]    Follow-up Information    Ellsworth Lennox, PA-C. Go on 04/16/2017.   Specialties:  Physician Assistant,  Cardiology Why:  at 2:30PM.  Contact information: [address] [place] [address] [phone]        Jeffrey Albert, MD. Schedule an appointment as soon as possible for a visit in 2 week(s).   Specialty:  Family Medicine Contact information: [address] [place] [address] [phone]        Antoine Poche, MD .   Specialty:  Cardiology Contact information: [address] [place] [address] [phone]           The results of significant diagnostics from this hospitalization (including imaging, microbiology, ancillary and laboratory) are listed below for reference.    Significant Diagnostic Studies: Dg Chest 2 View  Result Date: 03/20/2017 CLINICAL DATA:  84 year old male with shortness of breath and wheezing. EXAM: CHEST  2 VIEW COMPARISON:  12/31/2016 and earlier. FINDINGS: Chronic cardiomegaly appears stable since 2015. Other mediastinal contours are stable. Chronic pulmonary hyperinflation with increased AP dimension to the chest. Chronic but increased diffuse bilateral pulmonary interstitial opacity. No pleural effusion or pleural fluid is visible. No pneumothorax or confluent pulmonary opacity. Visualized tracheal air column is within normal limits. Osteopenia. Chronic lower thoracic or upper lumbar compression fracture is stable since 2015. No acute osseous abnormality identified. Stable cholecystectomy clips. Negative visible bowel gas pattern. IMPRESSION: 1. Chronic but increased bilateral pulmonary interstitial opacity. Top differential considerations are viral/atypical respiratory infection (mildly favored in light of no pleural fluid), and acute pulmonary edema. 2. Chronic cardiomegaly and pulmonary hyperinflation. Electronically Signed   By: Odessa Fleming M.D.   On: 03/20/2017 14:58   Dg Chest Portable 1 View  Result Date: 03/31/2017 CLINICAL DATA:  84 year old male with history of shortness of breath, fever and headache. Runny nose since  yesterday. EXAM: PORTABLE CHEST 1 VIEW COMPARISON:  Chest x-ray 03/20/2017. FINDINGS: There is cephalization of the pulmonary vasculature, indistinctness of the interstitial markings, and patchy airspace disease throughout the lungs bilaterally suggestive of moderate pulmonary edema. Small to moderate bilateral pleural effusions. Mild cardiomegaly. The patient is rotated to the left on today's exam, resulting in distortion of the mediastinal contours and reduced diagnostic sensitivity and specificity for mediastinal pathology. Atherosclerosis in the thoracic aorta. IMPRESSION: 1. The appearance the chest suggests moderate congestive heart failure, as above. 2. Aortic atherosclerosis. Electronically Signed   By: Trudie Reed M.D.   On: 03/31/2017 16:13   Dg Swallowing Func-speech Pathology  Result Date: 04/02/2017 Objective Swallowing Evaluation: Type of Study: MBS-Modified Barium  Swallow Study  Patient Details Name: CRESTON KLAS MRN: [id number] Date of Birth: 27-Jul-1921 Today's Date: 04/02/2017 Time: SLP Start Time (ACUTE ONLY): 1330 -SLP Stop Time (ACUTE ONLY): 1415 SLP Time Calculation (min) (ACUTE ONLY): 45 min Past Medical History: Past Medical History: Diagnosis Date . Aortic stenosis   a. moderate AS by echo in 04/2015 . Atrial fibrillation (HCC)   on Eliquis . Elevated PSA  . Emphysema lung (HCC)  . Hypertension   no longer on medication . Hypothyroid  . Insomnia  . Kidney stone  . Mitral regurgitation 03/25/2017  moderate . Polymyalgia rheumatica (HCC)   family is not aware of this diagnosis . Pressure ulcer  . Reflux  Past Surgical History: Past Surgical History: Procedure Laterality Date . APPENDECTOMY   . CHOLECYSTECTOMY   . THYROIDECTOMY   . TONSILLECTOMY   HPI: 84 year old male with history of A. fib on anticoagulation, chronic kidney disease stage III, chronic combined systolic and diastolic CHF (EF of 40-45%), mild to moderate left ear and moderate MR, chronic indwelling Foley, who presented from  home with increasing lower leg edema for 2 weeks duration.  His Lasix dose was increased from 20 mg daily to 40 mg daily by his cardiology.  Patient reported some epigastric discomfort but no fevers, chills, chest pain, abdominal pain, nausea, vomiting, bowel or urinary symptoms.  Subjective: "Okay" Assessment / Plan / Recommendation CHL IP CLINICAL IMPRESSIONS 04/02/2017 Clinical Impression Pt presents with moderate oropharyngeal phase dysphagia characterized by weak lingual manipulation resulting in reduced bolus cohesiveness, anterior posterior transit, piecemeal deglutition, and lingual residuals; premature spillage with min delay in swallow initiation, reduced tongue base retraction, and epiglottic deflection resulting in vallecular residuals which then spill to pyriforms during repeat swallows (epiglottis never fully deflected during the study) and trace, variable penetration of nectars and thins during the swallow and likely trace amounts after the swallow from vallecular residuals. Penetration was in trace amounts and generally observed tracking down the underside of the epiglottis, which only reached the vocal folds x1 and resulted in trace, silent aspiration during sequential straw sips of nectar. Effortful swallows were attempted and found to be ineffective to mitigate/reduce pharyngeal residuals with purees/mech soft. A liquid wash and several dry swallows were most effective in reducing pharyngeal residue. A cued cough followed by a dry swallow were also found to be effective. The trace amount of aspirated material (either NTL or thin residuals) was not seen tracking down the anterior tracheal wall and it is suspected that Pt removed it. Pt with brief retention of barium tablet near the valleculae and he was noted to spontaneously throw his head back (which family says is baseline) when swallowing it, likely trying to bypass valleculae as Pt with long, curled epiglottis which never fully deflected.  Esophageal sweep was completed and initially showed barium retention in distal esophagus, which appeared to increase in amount following significant eructation. Backflow of barium noted. Pt reports h/o reflux. Pt is at moderate risk for aspiration due to pharyngeal weakness and suspect some degree of esophageal phase dysphagia placing him at risk for post prandial aspiration. Because there was no great difference in nectars and thins, recommend D2/finely chopped and THIN liquids via small sips (cup or straw) with periodic cough and repeat swallows; alternate solids/liquids intermittently and adhere to reflux and general aspiration precautions. OK for po medications whole in puree. This study was reviewed with Pt and his daughter. Pt is very HOH, so will bring written strategies to room. SLP Visit  Diagnosis Dysphagia, oropharyngeal phase (R13.12) Attention and concentration deficit following -- Frontal lobe and executive function deficit following -- Impact on safety and function Moderate aspiration risk   CHL IP TREATMENT RECOMMENDATION 04/02/2017 Treatment Recommendations Therapy as outlined in treatment plan below   Prognosis 04/02/2017 Prognosis for Safe Diet Advancement Fair Barriers to Reach Goals Severity of deficits Barriers/Prognosis Comment work of breathing CHL IP DIET RECOMMENDATION 04/02/2017 SLP Diet Recommendations Dysphagia 2 (Fine chop) solids;Thin liquid Liquid Administration via Cup;Straw Medication Administration Whole meds with puree Compensations Slow rate;Small sips/bites;Multiple dry swallows after each bite/sip;Follow solids with liquid;Clear throat intermittently Postural Changes Remain semi-upright after after feeds/meals (Comment);Seated upright at 90 degrees   CHL IP OTHER RECOMMENDATIONS 04/02/2017 Recommended Consults Consider esophageal assessment Oral Care Recommendations Oral care BID;Staff/trained caregiver to provide oral care Other Recommendations Clarify dietary restrictions   CHL IP  FOLLOW UP RECOMMENDATIONS 04/02/2017 Follow up Recommendations Skilled Nursing facility;24 hour supervision/assistance   CHL IP FREQUENCY AND DURATION 04/02/2017 Speech Therapy Frequency (ACUTE ONLY) min 2x/week Treatment Duration 1 week      CHL IP ORAL PHASE 04/02/2017 Oral Phase Impaired Oral - Pudding Teaspoon -- Oral - Pudding Cup -- Oral - Honey Teaspoon -- Oral - Honey Cup -- Oral - Nectar Teaspoon Lingual/palatal residue;Decreased bolus cohesion;Premature spillage Oral - Nectar Cup -- Oral - Nectar Straw -- Oral - Thin Teaspoon -- Oral - Thin Cup -- Oral - Thin Straw -- Oral - Puree Decreased bolus cohesion;Premature spillage;Piecemeal swallowing;Lingual/palatal residue Oral - Mech Soft Weak lingual manipulation;Impaired mastication;Lingual/palatal residue;Decreased bolus cohesion Oral - Regular -- Oral - Multi-Consistency -- Oral - Pill Reduced posterior propulsion Oral Phase - Comment --  CHL IP PHARYNGEAL PHASE 04/02/2017 Pharyngeal Phase Impaired Pharyngeal- Pudding Teaspoon -- Pharyngeal -- Pharyngeal- Pudding Cup -- Pharyngeal -- Pharyngeal- Honey Teaspoon -- Pharyngeal -- Pharyngeal- Honey Cup -- Pharyngeal -- Pharyngeal- Nectar Teaspoon Delayed swallow initiation-vallecula;Reduced epiglottic inversion;Reduced tongue base retraction;Penetration/Aspiration before swallow;Pharyngeal residue - valleculae;Pharyngeal residue - pyriform;Pharyngeal residue - posterior pharnyx;Lateral channel residue Pharyngeal Material does not enter airway;Material enters airway, remains ABOVE vocal cords then ejected out Pharyngeal- Nectar Cup -- Pharyngeal -- Pharyngeal- Nectar Straw Delayed swallow initiation-pyriform sinuses;Reduced epiglottic inversion;Reduced airway/laryngeal closure;Reduced tongue base retraction;Penetration/Aspiration during swallow;Trace aspiration;Pharyngeal residue - valleculae;Pharyngeal residue - pyriform;Lateral channel residue Pharyngeal Material enters airway, remains ABOVE vocal cords then ejected  out;Material enters airway, passes BELOW cords without attempt by patient to eject out (silent aspiration) Pharyngeal- Thin Teaspoon Delayed swallow initiation-pyriform sinuses;Penetration/Aspiration during swallow;Reduced epiglottic inversion;Reduced airway/laryngeal closure;Reduced tongue base retraction;Pharyngeal residue - valleculae;Pharyngeal residue - pyriform Pharyngeal Material does not enter airway;Material enters airway, remains ABOVE vocal cords then ejected out;Material enters airway, remains ABOVE vocal cords and not ejected out Pharyngeal- Thin Cup Delayed swallow initiation-pyriform sinuses;Penetration/Aspiration during swallow;Reduced epiglottic inversion;Reduced airway/laryngeal closure;Reduced tongue base retraction;Pharyngeal residue - valleculae;Pharyngeal residue - pyriform Pharyngeal Material does not enter airway;Material enters airway, remains ABOVE vocal cords then ejected out Pharyngeal- Thin Straw Delayed swallow initiation-pyriform sinuses;Reduced epiglottic inversion;Reduced airway/laryngeal closure;Reduced tongue base retraction;Penetration/Aspiration during swallow;Pharyngeal residue - valleculae;Pharyngeal residue - pyriform Pharyngeal Material does not enter airway;Material enters airway, remains ABOVE vocal cords then ejected out;Material enters airway, remains ABOVE vocal cords and not ejected out Pharyngeal- Puree Delayed swallow initiation-vallecula;Reduced epiglottic inversion;Reduced tongue base retraction;Pharyngeal residue - valleculae Pharyngeal -- Pharyngeal- Mechanical Soft Delayed swallow initiation-vallecula;Reduced epiglottic inversion;Reduced tongue base retraction;Pharyngeal residue - valleculae Pharyngeal -- Pharyngeal- Regular -- Pharyngeal -- Pharyngeal- Multi-consistency -- Pharyngeal -- Pharyngeal- Pill Pharyngeal residue - valleculae;Reduced epiglottic inversion;Reduced tongue base retraction Pharyngeal --  Pharyngeal Comment reduced tongue base retraction and  epiglottic deflection  CHL IP CERVICAL ESOPHAGEAL PHASE 04/02/2017 Cervical Esophageal Phase Impaired Pudding Teaspoon -- Pudding Cup -- Honey Teaspoon -- Honey Cup -- Nectar Teaspoon -- Nectar Cup -- Nectar Straw Esophageal backflow into the pharynx Thin Teaspoon -- Thin Cup -- Thin Straw -- Puree -- Mechanical Soft -- Regular -- Multi-consistency -- Pill -- Cervical Esophageal Comment Esophageal sweep revealed some backflow of contrast in distal esophagus No flowsheet data found. Thank you, Havery Moros, CCC-SLP [phone] PORTER,DABNEY 04/02/2017, 6:51 PM              US Abdomen Limited Ruq  Result Date: 04/01/2017 CLINICAL DATA:  Abnormal LFTs EXAM: ULTRASOUND ABDOMEN LIMITED RIGHT UPPER QUADRANT COMPARISON:  CT abdomen and pelvis 12/31/2016 FINDINGS: Gallbladder: Surgically absent Common bile duct: Diameter: 3 mm diameter, normal Liver: Mildly heterogeneous echogenicity, generally isoechoic to RIGHT kidney. No focal hepatic mass or nodularity. Portal vein is patent on color Doppler imaging with normal direction of blood flow towards the liver. No RIGHT upper quadrant free fluid. IMPRESSION: Post cholecystectomy. No focal hepatobiliary sonographic abnormalities identified. Electronically Signed   By: Ulyses Southward M.D.   On: 04/01/2017 08:39    Microbiology: Recent Results (from the past 240 hour(s))  MRSA PCR Screening     Status: None   Collection Time: 03/31/17  8:00 PM  Result Value Ref Range Status   MRSA by PCR NEGATIVE NEGATIVE Final    Comment:        The GeneXpert MRSA Assay (FDA approved for NASAL specimens only), is one component of a comprehensive MRSA colonization surveillance program. It is not intended to diagnose MRSA infection nor to guide or monitor treatment for MRSA infections. Performed at [facility], [address] [place], [place] [address]     Labs: Basic Metabolic Panel: Recent Labs  Lab 03/31/17 1558 04/01/17 0247 04/02/17 0406 04/03/17 0444 04/04/17 0603   NA 139 140 139 139 140  K 4.4 3.3* 4.0 3.3* 3.3*  CL 102 101 97* 96* 97*  CO2 24 27 30 29 31   GLUCOSE 110* 82 93 90 98  BUN 44* 40* 42* 41* 55*  CREATININE 1.72* 1.59* 1.67* 1.59* 1.53*  CALCIUM 9.1 8.5* 8.8* 8.7* 8.9  MG  --   --  2.0  --   --    Liver Function Tests: Recent Labs  Lab 03/31/17 1558 04/01/17 0247  AST 61* 48*  ALT 54 46  ALKPHOS 64 53  BILITOT 0.9 0.7  PROT 7.4 6.8  ALBUMIN 3.7 3.2*   Recent Labs  Lab 03/31/17 1558  LIPASE 34   CBC: Recent Labs  Lab 03/31/17 1558 04/01/17 0248  WBC 8.1 7.0  NEUTROABS 6.2  --   HGB 11.7* 11.0*  HCT 35.9* 33.9*  MCV 86.7 87.4  PLT 255 235   Cardiac Enzymes: Recent Labs  Lab 03/31/17 1558 03/31/17 2101 04/01/17 0247 04/01/17 0845  TROPONINI 0.09* 0.10* 0.10* 0.09*   BNP: BNP (last 3 results) Recent Labs    03/20/17 1432 03/25/17 1137 03/31/17 1558  BNP 1,680.0* 1,144.0* 2,093.0*    Signed:  Vassie Loll MD.  [facility] 04/04/2017, 2:59 PM

## 2017-04-04 NOTE — Progress Notes (Addendum)
Progress Note  Patient Name: Jeffrey Mccarty Date of Encounter: 04/04/2017  Primary Cardiologist: Dina Rich, MD   Subjective   Lying flat in bed. Denies any current symptoms. No family present at the time of this encounter.   Inpatient Medications    Scheduled Meds: . apixaban  2.5 mg Oral BID  . digoxin  125 mcg Oral QODAY  . feeding supplement (ENSURE ENLIVE)  237 mL Oral BID BM  . levothyroxine  75 mcg Oral QAC breakfast  . loratadine  10 mg Oral Daily  . mouth rinse  15 mL Mouth Rinse BID  . pantoprazole  40 mg Oral Daily  . potassium chloride SA  30 mEq Oral Daily  . sodium chloride flush  3 mL Intravenous Q12H   Continuous Infusions: . sodium chloride 250 mL (03/31/17 2126)   PRN Meds: sodium chloride, acetaminophen **OR** acetaminophen, methocarbamol, psyllium, sodium chloride flush   Vital Signs    Vitals:   04/03/17 1534 04/03/17 2007 04/03/17 2217 04/04/17 0540  BP:   126/82 108/86  Pulse: (!) 110   62  Resp:   16 14  Temp:   99.1 F (37.3 C) 98.7 F (37.1 C)  TempSrc:   Oral Oral  SpO2: 100% 95% 98% 98%  Weight:      Height:        Intake/Output Summary (Last 24 hours) at 04/04/2017 0805 Last data filed at 04/04/2017 0200 Gross per 24 hour  Intake 720 ml  Output 2450 ml  Net -1730 ml   Filed Weights   04/01/17 0500 04/02/17 0500 04/03/17 0500  Weight: 102 lb 4.7 oz (46.4 kg) 102 lb 4.7 oz (46.4 kg) 102 lb 8.2 oz (46.5 kg)    Telemetry    Atrial fibrillation, HR in 70's to 80's with occasional PVC's. - Personally Reviewed  ECG    No new tracings.   Physical Exam   General: Well developed, elderly Caucasian male appearing in no acute distress. Head: Normocephalic, atraumatic.  Neck: Supple without bruits, JVD not elevated. Lungs:  Resp regular and unlabored, mild rales along left base. Heart: Irregularly irregular, S1, S2, no S3, S4, 3/6 SEM present at RUSB Abdomen: Soft, non-tender, non-distended with normoactive bowel sounds. No  hepatomegaly. No rebound/guarding. No obvious abdominal masses. Extremities: No clubbing, cyanosis, or lower extremity edema. Distal pedal pulses are 2+ bilaterally. Neuro: Alert and oriented X 3. Moves all extremities spontaneously. Psych: Normal affect.  Labs    Chemistry Recent Labs  Lab 03/31/17 1558 04/01/17 0247 04/02/17 0406 04/03/17 0444 04/04/17 0603  NA 139 140 139 139 140  K 4.4 3.3* 4.0 3.3* 3.3*  CL 102 101 97* 96* 97*  CO2 24 27 30 29 31   GLUCOSE 110* 82 93 90 98  BUN 44* 40* 42* 41* 55*  CREATININE 1.72* 1.59* 1.67* 1.59* 1.53*  CALCIUM 9.1 8.5* 8.8* 8.7* 8.9  PROT 7.4 6.8  --   --   --   ALBUMIN 3.7 3.2*  --   --   --   AST 61* 48*  --   --   --   ALT 54 46  --   --   --   ALKPHOS 64 53  --   --   --   BILITOT 0.9 0.7  --   --   --   GFRNONAA 33* 36* 34* 36* 37*  GFRAA 38* 41* 39* 41* 43*  ANIONGAP 13 12 12 14 12      Hematology Recent  Labs  Lab 03/31/17 1558 04/01/17 0248  WBC 8.1 7.0  RBC 4.14* 3.88*  HGB 11.7* 11.0*  HCT 35.9* 33.9*  MCV 86.7 87.4  MCH 28.3 28.4  MCHC 32.6 32.4  RDW 18.1* 17.9*  PLT 255 235    Cardiac Enzymes Recent Labs  Lab 03/31/17 1558 03/31/17 2101 04/01/17 0247 04/01/17 0845  TROPONINI 0.09* 0.10* 0.10* 0.09*   No results for input(s): TROPIPOC in the last 168 hours.   BNP Recent Labs  Lab 03/31/17 1558  BNP 2,093.0*     DDimer No results for input(s): DDIMER in the last 168 hours.   Radiology    Dg Swallowing Func-speech Pathology  Result Date: 04/02/2017 Objective Swallowing Evaluation: Type of Study: MBS-Modified Barium Swallow Study  Patient Details Name: Jeffrey Mccarty MRN: [id number] Date of Birth: Feb 20, 1921 Today's Date: 04/02/2017 Time: SLP Start Time (ACUTE ONLY): 1330 -SLP Stop Time (ACUTE ONLY): 1415 SLP Time Calculation (min) (ACUTE ONLY): 45 min Past Medical History: Past Medical History: Diagnosis Date . Aortic stenosis   a. moderate AS by echo in 04/2015 . Atrial fibrillation (HCC)   on  Eliquis . Elevated PSA  . Emphysema lung (HCC)  . Hypertension   no longer on medication . Hypothyroid  . Insomnia  . Kidney stone  . Mitral regurgitation 03/25/2017  moderate . Polymyalgia rheumatica (HCC)   family is not aware of this diagnosis . Pressure ulcer  . Reflux  Past Surgical History: Past Surgical History: Procedure Laterality Date . APPENDECTOMY   . CHOLECYSTECTOMY   . THYROIDECTOMY   . TONSILLECTOMY   HPI: 84 year old male with history of A. fib on anticoagulation, chronic kidney disease stage III, chronic combined systolic and diastolic CHF (EF of 40-45%), mild to moderate left ear and moderate MR, chronic indwelling Foley, who presented from home with increasing lower leg edema for 2 weeks duration.  His Lasix dose was increased from 20 mg daily to 40 mg daily by his cardiology.  Patient reported some epigastric discomfort but no fevers, chills, chest pain, abdominal pain, nausea, vomiting, bowel or urinary symptoms.  Subjective: "Okay" Assessment / Plan / Recommendation CHL IP CLINICAL IMPRESSIONS 04/02/2017 Clinical Impression Pt presents with moderate oropharyngeal phase dysphagia characterized by weak lingual manipulation resulting in reduced bolus cohesiveness, anterior posterior transit, piecemeal deglutition, and lingual residuals; premature spillage with min delay in swallow initiation, reduced tongue base retraction, and epiglottic deflection resulting in vallecular residuals which then spill to pyriforms during repeat swallows (epiglottis never fully deflected during the study) and trace, variable penetration of nectars and thins during the swallow and likely trace amounts after the swallow from vallecular residuals. Penetration was in trace amounts and generally observed tracking down the underside of the epiglottis, which only reached the vocal folds x1 and resulted in trace, silent aspiration during sequential straw sips of nectar. Effortful swallows were attempted and found to be  ineffective to mitigate/reduce pharyngeal residuals with purees/mech soft. A liquid wash and several dry swallows were most effective in reducing pharyngeal residue. A cued cough followed by a dry swallow were also found to be effective. The trace amount of aspirated material (either NTL or thin residuals) was not seen tracking down the anterior tracheal wall and it is suspected that Pt removed it. Pt with brief retention of barium tablet near the valleculae and he was noted to spontaneously throw his head back (which family says is baseline) when swallowing it, likely trying to bypass valleculae as Pt with long, curled epiglottis which  never fully deflected. Esophageal sweep was completed and initially showed barium retention in distal esophagus, which appeared to increase in amount following significant eructation. Backflow of barium noted. Pt reports h/o reflux. Pt is at moderate risk for aspiration due to pharyngeal weakness and suspect some degree of esophageal phase dysphagia placing him at risk for post prandial aspiration. Because there was no great difference in nectars and thins, recommend D2/finely chopped and THIN liquids via small sips (cup or straw) with periodic cough and repeat swallows; alternate solids/liquids intermittently and adhere to reflux and general aspiration precautions. OK for po medications whole in puree. This study was reviewed with Pt and his daughter. Pt is very HOH, so will bring written strategies to room. SLP Visit Diagnosis Dysphagia, oropharyngeal phase (R13.12) Attention and concentration deficit following -- Frontal lobe and executive function deficit following -- Impact on safety and function Moderate aspiration risk   CHL IP TREATMENT RECOMMENDATION 04/02/2017 Treatment Recommendations Therapy as outlined in treatment plan below   Prognosis 04/02/2017 Prognosis for Safe Diet Advancement Fair Barriers to Reach Goals Severity of deficits Barriers/Prognosis Comment work of  breathing CHL IP DIET RECOMMENDATION 04/02/2017 SLP Diet Recommendations Dysphagia 2 (Fine chop) solids;Thin liquid Liquid Administration via Cup;Straw Medication Administration Whole meds with puree Compensations Slow rate;Small sips/bites;Multiple dry swallows after each bite/sip;Follow solids with liquid;Clear throat intermittently Postural Changes Remain semi-upright after after feeds/meals (Comment);Seated upright at 90 degrees   CHL IP OTHER RECOMMENDATIONS 04/02/2017 Recommended Consults Consider esophageal assessment Oral Care Recommendations Oral care BID;Staff/trained caregiver to provide oral care Other Recommendations Clarify dietary restrictions   CHL IP FOLLOW UP RECOMMENDATIONS 04/02/2017 Follow up Recommendations Skilled Nursing facility;24 hour supervision/assistance   CHL IP FREQUENCY AND DURATION 04/02/2017 Speech Therapy Frequency (ACUTE ONLY) min 2x/week Treatment Duration 1 week      CHL IP ORAL PHASE 04/02/2017 Oral Phase Impaired Oral - Pudding Teaspoon -- Oral - Pudding Cup -- Oral - Honey Teaspoon -- Oral - Honey Cup -- Oral - Nectar Teaspoon Lingual/palatal residue;Decreased bolus cohesion;Premature spillage Oral - Nectar Cup -- Oral - Nectar Straw -- Oral - Thin Teaspoon -- Oral - Thin Cup -- Oral - Thin Straw -- Oral - Puree Decreased bolus cohesion;Premature spillage;Piecemeal swallowing;Lingual/palatal residue Oral - Mech Soft Weak lingual manipulation;Impaired mastication;Lingual/palatal residue;Decreased bolus cohesion Oral - Regular -- Oral - Multi-Consistency -- Oral - Pill Reduced posterior propulsion Oral Phase - Comment --  CHL IP PHARYNGEAL PHASE 04/02/2017 Pharyngeal Phase Impaired Pharyngeal- Pudding Teaspoon -- Pharyngeal -- Pharyngeal- Pudding Cup -- Pharyngeal -- Pharyngeal- Honey Teaspoon -- Pharyngeal -- Pharyngeal- Honey Cup -- Pharyngeal -- Pharyngeal- Nectar Teaspoon Delayed swallow initiation-vallecula;Reduced epiglottic inversion;Reduced tongue base  retraction;Penetration/Aspiration before swallow;Pharyngeal residue - valleculae;Pharyngeal residue - pyriform;Pharyngeal residue - posterior pharnyx;Lateral channel residue Pharyngeal Material does not enter airway;Material enters airway, remains ABOVE vocal cords then ejected out Pharyngeal- Nectar Cup -- Pharyngeal -- Pharyngeal- Nectar Straw Delayed swallow initiation-pyriform sinuses;Reduced epiglottic inversion;Reduced airway/laryngeal closure;Reduced tongue base retraction;Penetration/Aspiration during swallow;Trace aspiration;Pharyngeal residue - valleculae;Pharyngeal residue - pyriform;Lateral channel residue Pharyngeal Material enters airway, remains ABOVE vocal cords then ejected out;Material enters airway, passes BELOW cords without attempt by patient to eject out (silent aspiration) Pharyngeal- Thin Teaspoon Delayed swallow initiation-pyriform sinuses;Penetration/Aspiration during swallow;Reduced epiglottic inversion;Reduced airway/laryngeal closure;Reduced tongue base retraction;Pharyngeal residue - valleculae;Pharyngeal residue - pyriform Pharyngeal Material does not enter airway;Material enters airway, remains ABOVE vocal cords then ejected out;Material enters airway, remains ABOVE vocal cords and not ejected out Pharyngeal- Thin Cup Delayed swallow initiation-pyriform sinuses;Penetration/Aspiration during swallow;Reduced epiglottic inversion;Reduced  airway/laryngeal closure;Reduced tongue base retraction;Pharyngeal residue - valleculae;Pharyngeal residue - pyriform Pharyngeal Material does not enter airway;Material enters airway, remains ABOVE vocal cords then ejected out Pharyngeal- Thin Straw Delayed swallow initiation-pyriform sinuses;Reduced epiglottic inversion;Reduced airway/laryngeal closure;Reduced tongue base retraction;Penetration/Aspiration during swallow;Pharyngeal residue - valleculae;Pharyngeal residue - pyriform Pharyngeal Material does not enter airway;Material enters airway, remains  ABOVE vocal cords then ejected out;Material enters airway, remains ABOVE vocal cords and not ejected out Pharyngeal- Puree Delayed swallow initiation-vallecula;Reduced epiglottic inversion;Reduced tongue base retraction;Pharyngeal residue - valleculae Pharyngeal -- Pharyngeal- Mechanical Soft Delayed swallow initiation-vallecula;Reduced epiglottic inversion;Reduced tongue base retraction;Pharyngeal residue - valleculae Pharyngeal -- Pharyngeal- Regular -- Pharyngeal -- Pharyngeal- Multi-consistency -- Pharyngeal -- Pharyngeal- Pill Pharyngeal residue - valleculae;Reduced epiglottic inversion;Reduced tongue base retraction Pharyngeal -- Pharyngeal Comment reduced tongue base retraction and epiglottic deflection  CHL IP CERVICAL ESOPHAGEAL PHASE 04/02/2017 Cervical Esophageal Phase Impaired Pudding Teaspoon -- Pudding Cup -- Honey Teaspoon -- Honey Cup -- Nectar Teaspoon -- Nectar Cup -- Nectar Straw Esophageal backflow into the pharynx Thin Teaspoon -- Thin Cup -- Thin Straw -- Puree -- Mechanical Soft -- Regular -- Multi-consistency -- Pill -- Cervical Esophageal Comment Esophageal sweep revealed some backflow of contrast in distal esophagus No flowsheet data found. Thank you, Havery Moros, CCC-SLP [phone] PORTER,DABNEY 04/02/2017, 6:51 PM               Cardiac Studies   Echo: 03/25/2017 Study Conclusions  - Left ventricle: The cavity size was normal. Wall thickness was   increased in a pattern of mild LVH. Systolic function was mildly   to moderately reduced. The estimated ejection fraction was in the   range of 40% to 45%. Diffuse hypokinesis. The study was not   technically sufficient to allow evaluation of LV diastolic   dysfunction due to atrial fibrillation. Doppler parameters are   consistent with high ventricular filling pressure. - Aortic valve: Moderately to severely calcified annulus.   Trileaflet; moderately calcified leaflets. There was mild to   moderate stenosis. Peak velocity (S):  279 cm/s. Mean gradient   (S): 18 mm Hg. Valve area (VTI): 1.09 cm^2. Valve area (Vmax):   1.14 cm^2. Valve area (Vmean): 1.15 cm^2. - Mitral valve: Mildly thickened leaflets . There was moderate   regurgitation. - Left atrium: The atrium was severely dilated. - Right ventricle: Systolic function was moderately reduced. - Right atrium: The atrium was moderately dilated. - Tricuspid valve: There was moderate regurgitation. There were two   distinct jets noted. - Pulmonary arteries: PA peak pressure: 38 mm Hg (S).   Patient Profile     84 y.o. male w/ PMH of chronic combined systolic and diastolic CHF (EF 82-95% by echo in 03/2017), moderate AS, HTN, and Stage 3 CKD who presented to [facility] on 03/31/2017 for worsening dyspnea and URI symptoms.    Assessment & Plan    1. Acute on Chronic Combined Systolic and Diastolic CHF - presented with worsening dyspnea and found to have an elevated BNP of 2093 and CXR consistent with CHF.    - has been diuresing well with IV Lasix and is overall -6.7 L since admission (-1.7L yesterday). Weight is down from 109 lbs to 102 lbs (weight not recorded for today). Kidney function continues to trend down, at 1.53 today. Overall, his volume status appears significantly improved. Would consider transitioning back to PO Lasix 40mg  daily with instructions to take an additional tablet for weight gain > 3 lbs in 1 day or > 5 lbs in 1 week.  2. Persistent Atrial Fibrillation - HR remains well-controlled in the 70's to 80's. Remains on PTA regimen of Digoxin 0.125 mg every other day.  - No evidence of active bleeding. Continue Eliquis 2.5mg  BID for anticoagulation.   3. Aortic Stenosis - mild tomoderate by recent echo with mean gradient of 18 mm Hg. Valve area (Vmax): 1.14 cm^2.  - the patient and his family prefer conservative options and there are no plans for further intervention.   4. Elevated Troponin - values have been flat at 0.09, 0.10, 0.10, and 0.09.  Likely secondary to demand ischemia in the setting of his CHF exacerbation. No plans for further ischemic evaluation.  5. Hypokalemia - K+ 3.3 this AM. Will replace.    For questions or updates, please contact [facility] Please consult [email or web address] for contact info under Cardiology/STEMI.   Lorri Frederick , PA-C 8:05 AM 04/04/2017 Pager: [phone]  Attending note Patient seen and discussed with PA Iran Ouch, I agree with her documentation above. Being managed for acute on chronic combined systolic/diastolic HF. Negative 1.7L yesterday, neg 6.7 liters since admission. He received lasix 40mg  IV bid yesterday. Downtrending Cr, uptrending BUN. Appears near euvolemic on exam. Will d/c IV lasix, start oral today. Would start lasix 40mg  TThSatSun and 60mg  MWF.   We will sing off inpatient care. We will arrange outpatient f/u next week for patient. Will need BMET/Mg done at that time as well Dina Rich MD

## 2017-04-04 NOTE — Plan of Care (Signed)
progressing 

## 2017-04-04 NOTE — Progress Notes (Signed)
Removed IV-clean, dry, intact. Reviewed d/c paperwork with pt and daughter. Discussed medication changes. Answered all questions. Wheeled stable pt and belongings to main entrance where his daughter picked him up.  [facility] came and delivered oxygen and discussed home health with pt and daughters.

## 2017-04-04 NOTE — Progress Notes (Signed)
SATURATION QUALIFICATIONS: (This note is used to comply with regulatory documentation for home oxygen)  Patient Saturations on Room Air at Rest = 90%  Patient Saturations on Room Air while Ambulating = 88%  Patient Saturations on 2 Liters of oxygen while Ambulating = 95%  Please briefly explain why patient needs home oxygen: 

## 2017-04-04 NOTE — Care Management Note (Signed)
Case Management Note  Patient Details  Name: Marlis EdelsonRoy C Gallardo MRN: [id number] Date of Birth: [birth year]  Expected Discharge Date:  04/04/17               Expected Discharge Plan:  Home w Home Health Services  In-House Referral:  NA  Discharge planning Services  CM Consult  Post Acute Care Choice:  Home Health Choice offered to:  Adult Children, Patient  DME Arranged:  Other see comment(Transfer chair), home oxygen DME Agency:  [facility]  HH Arranged:  RN, PT, Speech Therapy HH Agency:  [facility]  Status of Service:  Completed, signed off  If discussed at Long Length of Stay Meetings, dates discussed:    Additional Comments: [facility] Daughter at bedside, aware HH has 48 hrs to make first visit. Will need transfer chair for home use. Olegario MessierKathy, [facility], aware of DME referral and DC home today. Sister aware item may need to be picked up from store or shipped to pt home. [facility] rep to discuss further with daughter at bedside. Pt will need home O2, Olegario MessierKathy is aware of this also and will deliver port tanks to pt room.  Malcolm Metrohildress, Glorianne Proctor Demske, RN 04/04/2017, 1:31 PM

## 2017-04-05 ENCOUNTER — Telehealth: Payer: Self-pay | Admitting: Family Medicine

## 2017-04-05 DIAGNOSIS — L89222 Pressure ulcer of left hip, stage 2: Secondary | ICD-10-CM | POA: Diagnosis not present

## 2017-04-05 DIAGNOSIS — R339 Retention of urine, unspecified: Secondary | ICD-10-CM | POA: Diagnosis not present

## 2017-04-05 DIAGNOSIS — I5043 Acute on chronic combined systolic (congestive) and diastolic (congestive) heart failure: Secondary | ICD-10-CM | POA: Diagnosis not present

## 2017-04-05 DIAGNOSIS — L89322 Pressure ulcer of left buttock, stage 2: Secondary | ICD-10-CM | POA: Diagnosis not present

## 2017-04-05 DIAGNOSIS — R1310 Dysphagia, unspecified: Secondary | ICD-10-CM | POA: Diagnosis not present

## 2017-04-05 DIAGNOSIS — I130 Hypertensive heart and chronic kidney disease with heart failure and stage 1 through stage 4 chronic kidney disease, or unspecified chronic kidney disease: Secondary | ICD-10-CM | POA: Diagnosis not present

## 2017-04-05 DIAGNOSIS — J439 Emphysema, unspecified: Secondary | ICD-10-CM | POA: Diagnosis not present

## 2017-04-05 DIAGNOSIS — N183 Chronic kidney disease, stage 3 (moderate): Secondary | ICD-10-CM | POA: Diagnosis not present

## 2017-04-05 DIAGNOSIS — I482 Chronic atrial fibrillation: Secondary | ICD-10-CM | POA: Diagnosis not present

## 2017-04-05 NOTE — Telephone Encounter (Signed)
Jeffrey Mccarty was just discharged from the hospital and [facility] care orders.

## 2017-04-05 NOTE — Telephone Encounter (Signed)
Maria notified

## 2017-04-05 NOTE — Telephone Encounter (Signed)
sure

## 2017-04-08 DIAGNOSIS — L8990 Pressure ulcer of unspecified site, unspecified stage: Secondary | ICD-10-CM | POA: Diagnosis not present

## 2017-04-08 DIAGNOSIS — J449 Chronic obstructive pulmonary disease, unspecified: Secondary | ICD-10-CM | POA: Diagnosis not present

## 2017-04-08 DIAGNOSIS — N289 Disorder of kidney and ureter, unspecified: Secondary | ICD-10-CM | POA: Diagnosis not present

## 2017-04-08 DIAGNOSIS — M6281 Muscle weakness (generalized): Secondary | ICD-10-CM | POA: Diagnosis not present

## 2017-04-09 ENCOUNTER — Telehealth: Payer: Self-pay | Admitting: Family Medicine

## 2017-04-09 DIAGNOSIS — L89322 Pressure ulcer of left buttock, stage 2: Secondary | ICD-10-CM | POA: Diagnosis not present

## 2017-04-09 DIAGNOSIS — I5043 Acute on chronic combined systolic (congestive) and diastolic (congestive) heart failure: Secondary | ICD-10-CM | POA: Diagnosis not present

## 2017-04-09 DIAGNOSIS — I482 Chronic atrial fibrillation: Secondary | ICD-10-CM | POA: Diagnosis not present

## 2017-04-09 DIAGNOSIS — R1310 Dysphagia, unspecified: Secondary | ICD-10-CM | POA: Diagnosis not present

## 2017-04-09 DIAGNOSIS — N183 Chronic kidney disease, stage 3 (moderate): Secondary | ICD-10-CM | POA: Diagnosis not present

## 2017-04-09 DIAGNOSIS — R339 Retention of urine, unspecified: Secondary | ICD-10-CM | POA: Diagnosis not present

## 2017-04-09 DIAGNOSIS — J439 Emphysema, unspecified: Secondary | ICD-10-CM | POA: Diagnosis not present

## 2017-04-09 DIAGNOSIS — L89222 Pressure ulcer of left hip, stage 2: Secondary | ICD-10-CM | POA: Diagnosis not present

## 2017-04-09 DIAGNOSIS — I130 Hypertensive heart and chronic kidney disease with heart failure and stage 1 through stage 4 chronic kidney disease, or unspecified chronic kidney disease: Secondary | ICD-10-CM | POA: Diagnosis not present

## 2017-04-09 NOTE — Telephone Encounter (Signed)
Physical therapy with [facility] is requesting physical therapy orders for 2x a wk for 2 weeks and then 1x a wk for a wk. Please advise.

## 2017-04-09 NOTE — Telephone Encounter (Signed)
sure

## 2017-04-09 NOTE — Telephone Encounter (Signed)
Please advise 

## 2017-04-09 NOTE — Telephone Encounter (Signed)
Left a message for Amy with [facility] to r/c.

## 2017-04-11 ENCOUNTER — Telehealth: Payer: Self-pay | Admitting: Family Medicine

## 2017-04-11 DIAGNOSIS — L89322 Pressure ulcer of left buttock, stage 2: Secondary | ICD-10-CM | POA: Diagnosis not present

## 2017-04-11 DIAGNOSIS — R339 Retention of urine, unspecified: Secondary | ICD-10-CM | POA: Diagnosis not present

## 2017-04-11 DIAGNOSIS — I482 Chronic atrial fibrillation: Secondary | ICD-10-CM | POA: Diagnosis not present

## 2017-04-11 DIAGNOSIS — N183 Chronic kidney disease, stage 3 (moderate): Secondary | ICD-10-CM | POA: Diagnosis not present

## 2017-04-11 DIAGNOSIS — R1310 Dysphagia, unspecified: Secondary | ICD-10-CM | POA: Diagnosis not present

## 2017-04-11 DIAGNOSIS — I130 Hypertensive heart and chronic kidney disease with heart failure and stage 1 through stage 4 chronic kidney disease, or unspecified chronic kidney disease: Secondary | ICD-10-CM | POA: Diagnosis not present

## 2017-04-11 DIAGNOSIS — J439 Emphysema, unspecified: Secondary | ICD-10-CM | POA: Diagnosis not present

## 2017-04-11 DIAGNOSIS — L89222 Pressure ulcer of left hip, stage 2: Secondary | ICD-10-CM | POA: Diagnosis not present

## 2017-04-11 DIAGNOSIS — I5043 Acute on chronic combined systolic (congestive) and diastolic (congestive) heart failure: Secondary | ICD-10-CM | POA: Diagnosis not present

## 2017-04-11 NOTE — Telephone Encounter (Signed)
Not at this age prescription  Can add one half 25 bmg benadryl but even with that experts discourage using too often

## 2017-04-11 NOTE — Telephone Encounter (Signed)
Tried to call Amy but voicemail is full; called [facility] office and left message for Amy on her voicemail

## 2017-04-11 NOTE — Telephone Encounter (Signed)
Morrie Sheldonshley with [facility] called because she just had a visit with patient and said family is saying he is not sleeping.  They have been giving him Melatonin 10 mg but it's not working.  Is there something stronger they can use?  Please call Morrie Sheldonshley at [phone]

## 2017-04-11 NOTE — Telephone Encounter (Signed)
Morrie Sheldon at [facility] notified and verbalized understanding.

## 2017-04-12 DIAGNOSIS — R1310 Dysphagia, unspecified: Secondary | ICD-10-CM | POA: Diagnosis not present

## 2017-04-12 DIAGNOSIS — J439 Emphysema, unspecified: Secondary | ICD-10-CM | POA: Diagnosis not present

## 2017-04-12 DIAGNOSIS — L89222 Pressure ulcer of left hip, stage 2: Secondary | ICD-10-CM | POA: Diagnosis not present

## 2017-04-12 DIAGNOSIS — R339 Retention of urine, unspecified: Secondary | ICD-10-CM | POA: Diagnosis not present

## 2017-04-12 DIAGNOSIS — I5043 Acute on chronic combined systolic (congestive) and diastolic (congestive) heart failure: Secondary | ICD-10-CM | POA: Diagnosis not present

## 2017-04-12 DIAGNOSIS — N183 Chronic kidney disease, stage 3 (moderate): Secondary | ICD-10-CM | POA: Diagnosis not present

## 2017-04-12 DIAGNOSIS — I130 Hypertensive heart and chronic kidney disease with heart failure and stage 1 through stage 4 chronic kidney disease, or unspecified chronic kidney disease: Secondary | ICD-10-CM | POA: Diagnosis not present

## 2017-04-12 DIAGNOSIS — L89322 Pressure ulcer of left buttock, stage 2: Secondary | ICD-10-CM | POA: Diagnosis not present

## 2017-04-12 DIAGNOSIS — I482 Chronic atrial fibrillation: Secondary | ICD-10-CM | POA: Diagnosis not present

## 2017-04-12 NOTE — Telephone Encounter (Signed)
[facility]. PT is also delayed a few day; will see as soon as she can

## 2017-04-12 NOTE — Telephone Encounter (Signed)
Tried to call voicemail is full  

## 2017-04-15 ENCOUNTER — Encounter: Payer: Self-pay | Admitting: Family Medicine

## 2017-04-15 ENCOUNTER — Ambulatory Visit (INDEPENDENT_AMBULATORY_CARE_PROVIDER_SITE_OTHER): Payer: Medicare HMO | Admitting: Family Medicine

## 2017-04-15 VITALS — BP 128/60 | Ht 61.0 in | Wt 112.0 lb

## 2017-04-15 DIAGNOSIS — Z13220 Encounter for screening for lipoid disorders: Secondary | ICD-10-CM | POA: Diagnosis not present

## 2017-04-15 DIAGNOSIS — I10 Essential (primary) hypertension: Secondary | ICD-10-CM | POA: Diagnosis not present

## 2017-04-15 DIAGNOSIS — I5021 Acute systolic (congestive) heart failure: Secondary | ICD-10-CM | POA: Diagnosis not present

## 2017-04-15 NOTE — Progress Notes (Signed)
   Subjective:    Patient ID: Jeffrey Mccarty, male    DOB: 1921-09-19, 84 y.o.   MRN: [id number]  HPI Patient is here today to follow up on chronic health conditions.He is on Lasix 20 mg three po M,W,F and takes two po all other days.He has  Hypothyroidism and is on levothyroxine 75 mg one daily.  Complete hospital record reviewed in presence of patient.  Arrives for follow-up from the hospital.  Was in the hospital with exacerbation of congestive heart failure.  Also had exacerbation dementia.  Now on chronic oxygen.    Some sleep disturbance recently  We did contact the patient within 48 hours of discharge.  Blood work near the time of discharge showed low potassium.  Patient complains of no pain at this time.  Family caretaker experiencing some stress with the work of trying to care for him at home I  No headache, no major weight loss or weight gain, no chest pain no back pain abdominal pain no change in bowel habits complete ROS otherwise negative     Objective:   Physical Exam Alert and oriented, vitals reviewed and stable, NAD ENT-TM's and ext canals WNL bilat via otoscopic exam Soft palate, tonsils and post pharynx WNL via oropharyngeal exam Neck-symmetric, no masses; thyroid nonpalpable and nontender Pulmonary-no tachypnea or accessory muscle use; Clear without wheezes via auscultation Card--no abnrml murmurs, rhythm reg and rate WNL Carotid pulses symmetric, without bruits        Assessment & Plan:  Impression exacerbation congestive heart discussed.  Maintain same dose of Lasix maintain oxygen.  Will check potassium level  2.  Dementia with element of insomnia.  No real good choices at this time.  Recommend no prescription meds rationale discussed  Follow-up as scheduled.  Appropriate results.  1

## 2017-04-15 NOTE — Progress Notes (Signed)
Cardiology Office Note    Date:  04/16/2017   ID:  Jeffrey Mccarty, DOB 1921/11/22, MRN [id number]  PCP:  Merlyn AlbertLuking, [name], MD  Cardiologist: Dina RichBranch, Jonathan, MD    Chief Complaint  Patient presents with  . Hospitalization Follow-up    History of Present Illness:    Jeffrey Mccarty is a 84 y.o. male with past medical history of chronic combined systolic and diastolic CHF (EF 69-67%40-45% by echo in 03/2017), PAF (on Eliquis), moderate AS, Stage 3 CKD and HTN who presents to the office today for hospital follow-up.   He was recently admitted to [facility] on 04/01/2017 for worsening dyspnea on exertion at home. BNP was found to be elevated to 2093 and CXR consistent with CHF exacerbation. He diuresed well with IV Lasix and was overall - 6.7 L during admission with weight declining from 109 lbs to 102 lbs. Kidney function improved to 1.53 (baseline 1.4 - 1.5) at the time of discharge and he was started on PO Lasix 40 mg daily with instructions to take 60mg  on MWF.   In talking with the patient today, most history is provided by his 2 daughters who are present during the encounter. They report his breathing has significantly improved since hospital discharge. Weight has been stable at 104-105 lbs on his home scales. He has been using 2L nasal cannula 24/7.  He reports improvement in his orthopnea. No recent chest pain, palpitations, or lower extremity edema.  Labs were recently checked by his PCP on 04/15/2017 and showed creatinine was elevated to 1.94 with a K+ of 4.8.   Past Medical History:  Diagnosis Date  . Aortic stenosis    a. moderate AS by echo in 04/2015  . Atrial fibrillation (HCC)    on Eliquis  . Elevated PSA   . Emphysema lung (HCC)   . Hypertension    no longer on medication  . Hypothyroid   . Insomnia   . Kidney stone   . Mitral regurgitation 03/25/2017   moderate  . Polymyalgia rheumatica (HCC)    family is not aware of this diagnosis  . Pressure ulcer   . Reflux      Past Surgical History:  Procedure Laterality Date  . APPENDECTOMY    . CHOLECYSTECTOMY    . THYROIDECTOMY    . TONSILLECTOMY      Current Medications: Outpatient Medications Prior to Visit  Medication Sig Dispense Refill  . Acetaminophen (TYLENOL) 325 MG CAPS Take 1 capsule by mouth daily as needed.    Marland Kitchen. DIGOX 125 MCG tablet take 1 tablet by mouth every other day 45 tablet 1  . ELIQUIS 2.5 MG TABS tablet take 1 tablet by mouth twice a day 90 tablet 3  . feeding supplement, ENSURE COMPLETE, (ENSURE COMPLETE) LIQD Take 237 mLs by mouth 2 (two) times daily between meals. 30 Bottle 1  . levothyroxine (SYNTHROID, LEVOTHROID) 75 MCG tablet Take 1 tablet (75 mcg total) by mouth daily before breakfast. 30 tablet 12  . loratadine (CLARITIN) 10 MG tablet Take 10 mg by mouth daily.    . Melatonin 1 MG TABS Take 1 mg by mouth at bedtime as needed (sleep).    . pantoprazole (PROTONIX) 40 MG tablet Take 1 tablet (40 mg total) by mouth daily. 30 tablet 5  . potassium chloride SA (K-DUR,KLOR-CON) 20 MEQ tablet Take 20 mEq by mouth daily.    . psyllium (HYDROCIL/METAMUCIL) 95 % PACK Take 1 packet by mouth daily as needed  for mild constipation.    . triamcinolone ointment (KENALOG) 0.1 % apply to affected area twice a day 60 g 3  . furosemide (LASIX) 20 MG tablet Take 60 mg by mouth daily on Monday-Wednesday-Friday; and 40 mg daily on Tuesday-Thursday-Saturday and Sunday 120 tablet 1   No facility-administered medications prior to visit.      Allergies:   Cardura [doxazosin mesylate]   Social History   Socioeconomic History  . Marital status: Widowed    Spouse name: None  . Number of children: None  . Years of education: None  . Highest education level: None  Social Needs  . Financial resource strain: None  . Food insecurity - worry: None  . Food insecurity - inability: None  . Transportation needs - medical: None  . Transportation needs - non-medical: None  Occupational History  .  None  Tobacco Use  . Smoking status: Never Smoker  . Smokeless tobacco: Never Used  Substance and Sexual Activity  . Alcohol use: No    Alcohol/week: 0.0 oz  . Drug use: No  . Sexual activity: None  Other Topics Concern  . None  Social History Narrative  . None     Family History:  The patient's family history includes Heart attack in his father.   Review of Systems:   Please see the history of present illness.     General:  No chills, fever, night sweats or weight changes.  Cardiovascular:  No chest pain, edema, orthopnea, palpitations, paroxysmal nocturnal dyspnea. Positive for dyspnea on exertion (improving).  Dermatological: No rash, lesions/masses Respiratory: No cough, dyspnea Urologic: No hematuria, dysuria Abdominal:   No nausea, vomiting, diarrhea, bright red blood per rectum, melena, or hematemesis Neurologic:  No visual changes, wkns, changes in mental status. All other systems reviewed and are otherwise negative except as noted above.   Physical Exam:    VS:  BP (!) 118/52   Pulse 84   Ht 5\' 1"  (1.549 m)   Wt 110 lb 12.8 oz (50.3 kg)   SpO2 98%   BMI 20.94 kg/m    General: Well developed, elderly Caucasian male appearing in no acute distress. Head: Normocephalic, atraumatic, sclera non-icteric, no xanthomas, nares are without discharge.  Neck: No carotid bruits. JVD not elevated.  Lungs: Respirations regular and unlabored, without wheezes or rales. On 2L [place].  Heart: Irregularly irregular. No S3 or S4.  No rubs or gallops appreciated. 2/6 SEM along RUSB.  Abdomen: Soft, non-tender, non-distended with normoactive bowel sounds. No hepatomegaly. No rebound/guarding. No obvious abdominal masses. Msk:  Strength and tone appear normal for age. No joint deformities or effusions. Extremities: No clubbing or cyanosis. No lower extremity edema.  Distal pedal pulses are 2+ bilaterally. Neuro: Alert and oriented X 3. Moves all extremities spontaneously. No focal deficits  noted. Psych:  Responds to questions appropriately with a normal affect. Skin: No rashes or lesions noted  Wt Readings from Last 3 Encounters:  04/16/17 110 lb 12.8 oz (50.3 kg)  04/15/17 112 lb (50.8 kg)  04/03/17 102 lb 8.2 oz (46.5 kg)     Studies/Labs Reviewed:   EKG:  EKG is not ordered today.    Recent Labs: 03/31/2017: B Natriuretic Peptide 2,093.0; TSH 1.733 04/01/2017: ALT 46; Hemoglobin 11.0; Platelets 235 04/15/2017: BUN 33; Creatinine, Ser 1.94; Magnesium 1.9; Potassium 4.8; Sodium 141   Lipid Panel    Component Value Date/Time   CHOL 170 04/15/2017 1429   TRIG 60 04/15/2017 1429   HDL 64  04/15/2017 1429   CHOLHDL 2.7 04/15/2017 1429   CHOLHDL 2.2 04/01/2017 0247   VLDL 8 04/01/2017 0247   LDLCALC 94 04/15/2017 1429    Additional studies/ records that were reviewed today include:   Echocardiogram: 03/25/2017 Study Conclusions  - Left ventricle: The cavity size was normal. Wall thickness was   increased in a pattern of mild LVH. Systolic function was mildly   to moderately reduced. The estimated ejection fraction was in the   range of 40% to 45%. Diffuse hypokinesis. The study was not   technically sufficient to allow evaluation of LV diastolic   dysfunction due to atrial fibrillation. Doppler parameters are   consistent with high ventricular filling pressure. - Aortic valve: Moderately to severely calcified annulus.   Trileaflet; moderately calcified leaflets. There was mild to   moderate stenosis. Peak velocity (S): 279 cm/s. Mean gradient   (S): 18 mm Hg. Valve area (VTI): 1.09 cm^2. Valve area (Vmax):   1.14 cm^2. Valve area (Vmean): 1.15 cm^2. - Mitral valve: Mildly thickened leaflets . There was moderate   regurgitation. - Left atrium: The atrium was severely dilated. - Right ventricle: Systolic function was moderately reduced. - Right atrium: The atrium was moderately dilated. - Tricuspid valve: There was moderate regurgitation. There were two    distinct jets noted. - Pulmonary arteries: PA peak pressure: 38 mm Hg (S).   Assessment:    1. Chronic combined systolic and diastolic heart failure (HCC)   2. Persistent atrial fibrillation (HCC)   3. Nonrheumatic aortic valve stenosis   4. Essential hypertension   5. CKD (chronic kidney disease) stage 3, GFR 30-59 ml/min (HCC)      Plan:   In order of problems listed above:  1. Chronic Combined Systolic and Diastolic CHF - Patient has a known reduced EF of 40-45% by most recent echocardiogram last month. He was recently hospitalized for a CHF exacerbation. Reports overall doing well since and has noted significant improvement in his dyspnea. Is being followed by Home Health PT and RN.  - weight has been stable at 104 - 105 on his home scales. At 110 lbs today (dressed and full urinary catheter bag in place). Recent labs showed his creatinine was elevated to 1.94. With this rise in his kidney function, would recommend reducing Lasix back to 40 mg daily. Can take an additional 20mg  tablet as needed for edema or weight gain > 3 lbs overnight or > 5 lbs in one week.   2. Persistent Atrial Fibrillation - HR remains well controlled in the 80's during today's visit. Remains on Digoxin 0.125 mg every other day. - He denies any evidence of active bleeding.  Remains on Eliquis 2.5 mg twice daily.  3. Aortic Stenosis - mild to moderate AS by most recent echo.  - We will continue to follow.   4. HTN - BP is well-controlled at 118/52. - continue current medication regimen.   5. Stage 3 CKD - Baseline creatinine 1.4-1.5.  Elevated to 1.94 on 04/15/2017. - will reduce Lasix as outlined above. Recheck BMET at next office visit.     Medication Adjustments/Labs and Tests Ordered: Current medicines are reviewed at length with the patient today.  Concerns regarding medicines are outlined above.  Medication changes, Labs and Tests ordered today are listed in the Patient Instructions  below. Patient Instructions  Medication Instructions:  Your physician has recommended you make the following change in your medication:  Decrease Lasix to 40 mg Daily   Labwork:  NONE   Testing/Procedures: NONE   Follow-Up: Your physician recommends that you schedule a follow-up appointment in: 3-4 Weeks   Any Other Special Instructions Will Be Listed Below (If Applicable).  If you need a refill on your cardiac medications before your next appointment, please call your pharmacy.  Limit daily fluid intake to less than 2 Liters per day. Please limit salt intake.  Please weight yourself every morning. Take an extra Lasix tablet if weight increases by 3 pounds overnight or 5 pounds in a single week.  Thank you for choosing [facility]!    Signed, Ellsworth Lennox, PA-C  04/16/2017 3:33 PM    [facility] [address] [place], [place] [address] Phone: [phone]

## 2017-04-16 ENCOUNTER — Ambulatory Visit: Payer: Medicare HMO | Admitting: Student

## 2017-04-16 ENCOUNTER — Encounter: Payer: Self-pay | Admitting: Student

## 2017-04-16 VITALS — BP 118/52 | HR 84 | Ht 61.0 in | Wt 110.8 lb

## 2017-04-16 DIAGNOSIS — I130 Hypertensive heart and chronic kidney disease with heart failure and stage 1 through stage 4 chronic kidney disease, or unspecified chronic kidney disease: Secondary | ICD-10-CM | POA: Diagnosis not present

## 2017-04-16 DIAGNOSIS — L89322 Pressure ulcer of left buttock, stage 2: Secondary | ICD-10-CM | POA: Diagnosis not present

## 2017-04-16 DIAGNOSIS — I481 Persistent atrial fibrillation: Secondary | ICD-10-CM | POA: Diagnosis not present

## 2017-04-16 DIAGNOSIS — R1310 Dysphagia, unspecified: Secondary | ICD-10-CM | POA: Diagnosis not present

## 2017-04-16 DIAGNOSIS — J449 Chronic obstructive pulmonary disease, unspecified: Secondary | ICD-10-CM | POA: Diagnosis not present

## 2017-04-16 DIAGNOSIS — I4819 Other persistent atrial fibrillation: Secondary | ICD-10-CM

## 2017-04-16 DIAGNOSIS — M6281 Muscle weakness (generalized): Secondary | ICD-10-CM | POA: Diagnosis not present

## 2017-04-16 DIAGNOSIS — I5042 Chronic combined systolic (congestive) and diastolic (congestive) heart failure: Secondary | ICD-10-CM | POA: Diagnosis not present

## 2017-04-16 DIAGNOSIS — N183 Chronic kidney disease, stage 3 unspecified: Secondary | ICD-10-CM

## 2017-04-16 DIAGNOSIS — I10 Essential (primary) hypertension: Secondary | ICD-10-CM | POA: Diagnosis not present

## 2017-04-16 DIAGNOSIS — L89222 Pressure ulcer of left hip, stage 2: Secondary | ICD-10-CM | POA: Diagnosis not present

## 2017-04-16 DIAGNOSIS — I482 Chronic atrial fibrillation: Secondary | ICD-10-CM | POA: Diagnosis not present

## 2017-04-16 DIAGNOSIS — I350 Nonrheumatic aortic (valve) stenosis: Secondary | ICD-10-CM | POA: Diagnosis not present

## 2017-04-16 DIAGNOSIS — I5043 Acute on chronic combined systolic (congestive) and diastolic (congestive) heart failure: Secondary | ICD-10-CM | POA: Diagnosis not present

## 2017-04-16 DIAGNOSIS — L8990 Pressure ulcer of unspecified site, unspecified stage: Secondary | ICD-10-CM | POA: Diagnosis not present

## 2017-04-16 DIAGNOSIS — N289 Disorder of kidney and ureter, unspecified: Secondary | ICD-10-CM | POA: Diagnosis not present

## 2017-04-16 DIAGNOSIS — J439 Emphysema, unspecified: Secondary | ICD-10-CM | POA: Diagnosis not present

## 2017-04-16 DIAGNOSIS — R339 Retention of urine, unspecified: Secondary | ICD-10-CM | POA: Diagnosis not present

## 2017-04-16 LAB — BASIC METABOLIC PANEL
BUN/Creatinine Ratio: 17 (ref 10–24)
BUN: 33 mg/dL (ref 10–36)
CO2: 24 mmol/L (ref 20–29)
Calcium: 9.3 mg/dL (ref 8.6–10.2)
Chloride: 96 mmol/L (ref 96–106)
Creatinine, Ser: 1.94 mg/dL — ABNORMAL HIGH (ref 0.76–1.27)
GFR calc Af Amer: 34 mL/min/{1.73_m2} — ABNORMAL LOW (ref >59)
GFR calc non Af Amer: 29 mL/min/{1.73_m2} — ABNORMAL LOW (ref >59)
Glucose: 86 mg/dL (ref 65–99)
Potassium: 4.8 mmol/L (ref 3.5–5.2)
Sodium: 141 mmol/L (ref 134–144)

## 2017-04-16 LAB — LIPID PANEL
CHOL/HDL RATIO: 2.7 ratio (ref 0.0–5.0)
Cholesterol, Total: 170 mg/dL (ref 100–199)
HDL: 64 mg/dL (ref >39)
LDL Calculated: 94 mg/dL (ref 0–99)
TRIGLYCERIDES: 60 mg/dL (ref 0–149)
VLDL Cholesterol Cal: 12 mg/dL (ref 5–40)

## 2017-04-16 LAB — MAGNESIUM: Magnesium: 1.9 mg/dL (ref 1.6–2.3)

## 2017-04-16 MED ORDER — FUROSEMIDE 40 MG PO TABS: 40.0000 mg | ORAL_TABLET | Freq: Every day | ORAL | 3 refills | Status: AC

## 2017-04-16 NOTE — Patient Instructions (Signed)
Medication Instructions:  Your physician has recommended you make the following change in your medication:  Decrease Lasix to 40 mg Daily    Labwork: NONE   Testing/Procedures: NONE   Follow-Up: Your physician recommends that you schedule a follow-up appointment in: 3-4 Weeks    Any Other Special Instructions Will Be Listed Below (If Applicable).     If you need a refill on your cardiac medications before your next appointment, please call your pharmacy.  Limit daily fluid intake to less than 2 Liters per day. Please limit salt intake.  Please weight yourself every morning. Take an extra Lasix tablet if weight increases by 3 pounds overnight or 5 pounds in a single week.  Thank you for choosing [facility]!

## 2017-04-17 ENCOUNTER — Telehealth: Payer: Self-pay | Admitting: *Deleted

## 2017-04-17 DIAGNOSIS — R339 Retention of urine, unspecified: Secondary | ICD-10-CM | POA: Diagnosis not present

## 2017-04-17 DIAGNOSIS — R1310 Dysphagia, unspecified: Secondary | ICD-10-CM | POA: Diagnosis not present

## 2017-04-17 DIAGNOSIS — I482 Chronic atrial fibrillation: Secondary | ICD-10-CM | POA: Diagnosis not present

## 2017-04-17 DIAGNOSIS — N183 Chronic kidney disease, stage 3 (moderate): Secondary | ICD-10-CM | POA: Diagnosis not present

## 2017-04-17 DIAGNOSIS — J439 Emphysema, unspecified: Secondary | ICD-10-CM | POA: Diagnosis not present

## 2017-04-17 DIAGNOSIS — L89222 Pressure ulcer of left hip, stage 2: Secondary | ICD-10-CM | POA: Diagnosis not present

## 2017-04-17 DIAGNOSIS — I5043 Acute on chronic combined systolic (congestive) and diastolic (congestive) heart failure: Secondary | ICD-10-CM | POA: Diagnosis not present

## 2017-04-17 DIAGNOSIS — I130 Hypertensive heart and chronic kidney disease with heart failure and stage 1 through stage 4 chronic kidney disease, or unspecified chronic kidney disease: Secondary | ICD-10-CM | POA: Diagnosis not present

## 2017-04-17 DIAGNOSIS — L89322 Pressure ulcer of left buttock, stage 2: Secondary | ICD-10-CM | POA: Diagnosis not present

## 2017-04-17 NOTE — Telephone Encounter (Signed)
Morrie Sheldonshley from [facility]. Phone number [phone].   FYI Pressure area on left hip is breaking open and area on right hip that is not open yet but problably will end up open.  Family is putting barrier and a foam dressing on it.  Wants order for gel cushion. If ok just need a verbal order. And order for social worker to come out and talk with family about options.

## 2017-04-19 DIAGNOSIS — I5043 Acute on chronic combined systolic (congestive) and diastolic (congestive) heart failure: Secondary | ICD-10-CM | POA: Diagnosis not present

## 2017-04-19 DIAGNOSIS — J439 Emphysema, unspecified: Secondary | ICD-10-CM | POA: Diagnosis not present

## 2017-04-19 DIAGNOSIS — I130 Hypertensive heart and chronic kidney disease with heart failure and stage 1 through stage 4 chronic kidney disease, or unspecified chronic kidney disease: Secondary | ICD-10-CM | POA: Diagnosis not present

## 2017-04-19 DIAGNOSIS — N183 Chronic kidney disease, stage 3 (moderate): Secondary | ICD-10-CM | POA: Diagnosis not present

## 2017-04-19 DIAGNOSIS — I482 Chronic atrial fibrillation: Secondary | ICD-10-CM | POA: Diagnosis not present

## 2017-04-19 DIAGNOSIS — R1310 Dysphagia, unspecified: Secondary | ICD-10-CM | POA: Diagnosis not present

## 2017-04-19 DIAGNOSIS — R339 Retention of urine, unspecified: Secondary | ICD-10-CM | POA: Diagnosis not present

## 2017-04-19 DIAGNOSIS — L89222 Pressure ulcer of left hip, stage 2: Secondary | ICD-10-CM | POA: Diagnosis not present

## 2017-04-19 DIAGNOSIS — L89322 Pressure ulcer of left buttock, stage 2: Secondary | ICD-10-CM | POA: Diagnosis not present

## 2017-04-19 NOTE — Telephone Encounter (Signed)
Yes ok 

## 2017-04-22 ENCOUNTER — Other Ambulatory Visit: Payer: Self-pay

## 2017-04-22 ENCOUNTER — Inpatient Hospital Stay (HOSPITAL_COMMUNITY)
Admission: EM | Admit: 2017-04-22 | Discharge: 2017-04-29 | DRG: 291 | Disposition: E | Payer: Medicare HMO | Attending: Internal Medicine | Admitting: Internal Medicine

## 2017-04-22 ENCOUNTER — Encounter (HOSPITAL_COMMUNITY): Payer: Self-pay | Admitting: *Deleted

## 2017-04-22 ENCOUNTER — Emergency Department (HOSPITAL_COMMUNITY): Payer: Medicare HMO

## 2017-04-22 ENCOUNTER — Telehealth: Payer: Self-pay | Admitting: Cardiology

## 2017-04-22 DIAGNOSIS — N183 Chronic kidney disease, stage 3 (moderate): Secondary | ICD-10-CM | POA: Diagnosis not present

## 2017-04-22 DIAGNOSIS — E039 Hypothyroidism, unspecified: Secondary | ICD-10-CM

## 2017-04-22 DIAGNOSIS — R1110 Vomiting, unspecified: Secondary | ICD-10-CM | POA: Diagnosis not present

## 2017-04-22 DIAGNOSIS — W0110XA Fall on same level from slipping, tripping and stumbling with subsequent striking against unspecified object, initial encounter: Secondary | ICD-10-CM | POA: Diagnosis not present

## 2017-04-22 DIAGNOSIS — I5043 Acute on chronic combined systolic (congestive) and diastolic (congestive) heart failure: Secondary | ICD-10-CM | POA: Diagnosis not present

## 2017-04-22 DIAGNOSIS — I130 Hypertensive heart and chronic kidney disease with heart failure and stage 1 through stage 4 chronic kidney disease, or unspecified chronic kidney disease: Principal | ICD-10-CM | POA: Diagnosis present

## 2017-04-22 DIAGNOSIS — I080 Rheumatic disorders of both mitral and aortic valves: Secondary | ICD-10-CM | POA: Diagnosis present

## 2017-04-22 DIAGNOSIS — Z8249 Family history of ischemic heart disease and other diseases of the circulatory system: Secondary | ICD-10-CM | POA: Diagnosis not present

## 2017-04-22 DIAGNOSIS — J9602 Acute respiratory failure with hypercapnia: Secondary | ICD-10-CM | POA: Diagnosis not present

## 2017-04-22 DIAGNOSIS — Z960 Presence of urogenital implants: Secondary | ICD-10-CM

## 2017-04-22 DIAGNOSIS — K219 Gastro-esophageal reflux disease without esophagitis: Secondary | ICD-10-CM | POA: Diagnosis present

## 2017-04-22 DIAGNOSIS — I5021 Acute systolic (congestive) heart failure: Secondary | ICD-10-CM | POA: Diagnosis not present

## 2017-04-22 DIAGNOSIS — M353 Polymyalgia rheumatica: Secondary | ICD-10-CM | POA: Diagnosis present

## 2017-04-22 DIAGNOSIS — J439 Emphysema, unspecified: Secondary | ICD-10-CM | POA: Diagnosis not present

## 2017-04-22 DIAGNOSIS — Z888 Allergy status to other drugs, medicaments and biological substances status: Secondary | ICD-10-CM

## 2017-04-22 DIAGNOSIS — I481 Persistent atrial fibrillation: Secondary | ICD-10-CM | POA: Diagnosis not present

## 2017-04-22 DIAGNOSIS — S32029A Unspecified fracture of second lumbar vertebra, initial encounter for closed fracture: Secondary | ICD-10-CM | POA: Diagnosis not present

## 2017-04-22 DIAGNOSIS — I10 Essential (primary) hypertension: Secondary | ICD-10-CM | POA: Diagnosis present

## 2017-04-22 DIAGNOSIS — Y92230 Patient room in hospital as the place of occurrence of the external cause: Secondary | ICD-10-CM | POA: Diagnosis not present

## 2017-04-22 DIAGNOSIS — I482 Chronic atrial fibrillation, unspecified: Secondary | ICD-10-CM | POA: Diagnosis present

## 2017-04-22 DIAGNOSIS — N184 Chronic kidney disease, stage 4 (severe): Secondary | ICD-10-CM | POA: Diagnosis not present

## 2017-04-22 DIAGNOSIS — J9601 Acute respiratory failure with hypoxia: Secondary | ICD-10-CM | POA: Diagnosis not present

## 2017-04-22 DIAGNOSIS — I493 Ventricular premature depolarization: Secondary | ICD-10-CM | POA: Diagnosis present

## 2017-04-22 DIAGNOSIS — R748 Abnormal levels of other serum enzymes: Secondary | ICD-10-CM | POA: Diagnosis not present

## 2017-04-22 DIAGNOSIS — Z79899 Other long term (current) drug therapy: Secondary | ICD-10-CM | POA: Diagnosis not present

## 2017-04-22 DIAGNOSIS — L89322 Pressure ulcer of left buttock, stage 2: Secondary | ICD-10-CM | POA: Diagnosis not present

## 2017-04-22 DIAGNOSIS — Z9289 Personal history of other medical treatment: Secondary | ICD-10-CM | POA: Diagnosis not present

## 2017-04-22 DIAGNOSIS — D649 Anemia, unspecified: Secondary | ICD-10-CM | POA: Diagnosis present

## 2017-04-22 DIAGNOSIS — M546 Pain in thoracic spine: Secondary | ICD-10-CM | POA: Diagnosis not present

## 2017-04-22 DIAGNOSIS — M549 Dorsalgia, unspecified: Secondary | ICD-10-CM

## 2017-04-22 DIAGNOSIS — R0602 Shortness of breath: Secondary | ICD-10-CM

## 2017-04-22 DIAGNOSIS — R05 Cough: Secondary | ICD-10-CM

## 2017-04-22 DIAGNOSIS — R339 Retention of urine, unspecified: Secondary | ICD-10-CM | POA: Diagnosis not present

## 2017-04-22 DIAGNOSIS — Z9981 Dependence on supplemental oxygen: Secondary | ICD-10-CM

## 2017-04-22 DIAGNOSIS — H9190 Unspecified hearing loss, unspecified ear: Secondary | ICD-10-CM | POA: Diagnosis present

## 2017-04-22 DIAGNOSIS — J9621 Acute and chronic respiratory failure with hypoxia: Secondary | ICD-10-CM | POA: Diagnosis present

## 2017-04-22 DIAGNOSIS — M545 Low back pain: Secondary | ICD-10-CM | POA: Diagnosis not present

## 2017-04-22 DIAGNOSIS — S0101XA Laceration without foreign body of scalp, initial encounter: Secondary | ICD-10-CM | POA: Diagnosis not present

## 2017-04-22 DIAGNOSIS — R7989 Other specified abnormal findings of blood chemistry: Secondary | ICD-10-CM | POA: Diagnosis present

## 2017-04-22 DIAGNOSIS — Z7901 Long term (current) use of anticoagulants: Secondary | ICD-10-CM

## 2017-04-22 DIAGNOSIS — I350 Nonrheumatic aortic (valve) stenosis: Secondary | ICD-10-CM | POA: Diagnosis not present

## 2017-04-22 DIAGNOSIS — I509 Heart failure, unspecified: Secondary | ICD-10-CM

## 2017-04-22 DIAGNOSIS — S22050A Wedge compression fracture of T5-T6 vertebra, initial encounter for closed fracture: Secondary | ICD-10-CM | POA: Diagnosis not present

## 2017-04-22 DIAGNOSIS — L89222 Pressure ulcer of left hip, stage 2: Secondary | ICD-10-CM | POA: Diagnosis not present

## 2017-04-22 DIAGNOSIS — R1310 Dysphagia, unspecified: Secondary | ICD-10-CM | POA: Diagnosis not present

## 2017-04-22 DIAGNOSIS — Z515 Encounter for palliative care: Secondary | ICD-10-CM | POA: Diagnosis present

## 2017-04-22 DIAGNOSIS — R059 Cough, unspecified: Secondary | ICD-10-CM

## 2017-04-22 DIAGNOSIS — Z978 Presence of other specified devices: Secondary | ICD-10-CM

## 2017-04-22 DIAGNOSIS — S22059A Unspecified fracture of T5-T6 vertebra, initial encounter for closed fracture: Secondary | ICD-10-CM | POA: Diagnosis not present

## 2017-04-22 DIAGNOSIS — E890 Postprocedural hypothyroidism: Secondary | ICD-10-CM | POA: Diagnosis present

## 2017-04-22 DIAGNOSIS — Z66 Do not resuscitate: Secondary | ICD-10-CM | POA: Diagnosis present

## 2017-04-22 DIAGNOSIS — R0682 Tachypnea, not elsewhere classified: Secondary | ICD-10-CM | POA: Diagnosis not present

## 2017-04-22 DIAGNOSIS — R778 Other specified abnormalities of plasma proteins: Secondary | ICD-10-CM | POA: Diagnosis present

## 2017-04-22 HISTORY — DX: Chronic kidney disease, stage 4 (severe): N18.4

## 2017-04-22 LAB — CBC WITH DIFFERENTIAL/PLATELET
Basophils Absolute: 0.1 10*3/uL (ref 0.0–0.1)
Basophils Relative: 1 %
Eosinophils Absolute: 0.2 10*3/uL (ref 0.0–0.7)
Eosinophils Relative: 3 %
HEMATOCRIT: 33.7 % — AB (ref 39.0–52.0)
HEMOGLOBIN: 10.8 g/dL — AB (ref 13.0–17.0)
LYMPHS ABS: 1.3 10*3/uL (ref 0.7–4.0)
LYMPHS PCT: 21 %
MCH: 27.8 pg (ref 26.0–34.0)
MCHC: 32 g/dL (ref 30.0–36.0)
MCV: 86.9 fL (ref 78.0–100.0)
Monocytes Absolute: 0.7 10*3/uL (ref 0.1–1.0)
Monocytes Relative: 12 %
NEUTROS ABS: 3.9 10*3/uL (ref 1.7–7.7)
NEUTROS PCT: 63 %
Platelets: 290 10*3/uL (ref 150–400)
RBC: 3.88 MIL/uL — AB (ref 4.22–5.81)
RDW: 16.9 % — ABNORMAL HIGH (ref 11.5–15.5)
WBC: 6.2 10*3/uL (ref 4.0–10.5)

## 2017-04-22 LAB — COMPREHENSIVE METABOLIC PANEL
ALK PHOS: 44 U/L (ref 38–126)
ALT: 19 U/L (ref 17–63)
ANION GAP: 10 (ref 5–15)
AST: 30 U/L (ref 15–41)
Albumin: 3.3 g/dL — ABNORMAL LOW (ref 3.5–5.0)
BUN: 36 mg/dL — ABNORMAL HIGH (ref 6–20)
CO2: 28 mmol/L (ref 22–32)
Calcium: 8.7 mg/dL — ABNORMAL LOW (ref 8.9–10.3)
Chloride: 97 mmol/L — ABNORMAL LOW (ref 101–111)
Creatinine, Ser: 1.61 mg/dL — ABNORMAL HIGH (ref 0.61–1.24)
GFR, EST AFRICAN AMERICAN: 41 mL/min — AB (ref >60)
GFR, EST NON AFRICAN AMERICAN: 35 mL/min — AB (ref >60)
GLUCOSE: 97 mg/dL (ref 65–99)
Potassium: 4 mmol/L (ref 3.5–5.1)
Sodium: 135 mmol/L (ref 135–145)
TOTAL PROTEIN: 7.1 g/dL (ref 6.5–8.1)
Total Bilirubin: 0.6 mg/dL (ref 0.3–1.2)

## 2017-04-22 LAB — TROPONIN I
TROPONIN I: 0.06 ng/mL — AB (ref <0.03)
Troponin I: 0.07 ng/mL (ref <0.03)

## 2017-04-22 LAB — DIGOXIN LEVEL: DIGOXIN LVL: 1.3 ng/mL (ref 0.8–2.0)

## 2017-04-22 LAB — BRAIN NATRIURETIC PEPTIDE: B Natriuretic Peptide: 1597 pg/mL — ABNORMAL HIGH (ref 0.0–100.0)

## 2017-04-22 MED ORDER — APIXABAN 2.5 MG PO TABS
2.5000 mg | ORAL_TABLET | Freq: Two times a day (BID) | ORAL | Status: DC
  Administered 2017-04-23 – 2017-04-24 (×4): 2.5 mg via ORAL
  Filled 2017-04-22 (×8): qty 1

## 2017-04-22 MED ORDER — MELATONIN 1 MG PO TABS
1.0000 mg | ORAL_TABLET | Freq: Every day | ORAL | Status: DC
  Filled 2017-04-22 (×2): qty 1

## 2017-04-22 MED ORDER — FUROSEMIDE 10 MG/ML IJ SOLN
40.0000 mg | Freq: Once | INTRAMUSCULAR | Status: DC
  Filled 2017-04-22: qty 4

## 2017-04-22 MED ORDER — LEVOTHYROXINE SODIUM 75 MCG PO TABS
75.0000 ug | ORAL_TABLET | Freq: Every day | ORAL | Status: DC
Start: 2017-04-23 — End: 2017-04-24
  Administered 2017-04-23 – 2017-04-24 (×2): 75 ug via ORAL
  Filled 2017-04-22 (×2): qty 1

## 2017-04-22 MED ORDER — ZINC OXIDE 20 % EX OINT
1.0000 "application " | TOPICAL_OINTMENT | CUTANEOUS | Status: DC
  Filled 2017-04-22: qty 28.35

## 2017-04-22 MED ORDER — LORATADINE 10 MG PO TABS
10.0000 mg | ORAL_TABLET | Freq: Every day | ORAL | Status: DC
  Administered 2017-04-23 – 2017-04-24 (×2): 10 mg via ORAL
  Filled 2017-04-22 (×2): qty 1

## 2017-04-22 MED ORDER — PANTOPRAZOLE SODIUM 40 MG PO TBEC
40.0000 mg | DELAYED_RELEASE_TABLET | Freq: Every day | ORAL | Status: DC
  Administered 2017-04-23 – 2017-04-24 (×2): 40 mg via ORAL
  Filled 2017-04-22 (×2): qty 1

## 2017-04-22 MED ORDER — SODIUM CHLORIDE 0.9% FLUSH
3.0000 mL | Freq: Two times a day (BID) | INTRAVENOUS | Status: DC
  Administered 2017-04-23 – 2017-04-24 (×5): 3 mL via INTRAVENOUS

## 2017-04-22 MED ORDER — ACETAMINOPHEN 325 MG PO TABS: 650.0000 mg | ORAL_TABLET | Freq: Four times a day (QID) | ORAL | Status: DC | PRN

## 2017-04-22 MED ORDER — ENSURE ENLIVE PO LIQD
237.0000 mL | Freq: Two times a day (BID) | ORAL | Status: DC
  Administered 2017-04-23 – 2017-04-24 (×3): 237 mL via ORAL
  Filled 2017-04-22 (×2): qty 237

## 2017-04-22 MED ORDER — SODIUM CHLORIDE 0.9% FLUSH: 3.0000 mL | INTRAVENOUS | Status: DC | PRN

## 2017-04-22 MED ORDER — DIGOXIN 125 MCG PO TABS
125.0000 ug | ORAL_TABLET | ORAL | Status: DC
  Administered 2017-04-23: 125 ug via ORAL
  Filled 2017-04-22 (×3): qty 1

## 2017-04-22 MED ORDER — PSYLLIUM 95 % PO PACK
1.0000 | PACK | Freq: Every day | ORAL | Status: DC | PRN
Start: 2017-04-22 — End: 2017-04-25
  Administered 2017-04-23: 1 via ORAL
  Filled 2017-04-22 (×2): qty 1

## 2017-04-22 MED ORDER — SODIUM CHLORIDE 0.9 % IV SOLN: 250.0000 mL | INTRAVENOUS | Status: DC | PRN

## 2017-04-22 MED ORDER — ONDANSETRON HCL 4 MG PO TABS: 4.0000 mg | ORAL_TABLET | Freq: Four times a day (QID) | ORAL | Status: DC | PRN

## 2017-04-22 MED ORDER — FUROSEMIDE 10 MG/ML IJ SOLN
80.0000 mg | Freq: Two times a day (BID) | INTRAMUSCULAR | Status: DC
  Administered 2017-04-23 – 2017-04-24 (×4): 80 mg via INTRAVENOUS
  Filled 2017-04-22 (×5): qty 8

## 2017-04-22 MED ORDER — POTASSIUM CHLORIDE CRYS ER 20 MEQ PO TBCR
20.0000 meq | EXTENDED_RELEASE_TABLET | Freq: Every day | ORAL | Status: DC
  Administered 2017-04-23 – 2017-04-24 (×2): 20 meq via ORAL
  Filled 2017-04-22 (×2): qty 1

## 2017-04-22 MED ORDER — ACETAMINOPHEN 650 MG RE SUPP
650.0000 mg | Freq: Four times a day (QID) | RECTAL | Status: DC | PRN
  Administered 2017-04-24: 650 mg via RECTAL
  Filled 2017-04-22: qty 1

## 2017-04-22 MED ORDER — ONDANSETRON HCL 4 MG/2ML IJ SOLN
4.0000 mg | Freq: Four times a day (QID) | INTRAMUSCULAR | Status: DC | PRN
  Administered 2017-04-24: 4 mg via INTRAVENOUS
  Filled 2017-04-22: qty 2

## 2017-04-22 NOTE — Telephone Encounter (Signed)
HHn called to report 3 lb weight gain. Patient's family gave extra dose of Lasix. Reports no swelling but some crackling in lungs. Please call with instructions. /t g

## 2017-04-22 NOTE — H&P (Signed)
History and Physical    Jeffrey Mccarty BMW:[id number] DOB: September 24, 1921 DOA: 04/17/2017  PCP: [name], MD   Patient coming from: Home  Chief Complaint: Dyspnea with noted weight gain  HPI: Jeffrey Mccarty is a 84 y.o. male with medical history significant for atrial fibrillation on Eliquis, combined systolic and diastolic CHF with EF 40-45%, hypothyroidism, and chronic Foley with urinary retention who was brought to the emergency department by his daughter on account of the fact that he has had increased weight gain and shortness of breath.  Patient apparently had his Lasix dosage reduced to 40 mg daily by his cardiologist Dr. Wyline Mood recently and on account of the weight gain was told to give 60 mg of Lasix over the weekend with no change in his weight, but rather some increase.  The patient usually wears home oxygen 2 L and is currently on 4 L.  The patient is very hard of hearing and cannot personally give any history, but appears to deny any chest pain and has no lower extremity edema.   ED Course: Vital signs noted to be stable.  Laboratory data indicate stable anemia with hemoglobin 10.8 as well as stable creatinine of 1.6 with baseline 1.5-1.9.  Troponin is 0.06 and BNP is approximately 1600.  Chest x-ray demonstrates moderate CHF findings with small effusions.  EKG demonstrates atrial fibrillation at 73 bpm with some LVH noted.  Review of Systems: Cannot be obtained due to patient condition.  Past Medical History:  Diagnosis Date  . Aortic stenosis    a. moderate AS by echo in 04/2015  . Atrial fibrillation (HCC)    on Eliquis  . Elevated PSA   . Emphysema lung (HCC)   . Hypertension    no longer on medication  . Hypothyroid   . Insomnia   . Kidney stone   . Mitral regurgitation 03/25/2017   moderate  . Polymyalgia rheumatica (HCC)    family is not aware of this diagnosis  . Pressure ulcer   . Reflux     Past Surgical History:  Procedure Laterality Date  .  APPENDECTOMY    . CHOLECYSTECTOMY    . THYROIDECTOMY    . TONSILLECTOMY       reports that he has never smoked. He has never used smokeless tobacco. He reports that he does not drink alcohol or use drugs.  Allergies  Allergen Reactions  . Cardura [Doxazosin Mesylate]     Family History  Problem Relation Age of Onset  . Heart attack Father     Prior to Admission medications   Medication Sig Start Date End Date Taking? Authorizing Provider  Acetaminophen (TYLENOL) 325 MG CAPS Take 1 capsule by mouth daily as needed.   Yes [provider]  DIGOX 125 MCG tablet take 1 tablet by mouth every other day 12/11/16  Yes [name], MD  ELIQUIS 2.5 MG TABS tablet take 1 tablet by mouth twice a day 11/05/16  Yes Branch, Dorothe Pea, MD  feeding supplement, ENSURE COMPLETE, (ENSURE COMPLETE) LIQD Take 237 mLs by mouth 2 (two) times daily between meals. 02/03/13  Yes Leroy Sea, MD  furosemide (LASIX) 40 MG tablet Take 1 tablet (40 mg total) by mouth daily. Patient taking differently: Take 60 mg by mouth daily.  04/16/17 07/15/17 Yes Strader, Lennart Pall, PA-C  levothyroxine (SYNTHROID, LEVOTHROID) 75 MCG tablet Take 1 tablet (75 mcg total) by mouth daily before breakfast. 05/17/16  Yes Nida, Denman George, MD  loratadine (  CLARITIN) 10 MG tablet Take 10 mg by mouth daily.   Yes [provider]  Melatonin 1 MG TABS Take 1 mg by mouth at bedtime.    Yes [provider]  Menthol-Zinc Oxide (CALMOSEPTINE) 0.44-20.625 % OINT Apply 1 application topically 2 (two) times a week. For wound care   Yes [provider]  pantoprazole (PROTONIX) 40 MG tablet Take 1 tablet (40 mg total) by mouth daily. 01/14/17  Yes Setzer, Terri L, NP  potassium chloride SA (K-DUR,KLOR-CON) 20 MEQ tablet Take 20 mEq by mouth daily.   Yes [provider]  psyllium (HYDROCIL/METAMUCIL) 95 % PACK Take 1 packet by mouth daily as needed for mild constipation.   Yes [provider]  triamcinolone ointment (KENALOG) 0.1 % apply to affected area twice a day Patient not taking: Reported on May 18, 2017 12/10/16   [name], MD    Physical Exam: Vitals:   05-18-17 1730 2017-05-18 1730 05-18-17 1731  BP:  (!) 171/93 106/86  Pulse:  69 72  Resp:  20   Temp:  97.9 F (36.6 C) (!) 97.5 F (36.4 C)  TempSrc:  Oral Oral  SpO2: 95% 99% 99%    Constitutional: NAD, calm, comfortable Vitals:   05-18-2017 1730 05/18/2017 1730 05/18/17 1731  BP:  (!) 171/93 106/86  Pulse:  69 72  Resp:  20   Temp:  97.9 F (36.6 C) (!) 97.5 F (36.4 C)  TempSrc:  Oral Oral  SpO2: 95% 99% 99%   Eyes: lids and conjunctivae normal ENMT: Mucous membranes are moist.  Neck: normal, supple Respiratory: clear to auscultation bilaterally. Normal respiratory effort. No accessory muscle use.  On 4 L nasal cannula. Cardiovascular: Regular rate and rhythm, no murmurs. No extremity edema. Abdomen: no tenderness, no distention. Bowel sounds positive.  Musculoskeletal:  No joint deformity upper and lower extremities.   Skin: no rashes, lesions, ulcers.   Labs on Admission: I have personally reviewed following labs and imaging studies  CBC: Recent Labs  Lab 18-May-2017 1824  WBC 6.2  NEUTROABS 3.9  HGB 10.8*  HCT 33.7*  MCV 86.9  PLT 290   Basic Metabolic Panel: Recent Labs  Lab 05-18-2017 1824  NA 135  K 4.0  CL 97*  CO2 28  GLUCOSE 97  BUN 36*  CREATININE 1.61*  CALCIUM 8.7*   GFR: Estimated Creatinine Clearance: 20.4 mL/min (A) (by C-G formula based on SCr of 1.61 mg/dL (H)). Liver Function Tests: Recent Labs  Lab 2017/05/18 1824  AST 30  ALT 19  ALKPHOS 44  BILITOT 0.6  PROT 7.1  ALBUMIN 3.3*   No results for input(s): LIPASE, AMYLASE in the last 168 hours. No results for input(s): AMMONIA in the last 168 hours. Coagulation Profile: No results for input(s): INR, PROTIME in the last 168 hours. Cardiac Enzymes: Recent Labs  Lab 18-May-2017 1824    TROPONINI 0.06*   BNP (last 3 results) No results for input(s): PROBNP in the last 8760 hours. HbA1C: No results for input(s): HGBA1C in the last 72 hours. CBG: No results for input(s): GLUCAP in the last 168 hours. Lipid Profile: No results for input(s): CHOL, HDL, LDLCALC, TRIG, CHOLHDL, LDLDIRECT in the last 72 hours. Thyroid Function Tests: No results for input(s): TSH, T4TOTAL, FREET4, T3FREE, THYROIDAB in the last 72 hours. Anemia Panel: No results for input(s): VITAMINB12, FOLATE, FERRITIN, TIBC, IRON, RETICCTPCT in the last 72 hours. Urine analysis:    Component Value Date/Time   COLORURINE STRAW (A) 03/31/2017  1820   APPEARANCEUR CLEAR 03/31/2017 1820   LABSPEC 1.006 03/31/2017 1820   PHURINE 6.0 03/31/2017 1820   GLUCOSEU NEGATIVE 03/31/2017 1820   HGBUR SMALL (A) 03/31/2017 1820   BILIRUBINUR NEGATIVE 03/31/2017 1820   KETONESUR NEGATIVE 03/31/2017 1820   PROTEINUR NEGATIVE 03/31/2017 1820   UROBILINOGEN 0.2 02/09/2013 0336   NITRITE NEGATIVE 03/31/2017 1820   LEUKOCYTESUR MODERATE (A) 03/31/2017 1820    Radiological Exams on Admission: Dg Chest Portable 1 View  Result Date: 03/29/2017 CLINICAL DATA:  Shortness of Breath EXAM: PORTABLE CHEST 1 VIEW COMPARISON:  03/31/2017 FINDINGS: Cardiomegaly with vascular congestion and diffuse bilateral interstitial and alveolar opacities compatible with edema/CHF. Small effusions. No acute bony abnormality. IMPRESSION: Moderate CHF pattern. Suspect small effusions. Electronically Signed   By: Charlett NoseKevin  Dover M.D.   On: 04/04/2017 18:35    EKG: Independently reviewed. Afib 73bpm with LVH.  Assessment/Plan Principal Problem:   Acute hypoxemic respiratory failure (HCC) Active Problems:   Essential hypertension   Atrial fibrillation, chronic (HCC)   CHF (congestive heart failure) (HCC)   Elevated troponin   Hypothyroidism   Chronic indwelling Foley catheter    1. Acute hypoxemic respiratory failure secondary to volume  overload with combined systolic and diastolic CHF decompensation.  Maintain on diuresis with IV Lasix 80 mg twice daily and monitor daily weights and I's and O's with fluid restriction of 1500 mL daily.  Consult to cardiology as they are familiar with this patient and his diuretic needs in regards to CHF.  Echo has been recently performed on 03/2017 and I will not repeat at this time. 2. Elevated troponin.  Likely secondary to above.  Continue to trend. 3. CKD stage III.  Continue to monitor closely while diuresing.  He appears to be at his baseline currently. 4. Chronic atrial fibrillation.  Maintain on Eliquis for anticoagulation as well as home digoxin. 5. Hypothyroidism.  Maintain on Synthroid.   DVT prophylaxis: Eliquis Code Status: DNR Family Communication: Daughter at bedside Disposition Plan: Plan for diuresis with Lasix with cardiology to further evaluate. Consults called: Cardiology consultation in computer. Admission status: Inpatient, telemetry   Syra Sirmons Hoover BrunetteD Jadarius Commons DO [facility] Pager [phone]  If 7PM-7AM, please contact night-coverage [email or web address] Password [email or web address]  03/29/2017, 8:54 PM

## 2017-04-22 NOTE — ED Notes (Signed)
Date and time results received: 04/27/2017 1922 (use smartphrase ".now" to insert current time)  Test: troponin Critical Value: 0.06  Name of Provider Notified: Dr Estell HarpinZammit  Orders Received? Or Actions Taken?: Actions Taken: no orders received

## 2017-04-22 NOTE — Telephone Encounter (Signed)
Left message to return call with ashley from [facility]

## 2017-04-22 NOTE — ED Triage Notes (Signed)
Patient recently with reduced Lasix, increased SOB, EMS noted clothes pin clipped to nasal cannula onto shirt.  Wheezing noted to upper right quadrant. Indwelling foley noted.

## 2017-04-22 NOTE — ED Provider Notes (Signed)
[facility] EMERGENCY DEPARTMENT Provider Note   CSN: [id number] Arrival date & time: 05/16/17  1745     History   Chief Complaint Chief Complaint  Patient presents with  . Shortness of Breath    HPI Jeffrey Mccarty is a 84 y.o. male.  Patient complains of increasing shortness of breath.  He usually uses 2 L of oxygen and nasally but he now has increased it to 4 L  The history is provided by the patient and a relative. No language interpreter was used.  Shortness of Breath  This is a recurrent problem. The problem occurs continuously.The current episode started more than 2 days ago. The problem has not changed since onset.Pertinent negatives include no fever, no headaches, no cough, no chest pain, no abdominal pain and no rash. It is unknown what precipitated the problem. Risk factors: CHF. Treatments tried: Lasix. He has had prior hospitalizations. He has had prior ED visits. He has had prior ICU admissions.    Past Medical History:  Diagnosis Date  . Aortic stenosis    a. moderate AS by echo in 04/2015  . Atrial fibrillation (HCC)    on Eliquis  . Elevated PSA   . Emphysema lung (HCC)   . Hypertension    no longer on medication  . Hypothyroid   . Insomnia   . Kidney stone   . Mitral regurgitation 03/25/2017   moderate  . Polymyalgia rheumatica (HCC)    family is not aware of this diagnosis  . Pressure ulcer   . Reflux     Patient Active Problem List   Diagnosis Date Noted  . Hypothyroidism 2017-05-16  . Chronic indwelling Foley catheter 05-16-17  . Acute hypoxemic respiratory failure (HCC) May 16, 2017  . Pressure injury of skin 04/01/2017  . Elevated troponin 03/31/2017  . Renal insufficiency 03/31/2017  . UTI (urinary tract infection) 12/31/2016  . Atrial fibrillation, chronic (HCC) 12/31/2016  . Chronic anticoagulation 12/31/2016  . CHF (congestive heart failure) (HCC) 12/31/2016  . Dementia 12/31/2016  . AKI (acute kidney injury) (HCC) 09/24/2016  .  Colitis 09/24/2016  . Gout 09/11/2016  . Hypothyroidism following radioiodine therapy 11/12/2014  . Allergic rhinitis 05/24/2013  . Malnutrition of moderate degree (HCC) 02/03/2013  . Esophageal reflux 02/01/2013  . Rash and nonspecific skin eruption 02/01/2013  . Atrial fibrillation with RVR (HCC) 01/31/2013  . URTI (acute upper respiratory infection) 01/31/2013  . ARF (acute renal failure) (HCC) 01/31/2013  . Essential hypertension 10/05/2008  . BILIARY DYSKINESIA 10/05/2008    Past Surgical History:  Procedure Laterality Date  . APPENDECTOMY    . CHOLECYSTECTOMY    . THYROIDECTOMY    . TONSILLECTOMY          Home Medications    Prior to Admission medications   Medication Sig Start Date End Date Taking? Authorizing Provider  Acetaminophen (TYLENOL) 325 MG CAPS Take 1 capsule by mouth daily as needed.   Yes [provider]  DIGOX 125 MCG tablet take 1 tablet by mouth every other day 12/11/16  Yes [name], MD  ELIQUIS 2.5 MG TABS tablet take 1 tablet by mouth twice a day 11/05/16  Yes Branch, Dorothe Pea, MD  feeding supplement, ENSURE COMPLETE, (ENSURE COMPLETE) LIQD Take 237 mLs by mouth 2 (two) times daily between meals. 02/03/13  Yes Leroy Sea, MD  furosemide (LASIX) 40 MG tablet Take 1 tablet (40 mg total) by mouth daily. Patient taking differently: Take 60 mg by mouth daily.  04/16/17  07/15/17 Yes Strader, GrenadaBrittany M, PA-C  levothyroxine (SYNTHROID, LEVOTHROID) 75 MCG tablet Take 1 tablet (75 mcg total) by mouth daily before breakfast. 05/17/16  Yes Nida, Denman GeorgeGebreselassie W, MD  loratadine (CLARITIN) 10 MG tablet Take 10 mg by mouth daily.   Yes [provider]  Melatonin 1 MG TABS Take 1 mg by mouth at bedtime.    Yes [provider]  Menthol-Zinc Oxide (CALMOSEPTINE) 0.44-20.625 % OINT Apply 1 application topically 2 (two) times a week. For wound care   Yes [provider]  pantoprazole (PROTONIX) 40 MG tablet Take 1 tablet  (40 mg total) by mouth daily. 01/14/17  Yes Setzer, Terri L, NP  potassium chloride SA (K-DUR,KLOR-CON) 20 MEQ tablet Take 20 mEq by mouth daily.   Yes [provider]  psyllium (HYDROCIL/METAMUCIL) 95 % PACK Take 1 packet by mouth daily as needed for mild constipation.   Yes [provider]  triamcinolone ointment (KENALOG) 0.1 % apply to affected area twice a day Patient not taking: Reported on 04/15/2017 12/10/16   Merlyn AlbertLuking, [name], MD    Family History Family History  Problem Relation Age of Onset  . Heart attack Father     Social History Social History   Tobacco Use  . Smoking status: Never Smoker  . Smokeless tobacco: Never Used  Substance Use Topics  . Alcohol use: No    Alcohol/week: 0.0 oz  . Drug use: No     Allergies   Cardura [doxazosin mesylate]   Review of Systems Review of Systems  Constitutional: Negative for appetite change, fatigue and fever.  HENT: Negative for congestion, ear discharge and sinus pressure.   Eyes: Negative for discharge.  Respiratory: Positive for shortness of breath. Negative for cough.   Cardiovascular: Negative for chest pain.  Gastrointestinal: Negative for abdominal pain and diarrhea.  Genitourinary: Negative for frequency and hematuria.  Musculoskeletal: Negative for back pain.  Skin: Negative for rash.  Neurological: Negative for seizures and headaches.  Psychiatric/Behavioral: Negative for hallucinations.     Physical Exam Updated Vital Signs BP 106/86 (BP Location: Right Arm)   Pulse 72   Temp (!) 97.5 F (36.4 C) (Oral)   Resp 20   SpO2 99%   Physical Exam  Constitutional: He is oriented to person, place, and time. He appears well-developed.  HENT:  Head: Normocephalic.  Eyes: Conjunctivae and EOM are normal. No scleral icterus.  Neck: Neck supple. No thyromegaly present.  Cardiovascular: Normal rate and regular rhythm. Exam reveals no gallop and no friction rub.  No murmur  heard. Pulmonary/Chest: No stridor. He has no wheezes. He has no rales. He exhibits no tenderness.  Abdominal: He exhibits no distension. There is no tenderness. There is no rebound.  Musculoskeletal: Normal range of motion. He exhibits no edema.  Lymphadenopathy:    He has no cervical adenopathy.  Neurological: He is oriented to person, place, and time. He exhibits normal muscle tone. Coordination normal.  Skin: No rash noted. No erythema.  Psychiatric: He has a normal mood and affect. His behavior is normal.     ED Treatments / Results  Labs (all labs ordered are listed, but only abnormal results are displayed) Labs Reviewed  CBC WITH DIFFERENTIAL/PLATELET - Abnormal; Notable for the following components:      Result Value   RBC 3.88 (*)    Hemoglobin 10.8 (*)    HCT 33.7 (*)    RDW 16.9 (*)    All other components within normal limits  COMPREHENSIVE METABOLIC PANEL - Abnormal; Notable for the following components:   Chloride 97 (*)    BUN 36 (*)    Creatinine, Ser 1.61 (*)    Calcium 8.7 (*)    Albumin 3.3 (*)    GFR calc non Af Amer 35 (*)    GFR calc Af Amer 41 (*)    All other components within normal limits  BRAIN NATRIURETIC PEPTIDE - Abnormal; Notable for the following components:   B Natriuretic Peptide 1,597.0 (*)    All other components within normal limits  TROPONIN I - Abnormal; Notable for the following components:   Troponin I 0.06 (*)    All other components within normal limits  DIGOXIN LEVEL    EKG EKG Interpretation  Date/Time:  Monday 05-15-17 17:38:17 EDT Ventricular Rate:  73 PR Interval:    QRS Duration: 92 QT Interval:  369 QTC Calculation: 407 R Axis:   -8 Text Interpretation:  Atrial fibrillation Ventricular premature complex LVH with secondary repolarization abnormality Confirmed by Bethann Berkshire [phone]) on May 15, 2017 8:16:43 PM   Radiology Dg Chest Portable 1 View  Result Date: 05/15/2017 CLINICAL DATA:  Shortness of Breath  EXAM: PORTABLE CHEST 1 VIEW COMPARISON:  03/31/2017 FINDINGS: Cardiomegaly with vascular congestion and diffuse bilateral interstitial and alveolar opacities compatible with edema/CHF. Small effusions. No acute bony abnormality. IMPRESSION: Moderate CHF pattern. Suspect small effusions. Electronically Signed   By: Charlett Nose M.D.   On: 15-May-2017 18:35    EKG Interpretation  Date/Time:  Monday May 15, 2017 17:38:17 EDT Ventricular Rate:  73 PR Interval:    QRS Duration: 92 QT Interval:  369 QTC Calculation: 407 R Axis:   -8 Text Interpretation:  Atrial fibrillation Ventricular premature complex LVH with secondary repolarization abnormality Confirmed by Bethann Berkshire [phone]) on 05-15-2017 8:16:43 PM       Procedures Procedures (including critical care time)  Medications Ordered in ED Medications  furosemide (LASIX) injection 80 mg (has no administration in time range)     Initial Impression / Assessment and Plan / ED Course  I have reviewed the triage vital signs and the nursing notes.  Pertinent labs & imaging results that were available during my care of the patient were reviewed by me and considered in my medical decision making (see chart for details).    Patient with worsening congestive heart failure.  Patient will be admitted to medicine to be diuresed  Final Clinical Impressions(s) / ED Diagnoses   Final diagnoses:  SOB (shortness of breath)    ED Discharge Orders    None       Bethann Berkshire, MD May 15, 2017 2100

## 2017-04-22 NOTE — ED Notes (Signed)
Patient leg bag changed to larger foley drainage bag by NT.

## 2017-04-23 ENCOUNTER — Encounter (HOSPITAL_COMMUNITY): Payer: Self-pay | Admitting: *Deleted

## 2017-04-23 DIAGNOSIS — E039 Hypothyroidism, unspecified: Secondary | ICD-10-CM

## 2017-04-23 DIAGNOSIS — I5043 Acute on chronic combined systolic (congestive) and diastolic (congestive) heart failure: Secondary | ICD-10-CM

## 2017-04-23 DIAGNOSIS — I10 Essential (primary) hypertension: Secondary | ICD-10-CM

## 2017-04-23 DIAGNOSIS — I5021 Acute systolic (congestive) heart failure: Secondary | ICD-10-CM

## 2017-04-23 DIAGNOSIS — I350 Nonrheumatic aortic (valve) stenosis: Secondary | ICD-10-CM

## 2017-04-23 DIAGNOSIS — N184 Chronic kidney disease, stage 4 (severe): Secondary | ICD-10-CM

## 2017-04-23 DIAGNOSIS — N183 Chronic kidney disease, stage 3 (moderate): Secondary | ICD-10-CM

## 2017-04-23 HISTORY — DX: Chronic kidney disease, stage 4 (severe): N18.4

## 2017-04-23 LAB — BASIC METABOLIC PANEL
Anion gap: 13 (ref 5–15)
BUN: 32 mg/dL — ABNORMAL HIGH (ref 6–20)
CHLORIDE: 95 mmol/L — AB (ref 101–111)
CO2: 29 mmol/L (ref 22–32)
CREATININE: 1.52 mg/dL — AB (ref 0.61–1.24)
Calcium: 9.1 mg/dL (ref 8.9–10.3)
GFR calc non Af Amer: 38 mL/min — ABNORMAL LOW (ref >60)
GFR, EST AFRICAN AMERICAN: 44 mL/min — AB (ref >60)
GLUCOSE: 104 mg/dL — AB (ref 65–99)
Potassium: 3.8 mmol/L (ref 3.5–5.1)
Sodium: 137 mmol/L (ref 135–145)

## 2017-04-23 LAB — CBC
HCT: 35.3 % — ABNORMAL LOW (ref 39.0–52.0)
HEMOGLOBIN: 11.4 g/dL — AB (ref 13.0–17.0)
MCH: 27.9 pg (ref 26.0–34.0)
MCHC: 32.3 g/dL (ref 30.0–36.0)
MCV: 86.3 fL (ref 78.0–100.0)
Platelets: 300 10*3/uL (ref 150–400)
RBC: 4.09 MIL/uL — AB (ref 4.22–5.81)
RDW: 16.8 % — ABNORMAL HIGH (ref 11.5–15.5)
WBC: 7.9 10*3/uL (ref 4.0–10.5)

## 2017-04-23 LAB — TROPONIN I
Troponin I: 0.06 ng/mL (ref <0.03)
Troponin I: 0.06 ng/mL (ref <0.03)

## 2017-04-23 LAB — GLUCOSE, CAPILLARY: Glucose-Capillary: 149 mg/dL — ABNORMAL HIGH (ref 65–99)

## 2017-04-23 MED ORDER — CYCLOBENZAPRINE HCL 5 MG PO TABS
2.5000 mg | ORAL_TABLET | Freq: Once | ORAL | Status: AC
  Administered 2017-04-24: 2.5 mg via ORAL
  Filled 2017-04-23: qty 0.5

## 2017-04-23 NOTE — ED Notes (Signed)
Date and time results received: 04/23/17 0455   Test: Troponin I Critical Value: 0.06  Name of Provider Notified: Robb Matarrtiz, MD Notified

## 2017-04-23 NOTE — Telephone Encounter (Signed)
ok 

## 2017-04-23 NOTE — Progress Notes (Signed)
Progress Note    Jeffrey Mccarty  ZOX:[id number] DOB: [birth year]  DOA: 04/07/2017 PCP: [name], MD    Brief Narrative:   Chief complaint: Follow-up dyspnea with weight gain.  Medical records reviewed and are as summarized below:  Jeffrey Mccarty is an 84 y.o. male with a PMH of chronic respiratory failure on home oxygen atrial fibrillation on chronic Eliquis, combined systolic/diastolic CHF with EF 40-45%, hypothyroidism, urinary retention with chronic Foley who was admitted 04/17/2017 for evaluation of increasing weight gain and shortness of breath in the setting of a recent dose reduction of his Lasix by his cardiologist.  Evaluation in the ED revealed a BNP level of 1600 and a chest x-ray showing moderate CHF findings with small pleural effusions.  Assessment/Plan:   Principal Problem:   Acute hypoxemic respiratory failure (HCC) secondary to acute on chronic combined CHF Chest x-ray personally reviewed and shows massive cardiomegaly and findings consistent with CHF. Admitted and placed on Lasix 80 mg IV twice daily and a 1500 cc fluid restriction.  2D echo most recently performed on 03/25/17 and showed an EF of 40-45% with diffuse hypokinesis and high ventricular filling pressures.  Diuresed 3 L overnight.  Cardiology consultation pending.  Active Problems:   Essential hypertension Blood pressure mildly soft with diuresis.  Monitor renal function closely.    Atrial fibrillation, chronic (HCC) Fully anticoagulated with Eliquis and rate controlled on digoxin.    Elevated troponin Likely from demand ischemia in the setting of decompensated CHF and atrial fibrillation.  Mild with flat trend.    Hypothyroidism Continue Synthroid.    Chronic indwelling Foley catheter Catheter care per nursing.    Stage IV CKD   Creatinine consistent with usual baseline values.  Monitor closely with aggressive diuresis.   Family Communication/Anticipated D/C date and plan/Code Status    DVT prophylaxis: Eliquis ordered. Code Status: DNR Family Communication: Daughters updated at the bedside. Disposition Plan: Home when fully diuresed.   Medical Consultants:    Cardiology   Anti-Infectives:    None  Subjective:   Patient reports that his shortness of breath is improving. No nausea or vomiting. No reports of chest pain.  Objective:    Vitals:   04/23/17 0500 04/23/17 0530 04/23/17 0600 04/23/17 0835  BP: (!) 83/64 102/66 (!) 99/52 127/87  Pulse: 65 65 65 67  Resp: (!) 27 18 19  (!) 22  Temp:    98 F (36.7 C)  TempSrc:      SpO2: 97% 97% 98% 93%  Weight:    47 kg (103 lb 9.9 oz)  Height:    5' (1.524 m)    Intake/Output Summary (Last 24 hours) at 04/23/2017 1235 Last data filed at 04/23/2017 0840 Gross per 24 hour  Intake 403 ml  Output 3100 ml  Net -2697 ml   Filed Weights   04/23/17 0835  Weight: 47 kg (103 lb 9.9 oz)    Exam: General: No acute distress. Cardiovascular: Heart sounds are irregular with a grade 3/6 systolic ejection murmur. Mild JVD. Lungs: Decreased breath sounds with bibasilar crackles.  Abdomen: Soft, nontender, nondistended with normal active bowel sounds. No masses. No hepatosplenomegaly. Neurological: Alert and oriented 3. Moves all extremities 4 with equal strength. Cranial nerves II through XII grossly intact except for hardness of hearing. Skin: Warm and dry. No rashes or lesions. Extremities: No clubbing or cyanosis. No edema. Pedal pulses 2+. Psychiatric: Mood and affect are normal. Insight and judgment are fair.  Data Reviewed:   I have personally reviewed following labs and imaging studies:  Labs: Labs show the following:   Basic Metabolic Panel: Recent Labs  Lab 04/06/2017 1824 04/23/17 0352  NA 135 137  K 4.0 3.8  CL 97* 95*  CO2 28 29  GLUCOSE 97 104*  BUN 36* 32*  CREATININE 1.61* 1.52*  CALCIUM 8.7* 9.1   GFR Estimated Creatinine Clearance: 20.2 mL/min (A) (by C-G formula based on  SCr of 1.52 mg/dL (H)). Liver Function Tests: Recent Labs  Lab 03/31/2017 1824  AST 30  ALT 19  ALKPHOS 44  BILITOT 0.6  PROT 7.1  ALBUMIN 3.3*   CBC: Recent Labs  Lab 04/12/2017 1824 04/23/17 0352  WBC 6.2 7.9  NEUTROABS 3.9  --   HGB 10.8* 11.4*  HCT 33.7* 35.3*  MCV 86.9 86.3  PLT 290 300   Cardiac Enzymes: Recent Labs  Lab 04/04/2017 1824 04/21/2017 2218 04/23/17 0352 04/23/17 1016  TROPONINI 0.06* 0.07* 0.06* 0.06*   CBG: Recent Labs  Lab 04/23/17 0832  GLUCAP 149*   Microbiology No results found for this or any previous visit (from the past 240 hour(s)).  Procedures and diagnostic studies:  Dg Chest Portable 1 View  Result Date: 04/28/2017 CLINICAL DATA:  Shortness of Breath EXAM: PORTABLE CHEST 1 VIEW COMPARISON:  03/31/2017 FINDINGS: Cardiomegaly with vascular congestion and diffuse bilateral interstitial and alveolar opacities compatible with edema/CHF. Small effusions. No acute bony abnormality. IMPRESSION: Moderate CHF pattern. Suspect small effusions. Electronically Signed   By: Charlett NoseKevin  Dover M.D.   On: 04/13/2017 18:35    Medications:   . apixaban  2.5 mg Oral BID  . digoxin  125 mcg Oral QODAY  . feeding supplement (ENSURE ENLIVE)  237 mL Oral BID BM  . furosemide  80 mg Intravenous Q12H  . levothyroxine  75 mcg Oral QAC breakfast  . loratadine  10 mg Oral Daily  . pantoprazole  40 mg Oral Daily  . potassium chloride SA  20 mEq Oral Daily  . sodium chloride flush  3 mL Intravenous Q12H  . [START ON 2017/07/16] zinc oxide  1 application Topical Once per day on Mon Thu   Continuous Infusions: . sodium chloride       LOS: 1 day   Jeffrey Mccarty  [facility] Pager [phone]. If unable to reach me by pager, please call my cell phone at [phone].  *Please refer to [email or web address], password [email or web address] to get updated schedule on who will round on this patient, as hospitalists switch teams weekly. If 7PM-7AM, please contact night-coverage at  [email or web address], password [email or web address] for any overnight needs.  04/23/2017, 12:35 PM

## 2017-04-23 NOTE — Plan of Care (Signed)
End of shift summary: Patient alert and oriented x 2. His daughters remain at the bedside. Foley care was performed on patient. Patient had one bowel movement this shift. His appetite has been poor and he has been unsteady on his feet. Patient has been voiding appropriately. No signs and symptoms of distress noted.

## 2017-04-23 NOTE — Telephone Encounter (Signed)
[facility],she also states the pt is in the hospital for CHF currently.

## 2017-04-23 NOTE — ED Notes (Signed)
Pt woke up and asked if his daughter could get him some cold Dr. Reino KentPepper to drink. RN advised Pt it is not the best choice of fluids to have and it would be counted towards his daily 1500ml fluid restriction, but if that's what he wants to drink, then that's fine.

## 2017-04-23 NOTE — Telephone Encounter (Signed)
[facility]

## 2017-04-23 NOTE — ED Notes (Signed)
Date and time results received: 04/23/17 2315   Test: Troponin I Critical Value: 0.07  Name of Provider Notified: Robb Matarrtiz, MD Notified

## 2017-04-23 NOTE — Consult Note (Addendum)
Cardiology Consult    Patient ID: [id number]; [id number]; 1921-04-07   Admit date: 2017-05-12 Date of Consult: 04/23/2017  Primary Care Provider: [name], Mccarty Primary Cardiologist: Jeffrey Rich, Mccarty   Patient Profile    Jeffrey Mccarty is a 84 y.o. male with past medical history of chronic combined systolic and diastolic CHF (EF 62-13% by echo in 03/2017), PAF (on Eliquis), moderate AS, Stage 3 CKD and HTN who is being seen today for the evaluation of CHF at the request of Jeffrey Mccarty.   History of Present Illness    Jeffrey Mccarty was admitted earlier this month for a CHF exacerbation and diuresed over -6.7L with weight declining from 109 lbs on admission to 102 lbs at the time of discharge. He was discharged on PO Lasix 40 mg daily with instructions to take 60mg  on MWF.   At the time of his follow-up visit on 04/16/2017, he reported that breathing was at baseline and weight was stable at 104-105 lbs on his home scales. Recent labs had shown his creatinine had increased from 1.53 to 1.94, therefore Lasix was reduced to 40mg  daily with instructions to take an additional 20mg  as needed for weight gain > 3 lbs overnight or > 5 lbs in one week.   In talking with the patient and his daughter today, they report that his weight started to trend upwards last Thursday and they began to give additional 20 mg tablets in addition to his baseline dosing of 40 mg daily. Despite this adjustment, his weight did not improve and home health nursing had noted he had crackles upon examination. They called our office yesterday afternoon to report this increase and by the time the call was returned, his family had already called EMS due to his worsening dyspnea.   They say weight went above 110 lbs on his home scales. He has been following a low-sodium diet. No recent orthopnea, PND, lower extremity edema, chest pain, or palpitations. Has noted intermittent pain along his extremities bilaterally.    Initial labs showed WBC 6.2, Hgb 10.8, and platelets 290. Na+ 135, K+ 4.0, and creatinine improved to 1.61. Initial troponin 0.06 with cyclic values flat at 0.07 and 0.06 (consistent with previous hospitalizations). BNP 1597. Dig Level 1.3. CXR consistent with CHF. EKG shows atrial fibrillation, HR 73, with occasional PVC's.   He has been admitted and started on IV Lasix 80 mg twice daily. Overall recorded output of -2.6 Mccarty thus far. Weight at 103 lbs this AM and he notes improvement in his respiratory status.    Past Medical History:  Diagnosis Date  . Aortic stenosis    a. moderate AS by echo in 04/2015  . Atrial fibrillation (HCC)    on Eliquis  . CKD (chronic kidney disease), stage IV (HCC) 04/23/2017  . Elevated PSA   . Emphysema lung (HCC)   . Hypertension    no longer on medication  . Hypothyroid   . Insomnia   . Kidney stone   . Mitral regurgitation 03/25/2017   moderate  . Polymyalgia rheumatica (HCC)    family is not aware of this diagnosis  . Pressure ulcer   . Reflux     Past Surgical History:  Procedure Laterality Date  . APPENDECTOMY    . CHOLECYSTECTOMY    . THYROIDECTOMY    . TONSILLECTOMY       Home Medications:  Prior to Admission medications   Medication Sig Start Date End Date Taking?  Authorizing Provider  Acetaminophen (TYLENOL) 325 MG CAPS Take 1 capsule by mouth daily as needed.   Yes Jeffrey Mccarty  DIGOX 125 MCG tablet take 1 tablet by mouth every other day 12/11/16  Yes Jeffrey Mccarty  ELIQUIS 2.5 MG TABS tablet take 1 tablet by mouth twice a day 11/05/16  Yes Branch, Jeffrey Pea, Mccarty  feeding supplement, ENSURE COMPLETE, (ENSURE COMPLETE) LIQD Take 237 mLs by mouth 2 (two) times daily between meals. 02/03/13  Yes Leroy Sea, Mccarty  furosemide (LASIX) 40 MG tablet Take 1 tablet (40 mg total) by mouth daily. Patient taking differently: Take 60 mg by mouth daily.  04/16/17 07/15/17 Yes Jeffrey Mccarty, Jeffrey Pall, PA-C  levothyroxine  (SYNTHROID, LEVOTHROID) 75 MCG tablet Take 1 tablet (75 mcg total) by mouth daily before breakfast. 05/17/16  Yes Jeffrey Mccarty, Jeffrey George, Mccarty  loratadine (CLARITIN) 10 MG tablet Take 10 mg by mouth daily.   Yes Jeffrey Mccarty  Melatonin 1 MG TABS Take 1 mg by mouth at bedtime.    Yes Jeffrey Mccarty  Menthol-Zinc Oxide (CALMOSEPTINE) 0.44-20.625 % OINT Apply 1 application topically 2 (two) times a week. For wound care   Yes Jeffrey Mccarty  pantoprazole (PROTONIX) 40 MG tablet Take 1 tablet (40 mg total) by mouth daily. 01/14/17  Yes Setzer, Jeffrey L, NP  potassium chloride SA (K-DUR,KLOR-CON) 20 MEQ tablet Take 20 mEq by mouth daily.   Yes Jeffrey Mccarty  psyllium (HYDROCIL/METAMUCIL) 95 % PACK Take 1 packet by mouth daily as needed for mild constipation.   Yes Jeffrey Mccarty  triamcinolone ointment (KENALOG) 0.1 % apply to affected area twice a day Patient not taking: Reported on 2017-05-15 12/10/16   Jeffrey Mccarty    Inpatient Medications: Scheduled Meds: . apixaban  2.5 mg Oral BID  . digoxin  125 mcg Oral QODAY  . feeding supplement (ENSURE ENLIVE)  237 mL Oral BID BM  . furosemide  80 mg Intravenous Q12H  . levothyroxine  75 mcg Oral QAC breakfast  . loratadine  10 mg Oral Daily  . pantoprazole  40 mg Oral Daily  . potassium chloride SA  20 mEq Oral Daily  . sodium chloride flush  3 mL Intravenous Q12H  . [START ON 04/16/2017] zinc oxide  1 application Topical Once per day on Mon Thu   Continuous Infusions: . sodium chloride     PRN Meds: sodium chloride, acetaminophen **OR** acetaminophen, ondansetron **OR** ondansetron (ZOFRAN) IV, psyllium, sodium chloride flush  Allergies:    Allergies  Allergen Reactions  . Cardura [Doxazosin Mesylate]     Social History:   Social History   Socioeconomic History  . Marital status: Widowed    Spouse name: Not on file  . Number of children: Not on file  . Years of education: Not on  file  . Highest education level: Not on file  Occupational History  . Not on file  Social Needs  . Financial resource strain: Not on file  . Food insecurity:    Worry: Not on file    Inability: Not on file  . Transportation needs:    Medical: Not on file    Non-medical: Not on file  Tobacco Use  . Smoking status: Never Smoker  . Smokeless tobacco: Never Used  Substance and Sexual Activity  . Alcohol use: No    Alcohol/week: 0.0 oz  . Drug use: No  . Sexual activity: Not on file  Lifestyle  . Physical  activity:    Days per week: Not on file    Minutes per session: Not on file  . Stress: Not on file  Relationships  . Social connections:    Talks on phone: Not on file    Gets together: Not on file    Attends religious service: Not on file    Active member of club or organization: Not on file    Attends meetings of clubs or organizations: Not on file    Relationship status: Not on file  . Intimate partner violence:    Fear of current or ex partner: Not on file    Emotionally abused: Not on file    Physically abused: Not on file    Forced sexual activity: Not on file  Other Topics Concern  . Not on file  Social History Narrative  . Not on file     Family History:    Family History  Problem Relation Age of Onset  . Heart attack Father       Review of Systems    General:  No chills, fever, or night sweats. Positive for weight gain.  Cardiovascular:  No chest pain, edema, orthopnea, palpitations, paroxysmal nocturnal dyspnea. Positive for dyspnea on exertion.  Dermatological: No rash, lesions/masses Respiratory: No cough, dyspnea Urologic: No hematuria, dysuria Abdominal:   No nausea, vomiting, diarrhea, bright red blood per rectum, melena, or hematemesis Neurologic:  No visual changes, wkns, changes in mental status. All other systems reviewed and are otherwise negative except as noted above.  Physical Exam/Data    Vitals:   04/23/17 0500 04/23/17 0530  04/23/17 0600 04/23/17 0835  BP: (!) 83/64 102/66 (!) 99/52 127/87  Pulse: 65 65 65 67  Resp: (!) 27 18 19  (!) 22  Temp:    98 F (36.7 C)  TempSrc:      SpO2: 97% 97% 98% 93%  Weight:    103 lb 9.9 oz (47 kg)  Height:    5' (1.524 m)    Intake/Output Summary (Last 24 hours) at 04/23/2017 1224 Last data filed at 04/23/2017 0840 Gross per 24 hour  Intake 403 ml  Output 3100 ml  Net -2697 ml   Filed Weights   04/23/17 0835  Weight: 103 lb 9.9 oz (47 kg)   Body mass index is 20.24 kg/m.   General: Pleasant elderly Caucasian male appearing in NAD Psych: Normal affect. Neuro: Alert and oriented X 3. Moves all extremities spontaneously. HEENT: Normal  Neck: Supple without bruits. JVD at 9cm. Lungs:  Resp regular and unlabored, mild rales along bases bilaterally. Heart: Irregularly irregular no s3, s4, 3/6 SEM along RUSB.  Abdomen: Soft, non-tender, non-distended, BS + x 4.  Extremities: No clubbing, cyanosis or lower extremity edema. DP/PT/Radials 2+ and equal bilaterally.   EKG:  The EKG was personally reviewed and demonstrates: Atrial fibrillation, HR 73, with occasional PVC's.   Telemetry:  Telemetry was personally reviewed and demonstrates: Atrial fibrillation, HR in 60's to 80's with frequent PVC's.    Labs/Studies     Relevant CV Studies:  Echocardiogram: 03/2017 Study Conclusions  - Left ventricle: The cavity size was normal. Wall thickness was   increased in a pattern of mild LVH. Systolic function was mildly   to moderately reduced. The estimated ejection fraction was in the   range of 40% to 45%. Diffuse hypokinesis. The study was not   technically sufficient to allow evaluation of LV diastolic   dysfunction due to atrial fibrillation. Doppler parameters are  consistent with high ventricular filling pressure. - Aortic valve: Moderately to severely calcified annulus.   Trileaflet; moderately calcified leaflets. There was mild to   moderate stenosis. Peak  velocity (S): 279 cm/s. Mean gradient   (S): 18 mm Hg. Valve area (VTI): 1.09 cm^2. Valve area (Vmax):   1.14 cm^2. Valve area (Vmean): 1.15 cm^2. - Mitral valve: Mildly thickened leaflets . There was moderate   regurgitation. - Left atrium: The atrium was severely dilated. - Right ventricle: Systolic function was moderately reduced. - Right atrium: The atrium was moderately dilated. - Tricuspid valve: There was moderate regurgitation. There were two   distinct jets noted. - Pulmonary arteries: PA peak pressure: 38 mm Hg (S).  Laboratory Data:  Chemistry Recent Labs  Lab 03/30/2017 1824 04/23/17 0352  NA 135 137  K 4.0 3.8  CL 97* 95*  CO2 28 29  GLUCOSE 97 104*  BUN 36* 32*  CREATININE 1.61* 1.52*  CALCIUM 8.7* 9.1  GFRNONAA 35* 38*  GFRAA 41* 44*  ANIONGAP 10 13    Recent Labs  Lab 04/11/2017 1824  PROT 7.1  ALBUMIN 3.3*  AST 30  ALT 19  ALKPHOS 44  BILITOT 0.6   Hematology Recent Labs  Lab 04/09/2017 1824 04/23/17 0352  WBC 6.2 7.9  RBC 3.88* 4.09*  HGB 10.8* 11.4*  HCT 33.7* 35.3*  MCV 86.9 86.3  MCH 27.8 27.9  MCHC 32.0 32.3  RDW 16.9* 16.8*  PLT 290 300   Cardiac Enzymes Recent Labs  Lab 04/19/2017 1824 04/21/2017 2218 04/23/17 0352 04/23/17 1016  TROPONINI 0.06* 0.07* 0.06* 0.06*   No results for input(s): TROPIPOC in the last 168 hours.  BNP Recent Labs  Lab 04/14/2017 1829  BNP 1,597.0*    DDimer No results for input(s): DDIMER in the last 168 hours.  Radiology/Studies:  Dg Chest Portable 1 View  Result Date: 04/15/2017 CLINICAL DATA:  Shortness of Breath EXAM: PORTABLE CHEST 1 VIEW COMPARISON:  03/31/2017 FINDINGS: Cardiomegaly with vascular congestion and diffuse bilateral interstitial and alveolar opacities compatible with edema/CHF. Small effusions. No acute bony abnormality. IMPRESSION: Moderate CHF pattern. Suspect small effusions. Electronically Signed   By: Charlett Nose M.D.   On: 04/17/2017 18:35    Assessment & Plan    1. Acute  on Chronic Combined Systolic and Diastolic CHF - the patient has a known reduced EF of 40-45% by echo in 03/2017.  Was recently hospitalized for CHF exacerbation and PO Lasix was further titrated at that time, however his kidney function acutely worsened with creatinine increasing from 1.53 to 1.94 with this dose adjustment. -  In talking with family, they noticed over a 5 lb weight gain on his home scales and gave additional Lasix with no improvement in his weight or dyspnea. BNP on admission elevated to 1597 and CXR consistent with CHF.  - has been started on IV Lasix 80 mg twice daily with an overall recorded output of -2.6 Mccarty thus far. Weight at 103 lbs this AM and he notes improvement in his respiratory status. Would continue with IV Lasix as he still has rales on examination. May need to consider transition to Torsemide as an outpatient for improved bioavailability. He is followed by [facility] and can also consider the use of IV Lasix as an outpatient for acute weight changes to help avoid repeat hospitalizations.   2. Persistent Atrial Fibrillation - Heart rate remains well controlled in the 70's to 80's on telemetry. Remains on Digoxin 125 mcg every other  day. If kidney function remains variable, may need to look at discontinuing.  - He denies any evidence of active bleeding. Remains on Eliquis for anticoagulation.  3. Moderate AS - continue to follow as an outpatient. The patient and his family have been clear in stating they would not want any type of surgical intervention.   4. Stage 3 CKD - baseline creatinine 1.5 - 1.6. Peaked at 1.94 in the outpatient setting, trending down to 1.52 this AM.    For questions or updates, please contact [facility] Please consult [email or web address] for contact info under Cardiology/STEMI.  Signed, Ellsworth LennoxBrittany M Strader, PA-C 04/23/2017, 12:24 PM Pager: [phone]  The patient was seen and examined, and I agree with the history, physical exam,  assessment and plan as documented above, with modifications as noted below. I have also personally reviewed all relevant documentation, old records, labs, and both radiographic and cardiovascular studies. I have also independently interpreted old and new ECG's.  84 yr old male with aforementioned cardiac history (chronic combined systolic and diastolic CHF, paroxysmal atrial fibrillation, moderate aortic stenosis) as well as CKD stage III admitted with acute decompensation. He was recently hospitalized for the same with an adjustment in diuretics as noted above.  He saw B. Strader PA-C for a follow up visit on 04/16/17 at which time he was doing well and weight appeared to be stable. Lasix was reduced to 40 mg daily due to creatinine elevation of 1.94 (had been 1.53).  He has had a gradual progression of symptoms (weight gain and shortness of breath) since late last week as per daughter in room. He was given extra 20 mg of Lasix without a change in symptoms and weight.  Relevant labs, ECG, and chest xray reviewed above.  He is on Lasix IV 80 mg bid and he has had symptomatic improvement with associated weight reduction. Creatinine 1.52 today (1.61 yesterday). Troponins are nonspecifically elevated. He will need close monitoring of renal function. If it continues to fluctuate I would consider stopping digoxin, but will defer to his primary cardiologist. I agree that he may require torsemide at the time of discharge for improved bioavailability.     Prentice DockerSuresh Eduardo Honor, Mccarty, [facility] HeadFACC  04/23/2017 4:44 PM

## 2017-04-23 NOTE — Telephone Encounter (Signed)
Please see below.

## 2017-04-23 NOTE — Progress Notes (Signed)
Donnamarie PoagK. Kirby, NP paged to see if muscle relaxer could be order to help pt rest tonight. Per Daughter, pt had to have it ordered last admission (March 7th). Waiting for orders/call back.

## 2017-04-24 ENCOUNTER — Inpatient Hospital Stay (HOSPITAL_COMMUNITY): Payer: Medicare HMO

## 2017-04-24 ENCOUNTER — Other Ambulatory Visit: Payer: Self-pay

## 2017-04-24 DIAGNOSIS — I5043 Acute on chronic combined systolic (congestive) and diastolic (congestive) heart failure: Secondary | ICD-10-CM

## 2017-04-24 DIAGNOSIS — Z9289 Personal history of other medical treatment: Secondary | ICD-10-CM

## 2017-04-24 DIAGNOSIS — I482 Chronic atrial fibrillation: Secondary | ICD-10-CM

## 2017-04-24 DIAGNOSIS — R748 Abnormal levels of other serum enzymes: Secondary | ICD-10-CM

## 2017-04-24 DIAGNOSIS — J9601 Acute respiratory failure with hypoxia: Secondary | ICD-10-CM

## 2017-04-24 LAB — URINALYSIS, ROUTINE W REFLEX MICROSCOPIC
Bacteria, UA: NONE SEEN
Bilirubin Urine: NEGATIVE
Glucose, UA: NEGATIVE mg/dL
Ketones, ur: NEGATIVE mg/dL
NITRITE: NEGATIVE
PH: 5 (ref 5.0–8.0)
Protein, ur: NEGATIVE mg/dL
Specific Gravity, Urine: 1.01 (ref 1.005–1.030)

## 2017-04-24 LAB — BASIC METABOLIC PANEL
ANION GAP: 15 (ref 5–15)
BUN: 40 mg/dL — ABNORMAL HIGH (ref 6–20)
CO2: 30 mmol/L (ref 22–32)
Calcium: 9.5 mg/dL (ref 8.9–10.3)
Chloride: 92 mmol/L — ABNORMAL LOW (ref 101–111)
Creatinine, Ser: 1.64 mg/dL — ABNORMAL HIGH (ref 0.61–1.24)
GFR calc Af Amer: 40 mL/min — ABNORMAL LOW (ref >60)
GFR, EST NON AFRICAN AMERICAN: 34 mL/min — AB (ref >60)
GLUCOSE: 129 mg/dL — AB (ref 65–99)
POTASSIUM: 3 mmol/L — AB (ref 3.5–5.1)
SODIUM: 137 mmol/L (ref 135–145)

## 2017-04-24 LAB — GLUCOSE, CAPILLARY
GLUCOSE-CAPILLARY: 135 mg/dL — AB (ref 65–99)
Glucose-Capillary: 222 mg/dL — ABNORMAL HIGH (ref 65–99)

## 2017-04-24 MED ORDER — TORSEMIDE 20 MG PO TABS
20.0000 mg | ORAL_TABLET | Freq: Once | ORAL | Status: DC
Start: 2017-04-24 — End: 2017-04-24
  Filled 2017-04-24: qty 1

## 2017-04-24 MED ORDER — OXYCODONE HCL 5 MG PO TABS
2.5000 mg | ORAL_TABLET | Freq: Four times a day (QID) | ORAL | Status: DC | PRN
  Administered 2017-04-24: 2.5 mg via ORAL
  Filled 2017-04-24: qty 1

## 2017-04-24 MED ORDER — ACETAMINOPHEN 500 MG PO TABS
1000.0000 mg | ORAL_TABLET | Freq: Once | ORAL | Status: AC
  Administered 2017-04-24: 1000 mg via ORAL
  Filled 2017-04-24: qty 2

## 2017-04-24 MED ORDER — SENNOSIDES-DOCUSATE SODIUM 8.6-50 MG PO TABS
1.0000 | ORAL_TABLET | Freq: Two times a day (BID) | ORAL | Status: DC
  Administered 2017-04-24: 1 via ORAL
  Filled 2017-04-24: qty 1

## 2017-04-24 MED ORDER — OXYCODONE HCL 5 MG PO TABS
2.5000 mg | ORAL_TABLET | Freq: Once | ORAL | Status: AC
  Administered 2017-04-24: 2.5 mg via ORAL
  Filled 2017-04-24: qty 1

## 2017-04-24 MED ORDER — PROMETHAZINE HCL 25 MG/ML IJ SOLN
12.5000 mg | Freq: Once | INTRAMUSCULAR | Status: AC
  Administered 2017-04-24: 12.5 mg via INTRAVENOUS
  Filled 2017-04-24: qty 1

## 2017-04-24 MED ORDER — MORPHINE SULFATE (PF) 2 MG/ML IV SOLN: 1.0000 mg | INTRAVENOUS | Status: DC | PRN

## 2017-04-24 MED ORDER — FUROSEMIDE 10 MG/ML IJ SOLN
20.0000 mg | Freq: Once | INTRAMUSCULAR | Status: AC
  Administered 2017-04-24: 20 mg via INTRAVENOUS
  Filled 2017-04-24: qty 2

## 2017-04-24 MED ORDER — ONDANSETRON HCL 4 MG/2ML IJ SOLN
4.0000 mg | Freq: Once | INTRAMUSCULAR | Status: AC
  Administered 2017-04-24: 4 mg via INTRAVENOUS
  Filled 2017-04-24: qty 2

## 2017-04-24 MED ORDER — POTASSIUM CHLORIDE CRYS ER 20 MEQ PO TBCR: 20.0000 meq | EXTENDED_RELEASE_TABLET | Freq: Two times a day (BID) | ORAL | Status: DC

## 2017-04-24 MED ORDER — FENTANYL CITRATE (PF) 100 MCG/2ML IJ SOLN
25.0000 ug | Freq: Once | INTRAMUSCULAR | Status: AC
  Administered 2017-04-24: 25 ug via INTRAVENOUS
  Filled 2017-04-24: qty 2

## 2017-04-24 NOTE — Progress Notes (Signed)
Pt fell while walking in room. Hit his head, has gash in head. Dr. Robb Matarrtiz paged and made aware that pt may need staples as well as being on Eliquis and needing a head CT. VSS. Pt has no c/o pain. CN and AC in room. Waiting for orders/call back.

## 2017-04-24 NOTE — Progress Notes (Signed)
Patient o2 was 69 on 2L, put pt on 5L only came up to 79, switched pt to 15L nonrebreather and o2 has came up to 90. Notified Mid-level and respiratory. Will continue to monitor patient.

## 2017-04-24 NOTE — Progress Notes (Signed)
Night shift floor coverage note.  The patient was seen due to having a fall sustaining a laceration on his left temporoparietal area. He did not loose consciousness.  He was helped back to bed and wound clean with betadine. Below are his most recent vital signs: 98.1 F (36.7 C)  80  -  22Abnormal   108/58Abnormal   Lying  92 %  Nasal Cannula   He has hearing impairment, but is answering questions. Head shows a 3 cm wound on his left parietotemporal area. Lungs are clear. CV: S1S2, irregularly irregular with a rate in the 80s, 2/6 SM, no edema. Abdomen: Soft, NT Ext.No cyanosis  Neuro: Grossly non-focal. Moves all extremities   CT HEAD WITHOUT CONTRAST  CLINICAL DATA:  84 y/o M; fall with head injury. Small left-sided scalp laceration.  TECHNIQUE: Contiguous axial images were obtained from the base of the skull through the vertex without intravenous contrast.  COMPARISON:  12/31/2016 CT head.  FINDINGS: Brain: No evidence of acute infarction, hemorrhage, hydrocephalus, extra-axial collection or mass lesion/mass effect. Stable advanced chronic microvascular ischemic changes and parenchymal volume loss of the brain.  Vascular: Calcific atherosclerosis carotid siphons.  Skull: Normal. Negative for fracture or focal lesion.  Sinuses/Orbits: No acute finding.  Other: Bilateral intra-ocular lens replacement.  IMPRESSION: 1. No acute intracranial abnormality identified. 2. Stable advanced chronic microvascular ischemic changes and parenchymal volume loss of the brain.   Electronically Signed   By: Mitzi HansenLance  Furusawa-Stratton M.D.   On: 04/24/2017 04:45  ------------------------------------------------------------------------------------------- A/P   Fall. Scalp contusion on left temporo-parietal area with laceration.   CT shows no bleed. Family has been informed by the staff. Scalp wound received three staples under aseptic conditions. Will have sitter at  bedside for safety.  Sanda Kleinavid Elana Jian, MD  This document was prepared using dragon voice recognition software and may contain some unintended errors.

## 2017-04-24 NOTE — Progress Notes (Addendum)
Dr. Robb Matarrtiz came up to see pt in room. New order for CT scan STAT. Pt taken to CT via bed with 2 RNs, Gerri SporeAllison Laranda Burkemper and Morgan StanleyCecily Alston. Pt alert and oriented x2-pt continuing to follow commands. This RN spoke to [name] pts daughter and adv her that her father had fallen and hit his head. Pts daughter stated, 'I knew that was probably going to happen. I knew I shouldn't have left. He gets confused at night." Pt very HOH and acts as if he still does not hear you when you are yelling at him. Dr. Robb Matarrtiz at bedside after CT scan- 3 staples placed.

## 2017-04-24 NOTE — Progress Notes (Signed)
Pt continuously trying to get OOB. Pt was given Flexeril 2.5mg  per MD order x1 d/t daughter stating it helps him sleep. When RN and NT went into room pt had taken telemetry off, was walking around in room and did not want to get back in bed. Pt stated he called the police on staff and they would be here any minute.  Rn and NT safely put pt back to bed. Will continue to monitor

## 2017-04-24 NOTE — Progress Notes (Signed)
   04/24/17 0352  What Happened  Was fall witnessed? No  Was patient injured? Yes  Patient found on floor  Found by Staff-comment  Stated prior activity other (comment) (pt got OOB)  Follow Up  MD notified Dr. Robb Matarrtiz  Time MD notified [phone]  Family notified  (Daughter [name] called, no answer)  Time family notified 0350  Progress note created (see row info) Yes  Adult Fall Risk Assessment  Risk Factor Category (scoring not indicated) Fall has occurred during this admission (document High fall risk)  Patient's Fall Risk High Fall Risk (>13 points)  Adult Fall Risk Interventions  Required Bundle Interventions *See Row Information* High fall risk - low, moderate, and high requirements implemented  Additional Interventions Use of appropriate toileting equipment (bedpan, BSC, etc.)  Screening for Fall Injury Risk (To be completed on HIGH fall risk patients) - Assessing Need for Low Bed  Risk For Fall Injury- Low Bed Criteria 85 years or older  Will Implement Low Bed and Floor Mats Yes

## 2017-04-24 NOTE — Progress Notes (Signed)
[facility] PROGRESS NOTE  Jeffrey Mccarty OZD:[id number] DOB: 1921-11-02 DOA: 04/09/2017 PCP: [name], MD  Brief summary   84 y.o. male with a PMH of chronic respiratory failure on home oxygen atrial fibrillation on chronic Eliquis, combined systolic/diastolic CHF with EF 40-45%, hypothyroidism, urinary retention with chronic Foley who was admitted 03/29/2017 for evaluation of increasing weight gain and shortness of breath in the setting of a recent dose reduction of his Lasix by his cardiologist.  Evaluation in the ED revealed a BNP level of 1600 and a chest x-ray showing moderate CHF findings with small pleural effusions.  Assessment/Plan:  Acute hypoxemic respiratory failure (HCC) secondary to acute on chronic combined CHF Chest x-ray personally reviewed and shows massive cardiomegaly and findings consistent with CHF. Admitted and placed on Lasix 80 mg IV twice daily and a 1500 cc fluid restriction.  2D echo most recently performed on 03/25/17 and showed an EF of 40-45% with diffuse hypokinesis and high ventricular filling pressures.  cont diuresis per cardiology, appreciate the input. Monitor I/o, daily weight. -3L  Essential hypertension Blood pressure mildly soft with diuresis.  Monitor renal function closely.  Atrial fibrillation, chronic (HCC) Fully anticoagulated with Eliquis and rate controlled on digoxin.  Elevated troponin Likely from demand ischemia in the setting of decompensated CHF and atrial fibrillation.  Mild with flat trend.  Hypothyroidism Continue Synthroid.  Chronic indwelling Foley catheter Catheter care per nursing.   Stage IV CKD  Creatinine consistent with usual baseline values.  Monitor closely with aggressive diuresis.  Fall. Mechanical/confusion. Head CT: no acute bleeding. Will obtain spine x ray due to reported pain.neuro exam is non focal. D/w patient, his family. Will try low dose oxy for pain control. Also check UA r/o UTI. Check KUB due  to episode of vomiting. Cont fall precautions, sitter as needed.     Code Status: DNR Family Communication: d/w patient, his family. RN (indicate person spoken with, relationship, and if by phone, the number) Disposition Plan: pend clinical improvement    Consultants:  Cardiology   Procedures:  Echo   Antibiotics: Anti-infectives (From admission, onward)   None        (indicate start date, and stop date if known)  HPI/Subjective: Patient had mechanical fall at night, hitting his head. Head CT: no acute bleeding. He was somewhat confused agitated in AM. He complained of lower back pain. Neuro exam is non focal. D/w his family, will obtain x ray spine due to fall. Will also check UA r/o UTI.   Objective: Vitals:   04/24/17 0515 04/24/17 0620  BP: (!) 96/53 112/69  Pulse: 61 61  Resp: 18 20  Temp: 98.7 F (37.1 C) 98.2 F (36.8 C)  SpO2: 93% 92%    Intake/Output Summary (Last 24 hours) at 04/24/2017 0927 Last data filed at 04/24/2017 0431 Gross per 24 hour  Intake 240 ml  Output 2000 ml  Net -1760 ml   Filed Weights   04/23/17 0835 04/24/17 0500  Weight: 47 kg (103 lb 9.9 oz) 46.7 kg (102 lb 15.3 oz)    Exam:   General:  No distress.   Cardiovascular: s1,s2 rrr  Respiratory: no wheezing   Abdomen: soft, nt  Musculoskeletal: no leg edema    Data Reviewed: Basic Metabolic Panel: Recent Labs  Lab 03/31/2017 1824 04/23/17 0352 04/24/17 0550  NA 135 137 137  K 4.0 3.8 3.0*  CL 97* 95* 92*  CO2 28 29 30   GLUCOSE 97 104* 129*  BUN  36* 32* 40*  CREATININE 1.61* 1.52* 1.64*  CALCIUM 8.7* 9.1 9.5   Liver Function Tests: Recent Labs  Lab 09/02/17 1824  AST 30  ALT 19  ALKPHOS 44  BILITOT 0.6  PROT 7.1  ALBUMIN 3.3*   No results for input(s): LIPASE, AMYLASE in the last 168 hours. No results for input(s): AMMONIA in the last 168 hours. CBC: Recent Labs  Lab 09/02/17 1824 04/23/17 0352  WBC 6.2 7.9  NEUTROABS 3.9  --   HGB 10.8* 11.4*   HCT 33.7* 35.3*  MCV 86.9 86.3  PLT 290 300   Cardiac Enzymes: Recent Labs  Lab 09/02/17 1824 09/02/17 2218 04/23/17 0352 04/23/17 1016  TROPONINI 0.06* 0.07* 0.06* 0.06*   BNP (last 3 results) Recent Labs    03/25/17 1137 03/31/17 1558 09/02/17 1829  BNP 1,144.0* 2,093.0* 1,597.0*    ProBNP (last 3 results) No results for input(s): PROBNP in the last 8760 hours.  CBG: Recent Labs  Lab 04/23/17 0832 04/24/17 0714  GLUCAP 149* 135*    No results found for this or any previous visit (from the past 240 hour(s)).   Studies: Ct Head Wo Contrast  Result Date: 04/24/2017 CLINICAL DATA:  84 y/o M; fall with head injury. Small left-sided scalp laceration. EXAM: CT HEAD WITHOUT CONTRAST TECHNIQUE: Contiguous axial images were obtained from the base of the skull through the vertex without intravenous contrast. COMPARISON:  12/31/2016 CT head. FINDINGS: Brain: No evidence of acute infarction, hemorrhage, hydrocephalus, extra-axial collection or mass lesion/mass effect. Stable advanced chronic microvascular ischemic changes and parenchymal volume loss of the brain. Vascular: Calcific atherosclerosis carotid siphons. Skull: Normal. Negative for fracture or focal lesion. Sinuses/Orbits: No acute finding. Other: Bilateral intra-ocular lens replacement. IMPRESSION: 1. No acute intracranial abnormality identified. 2. Stable advanced chronic microvascular ischemic changes and parenchymal volume loss of the brain. Electronically Signed   By: Mitzi HansenLance  Furusawa-Stratton M.D.   On: 04/24/2017 04:45   Dg Chest Portable 1 View  Result Date: Nov 13, 2017 CLINICAL DATA:  Shortness of Breath EXAM: PORTABLE CHEST 1 VIEW COMPARISON:  03/31/2017 FINDINGS: Cardiomegaly with vascular congestion and diffuse bilateral interstitial and alveolar opacities compatible with edema/CHF. Small effusions. No acute bony abnormality. IMPRESSION: Moderate CHF pattern. Suspect small effusions. Electronically Signed   By:  Charlett NoseKevin  Dover M.D.   On: 0Oct 16, 2019 18:35    Scheduled Meds: . apixaban  2.5 mg Oral BID  . digoxin  125 mcg Oral QODAY  . feeding supplement (ENSURE ENLIVE)  237 mL Oral BID BM  . furosemide  80 mg Intravenous Q12H  . levothyroxine  75 mcg Oral QAC breakfast  . loratadine  10 mg Oral Daily  . pantoprazole  40 mg Oral Daily  . potassium chloride SA  20 mEq Oral Daily  . senna-docusate  1 tablet Oral BID  . sodium chloride flush  3 mL Intravenous Q12H  . [START ON 04/20/2017] zinc oxide  1 application Topical Once per day on Mon Thu   Continuous Infusions: . sodium chloride      Principal Problem:   Acute hypoxemic respiratory failure (HCC) Active Problems:   Essential hypertension   Atrial fibrillation, chronic (HCC)   CHF (congestive heart failure) (HCC)   Elevated troponin   Hypothyroidism   Chronic indwelling Foley catheter   CKD (chronic kidney disease), stage IV (HCC)    Time spent: >35 minutes     [name], Carlita Whitcomb N  [facility] Pager [phone]. If 7PM-7AM, please contact night-coverage at [email or web address], password [email or web address] 04/24/2017,  9:27 AM  LOS: 2 days

## 2017-04-24 NOTE — Progress Notes (Addendum)
Progress Note  Patient Name: Jeffrey Mccarty Date of Encounter: 04/24/2017  Primary Cardiologist: Dina Rich, MD   Subjective   Confused overnight and fell in the room. Head CT performed due to him being on Eliquis and showed no acute intracranial abnormalities. Received staples due to laceration.   Patient very anxious this morning and somewhat confused. Says his middle and and lower back is hurting. Breathing now more labored secondary to pain.   Inpatient Medications    Scheduled Meds: . apixaban  2.5 mg Oral BID  . digoxin  125 mcg Oral QODAY  . feeding supplement (ENSURE ENLIVE)  237 mL Oral BID BM  . furosemide  80 mg Intravenous Q12H  . levothyroxine  75 mcg Oral QAC breakfast  . loratadine  10 mg Oral Daily  . pantoprazole  40 mg Oral Daily  . potassium chloride SA  20 mEq Oral Daily  . sodium chloride flush  3 mL Intravenous Q12H  . [START ON 04/03/2017] zinc oxide  1 application Topical Once per day on Mon Thu   Continuous Infusions: . sodium chloride     PRN Meds: sodium chloride, acetaminophen **OR** acetaminophen, ondansetron **OR** ondansetron (ZOFRAN) IV, psyllium, sodium chloride flush   Vital Signs    Vitals:   04/24/17 0450 04/24/17 0500 04/24/17 0515 04/24/17 0620  BP: (!) 100/55  (!) 96/53 112/69  Pulse: 68  61 61  Resp:   18 20  Temp: 98.3 F (36.8 C)  98.7 F (37.1 C) 98.2 F (36.8 C)  TempSrc: Oral  Oral Oral  SpO2: 93%  93% 92%  Weight:  102 lb 15.3 oz (46.7 kg)    Height:        Intake/Output Summary (Last 24 hours) at 04/24/2017 0805 Last data filed at 04/24/2017 0431 Gross per 24 hour  Intake 240 ml  Output 2000 ml  Net -1760 ml   Filed Weights   04/23/17 0835 04/24/17 0500  Weight: 103 lb 9.9 oz (47 kg) 102 lb 15.3 oz (46.7 kg)    Telemetry    Atrial fibrillation, HR in 70's to low-100's.  - Personally Reviewed  ECG    No new tracings.   Physical Exam   General: Well developed, elderly male appearing in no acute  distress. Head: Normocephalic, atraumatic.  Neck: Supple without bruits, JVD not elevated. Lungs:  Resp regular and unlabored, decreased breath sounds along bases. Heart: Irregularly irregular, S1, S2, no S3, S4, no rub. 3/6 SEM along RUSB.  Abdomen: Soft, non-tender, non-distended with normoactive bowel sounds. No hepatomegaly. No rebound/guarding. No obvious abdominal masses. Extremities: No clubbing, cyanosis, or lower extremity edema. Distal pedal pulses are 2+ bilaterally. Neuro: Alert and oriented X 3. Moves all extremities spontaneously. Psych: Normal affect.  Labs    Chemistry Recent Labs  Lab 2017-05-05 1824 04/23/17 0352 04/24/17 0550  NA 135 137 137  K 4.0 3.8 3.0*  CL 97* 95* 92*  CO2 28 29 30   GLUCOSE 97 104* 129*  BUN 36* 32* 40*  CREATININE 1.61* 1.52* 1.64*  CALCIUM 8.7* 9.1 9.5  PROT 7.1  --   --   ALBUMIN 3.3*  --   --   AST 30  --   --   ALT 19  --   --   ALKPHOS 44  --   --   BILITOT 0.6  --   --   GFRNONAA 35* 38* 34*  GFRAA 41* 44* 40*  ANIONGAP 10 13 15  Hematology Recent Labs  Lab 12/01/17 1824 04/23/17 0352  WBC 6.2 7.9  RBC 3.88* 4.09*  HGB 10.8* 11.4*  HCT 33.7* 35.3*  MCV 86.9 86.3  MCH 27.8 27.9  MCHC 32.0 32.3  RDW 16.9* 16.8*  PLT 290 300    Cardiac Enzymes Recent Labs  Lab 12/01/17 1824 12/01/17 2218 04/23/17 0352 04/23/17 1016  TROPONINI 0.06* 0.07* 0.06* 0.06*   No results for input(s): TROPIPOC in the last 168 hours.   BNP Recent Labs  Lab 12/01/17 1829  BNP 1,597.0*     DDimer No results for input(s): DDIMER in the last 168 hours.   Radiology    Ct Head Wo Contrast  Result Date: 04/24/2017 CLINICAL DATA:  84 y/o M; fall with head injury. Small left-sided scalp laceration. EXAM: CT HEAD WITHOUT CONTRAST TECHNIQUE: Contiguous axial images were obtained from the base of the skull through the vertex without intravenous contrast. COMPARISON:  12/31/2016 CT head. FINDINGS: Brain: No evidence of acute  infarction, hemorrhage, hydrocephalus, extra-axial collection or mass lesion/mass effect. Stable advanced chronic microvascular ischemic changes and parenchymal volume loss of the brain. Vascular: Calcific atherosclerosis carotid siphons. Skull: Normal. Negative for fracture or focal lesion. Sinuses/Orbits: No acute finding. Other: Bilateral intra-ocular lens replacement. IMPRESSION: 1. No acute intracranial abnormality identified. 2. Stable advanced chronic microvascular ischemic changes and parenchymal volume loss of the brain. Electronically Signed   By: Mitzi HansenLance  Furusawa-Stratton M.D.   On: 04/24/2017 04:45   Dg Chest Portable 1 View  Result Date: August 02, 2017 CLINICAL DATA:  Shortness of Breath EXAM: PORTABLE CHEST 1 VIEW COMPARISON:  03/31/2017 FINDINGS: Cardiomegaly with vascular congestion and diffuse bilateral interstitial and alveolar opacities compatible with edema/CHF. Small effusions. No acute bony abnormality. IMPRESSION: Moderate CHF pattern. Suspect small effusions. Electronically Signed   By: Charlett NoseKevin  Dover M.D.   On: 0July 05, 2019 18:35    Cardiac Studies    Echocardiogram: 03/25/2017 Study Conclusions  - Left ventricle: The cavity size was normal. Wall thickness was   increased in a pattern of mild LVH. Systolic function was mildly   to moderately reduced. The estimated ejection fraction was in the   range of 40% to 45%. Diffuse hypokinesis. The study was not   technically sufficient to allow evaluation of LV diastolic   dysfunction due to atrial fibrillation. Doppler parameters are   consistent with high ventricular filling pressure. - Aortic valve: Moderately to severely calcified annulus.   Trileaflet; moderately calcified leaflets. There was mild to   moderate stenosis. Peak velocity (S): 279 cm/s. Mean gradient   (S): 18 mm Hg. Valve area (VTI): 1.09 cm^2. Valve area (Vmax):   1.14 cm^2. Valve area (Vmean): 1.15 cm^2. - Mitral valve: Mildly thickened leaflets . There was  moderate   regurgitation. - Left atrium: The atrium was severely dilated. - Right ventricle: Systolic function was moderately reduced. - Right atrium: The atrium was moderately dilated. - Tricuspid valve: There was moderate regurgitation. There were two   distinct jets noted. - Pulmonary arteries: PA peak pressure: 38 mm Hg (S).  Patient Profile     84 y.o. male with past medical history of chroniccombined systolic anddiastolic CHF(EF 40-45% by echo in 03/2017), PAF (on Eliquis), moderate AS, Stage 3 CKD and HTN who presented to [facility] ED on August 02, 2017 for worsening dyspnea and weight gain despite increasing Lasix dosing. Cardiology consulted to assist with volume management.   Assessment & Plan    1. Acute on Chronic Combined Systolic and Diastolic CHF - recently hospitalized  for a CHF exacerbation and PO Lasix was further titrated at that time, however his kidney function acutely worsened with creatinine increasing from 1.53 to 1.94 with this dose adjustment. - Family noticed over a 5 lb weight gain on his home scales and gave additional Lasix over 2-3 days with no improvement in his weight or dyspnea. BNP on admission elevated to 1597 and CXR consistent with CHF.  - receiving IV Lasix 80 mg twice daily with an overall recorded output of -4.8 L thus far. Weight at 102 lbs this AM (baseline weight at time of recent hospital discharge). Would consider transition to PO Torsemide for improved bioavailability as he did not respond well to increased Lasix dosing at home. May benefit from PRN IV Lasix via Home Health to avoid repeat hospitalizations.   2. Persistent Atrial Fibrillation - Heart rate overall well-controlled. Remains on Digoxin 125 mcg every other day. If kidney function remains variable, may need to look at discontinuing.  - Continue Eliquis for anticoagulation.  3. Moderate AS - continue to follow as an outpatient. No plans for surgical intervention as the patient and his  family prefer conservative management.  4. Stage 3 CKD - baseline creatinine 1.5 - 1.6. Peaked at 1.94 in the outpatient setting. Stable at 1.64 this morning.   5. Mechanical Fall/Confusion - fell overnight and received staples due to a laceration along his head. Head CT showing no acute intracranial abnormalities. Reports pain along his back (Imaging pending). UA also pending. Family is very concerned about his care and wish for him to return home as soon as possible. They voiced concerns about call lights not being answered appropriately. I notified the patient's nurse of these concerns and asked if it was possible for a patient care ambassador to come talk to the patient and his family.    For questions or updates, please contact [facility] Please consult [email or web address] for contact info under Cardiology/STEMI.   Lorri Frederick , PA-C 8:05 AM 04/24/2017 Pager: [phone]  Attending note Patient seen and discussed with PA Iran Ouch, I agree with her documentation above. 84 yo male with chronic combined systolic/diastolic HF LVEF 09-81%, afib, moderate AS. Over the last few months has had recurrent issues with CHF. Seen in cardiology clinic 04/16/17 and was doing well with weights stable around 104-105 at home. Cr had elevated to 1.94, and lasix was lowered to 40mg  daily with instructions to take additional 20mg  as needed (had been on lasix 60mg  MWF and 40mg  TThSaSu. He has been on an atypical regimen for his afib given his intolerance to av nodal agents, and has done well on digoxin 0.125mg  every other day. Dig levels have never been elevated on prior checks, and thus I have continued this regimen.  Overnight had fall. CT head no acute process. Further workup per primary team  Negative 4.8 liters this admission, mild variations in renal function. He has been on lasix IV 80mg  bid. Appears nearing euvolemia. We will d/c IV lasix, dose oral torsemide 20mg  this evening to monitor  his response.   Agree with transition to torsemie, would take 20mg  daily and ok to take additional 20mg  as needed for weight gain above 3 lbs at discharge.   Dina Rich MD

## 2017-04-24 NOTE — Progress Notes (Signed)
Patient has gradually become much less responsive and has been transitioned to 15 L nonrebreather mask due to worsening hypoxemia.  He is noted to have agonal respirations and had received some oral oxycodone as well as fentanyl push this afternoon.  He appears to have been subsequently delirious throughout this afternoon and evening according to patient's daughter at the bedside.  I discussed with her that I feel that much of this may be related to narcotics building up in his system causing hypercapnia and somnolence along with the hypoxemia.  I discussed reversing narcotics with Narcan as well as use of BiPAP, but daughter would not like these measures taken at this time and would like for patient to be comfortable.  I agree, and feel that he may be in the active dying process.  We will place on comfort measures and discontinue diuretics and add IV pain medications as needed.  Palliative care consultation for a.m.

## 2017-04-24 NOTE — Progress Notes (Signed)
Patient sustained mechanical fall last night. Head ct was negative for acute bleeding. However, he complained of back pains. X ray  Imaging was obtain. Them MRI spine showed- Acute compression fracture at T6 with loss of height of 60%. Minimal posterior bowing of the posterior margin of the vertebral body but without significant encroachment upon the spinal canal. Acute compression fracture of L2 with loss of height of 10%. No retropulsed bone or canal compromise. Old healed fracture at L1. Neuro exam remains non focal. D/w patient, his family at length regarding pain control, anxiety, delirium, fall precautions. Will try pain control, antiemetics as needed.  May require small dose of benzodiazepine for anxiety. KUB showed no obstruction and urinalysis was unremarkable. Prognosis is guarded in elderly patient with heart failure, renal insufficiency, fall risk, delirium. Cont close monitor, fall precautions, as needed sitter at the bedside due to delirium, fall risk. May need low dose seroquel at night.  [name], [name]

## 2017-04-27 ENCOUNTER — Other Ambulatory Visit: Payer: Self-pay | Admitting: Cardiology

## 2017-04-29 NOTE — Progress Notes (Signed)
Patient family concerned about chest x-ray results and about patients condition. I paged MD P. Sherryll BurgerShah about patient decline in condition and about family's concerns and wanting to know results for chest x-ray and about the next steps for treatment.

## 2017-04-29 NOTE — Progress Notes (Signed)
Patient passed away at 0640 04/14/2017. Daughter Elvina SidleDonna C. Was at bedside along with sitter Jonny RuizJohn. MD notified and death certificate was signed after speaking with medical examiner. I [place] [facility] [place] Doylene BodeGina Lurz she states that since CT of head was negative due to fall that happened on 04/24/2017 at 0400 and hospitalist agreed  To sign death certificate that she has agreed to release patient to hospitalist.

## 2017-04-29 NOTE — Progress Notes (Signed)
Pt cleaned and bagged per protocol. [employer] arrived on unit to transfer body to funeral home. Family at bedside and aware of plan. Emotional support given to family.

## 2017-04-29 NOTE — Progress Notes (Signed)
End of shift Summary: Patient confused and c/o back pain, family at bedside. MD called and coming to see patient and family, orders were given. Patient vomited after eating breakfast, medicine given and MD informed with orders given. Arrangements made for patient to move closer to the nursing station and to have a sitter. Patient vomited after attempting to eat lunch and continues to be restless, family requesting something to help him rest, MD informed and orders given. Patient family concerned about patient's lung sounds, after auscultating MD informed of results and temp., medication and orders given. During shift report patient noted to have a decreased O2 sat,  nonrebreather placed and saturation came up to 90, awaiting chest x-ray results. Family remains at bedside.

## 2017-04-29 NOTE — Progress Notes (Signed)
Pt previously cleansed for patient viewing by family. Body prepared at this time for pickup per protocol. Family no longer at bedside. [facility] home arrived to room 315 at 1030AM to pickup patient. Death certificate given to funeral home workers.

## 2017-04-29 DEATH — deceased

## 2017-05-08 ENCOUNTER — Ambulatory Visit: Payer: Medicare HMO | Admitting: Family Medicine

## 2017-05-15 ENCOUNTER — Ambulatory Visit: Payer: Medicare HMO | Admitting: Cardiology

## 2017-05-29 NOTE — Death Summary Note (Signed)
Death Summary  Marlis EdelsonRoy C Gotts ZHY:[id number] DOB: 11/17/1921 DOA: 01-12-18  PCP: Merlyn AlbertLuking, [name], MD PCP/Office notified:   Admit date: 01-12-18 Date of Death: 0640 AM. 04/20/2017.     Final Diagnoses:  Principal Problem:   Acute hypoxemic respiratory failure (HCC) Active Problems:   Essential hypertension   Atrial fibrillation, chronic (HCC)   CHF (congestive heart failure) (HCC)   Elevated troponin   Hypothyroidism   Chronic indwelling Foley catheter   CKD (chronic kidney disease), stage IV (HCC)   Acute on chronic combined systolic and diastolic CHF (congestive heart failure) (HCC)      History of present illness:   84 y.o.malewith a PMH of chronic respiratory failure on home oxygen atrial fibrillation on chronic Eliquis, combined systolic/diastolic CHF with EF 40-45%, hypothyroidism, urinary retention with chronic Foley who was admitted October 19, 2017 for evaluation of increasing weight gain and shortness of breath in the setting of a recent dose reduction of his Lasix by his cardiologist. Evaluation in the ED revealed a BNP level of 1600 and a chest x-ray showing moderate CHF findings with small pleural effusions.   Hospital Course:   Acute hypoxemic respiratory failure (HCC)secondary to acute on chronic combined CHF. possible aspiration due to vomiting. Chest x-ray personally reviewed and shows massive cardiomegaly and findings consistent with CHF.Admitted and placed on Lasix 80 mg IV twice dailyand a 1500 cc fluid restriction. 2D echo most recently performed on 03/25/17 and showed an EF of 40-45% with diffuse hypokinesis and high ventricular filling pressures. patient continued to iv diuresis.  -Patient sustained mechanical fall on 3/26. Head ct was negative for acute bleeding. However, he complained of back pains. X ray  Imaging was obtain. Then MRI spine showed- Acute compression fracture at T6 with loss of height of 60%. Minimal posterior bowing of the posterior margin  of the vertebral body but without significant encroachment upon the spinal canal. Acute compression fracture of L2 with loss of height of 10%. No retropulsed bone or canal compromise. Old healed fracture at L1. Neuro exam remains non focal. I have d/w patient, his family at length regarding pain control, anxiety, delirium, fall precautions. Patient complained of severe pain, distress. They have agreed for continued pain control with low dose oxy at 2.5 mg due to uncontrolled pain. Unfortunately he vomited after eating food and became more dyspneic, hypoxic. suspect possible aspiration on top of ongoing heart failure with pulmonary edema. Prognosis was guarded in elderly patient with heart failure, renal insufficiency, hypoxia, vomiting, risk of aspiration, fall risk, delirium. D/w patient, confirmed with family. Patient was DNR. He died on 0640 AM. 04/27/2017 under comfort care Cause of death suspect respiratory failure due to pulmonary edema, possible aspiration due to vomiting, fluid overload in the setting of ongoing renal failure. Cannot rule out myocardial infarction due to elevated troponins.   Atrial fibrillation, chronic (HCC)  Elevated troponin  Hypothyroidism  Chronic indwelling Foley catheter  Stage IV CKD  Fall. Mechanical/confusion. Head CT: no acute bleeding.      Time: 0640 AM. 03/30/2017.   SignedEsperanza Sheets:  Ece Cumberland N  [facility] 05/08/2017, 3:48 PM

## 2017-06-11 ENCOUNTER — Other Ambulatory Visit: Payer: Self-pay | Admitting: Family Medicine

## 2017-06-11 ENCOUNTER — Other Ambulatory Visit: Payer: Self-pay | Admitting: "Endocrinology

## 2017-07-16 ENCOUNTER — Ambulatory Visit: Payer: Medicare HMO | Admitting: Family Medicine

## 2017-09-10 ENCOUNTER — Ambulatory Visit (INDEPENDENT_AMBULATORY_CARE_PROVIDER_SITE_OTHER): Payer: Medicare HMO | Admitting: Internal Medicine

## 2018-10-12 IMAGING — RF DG SWALLOWING FUNCTION - NRPT [facility]
1 series · 1 of 1 positions shown · non-contrast
Comparison: none

[Series 1: cp_standard · 0.25mm/px · 1 of 1 slices shown]
[im 1/1]
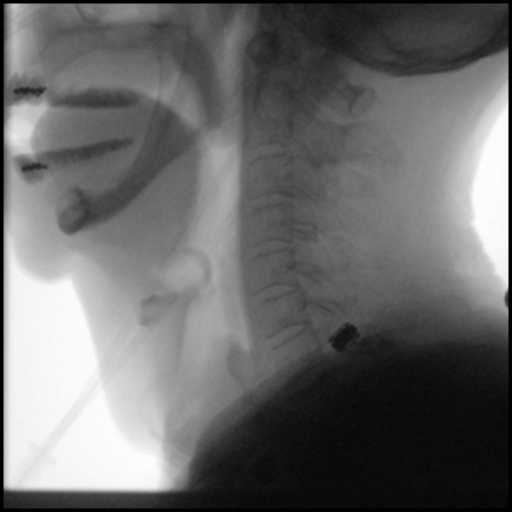

[1 of 1 positions shown; findings below may reference images not displayed]

FLUOROSCOPY FOR SWALLOWING FUNCTION STUDY:
Fluoroscopy was provided for swallowing function study, which was administered by a speech pathologist.  Final results and recommendations from this study are contained within the speech pathology report.

## 2018-11-01 IMAGING — CR DG CHEST 1V PORT
1 series · 1 of 1 positions shown · non-contrast
Comparison: 03/31/2017

CLINICAL DATA: Shortness of Breath

EXAM:
PORTABLE CHEST 1 VIEW

[portable]
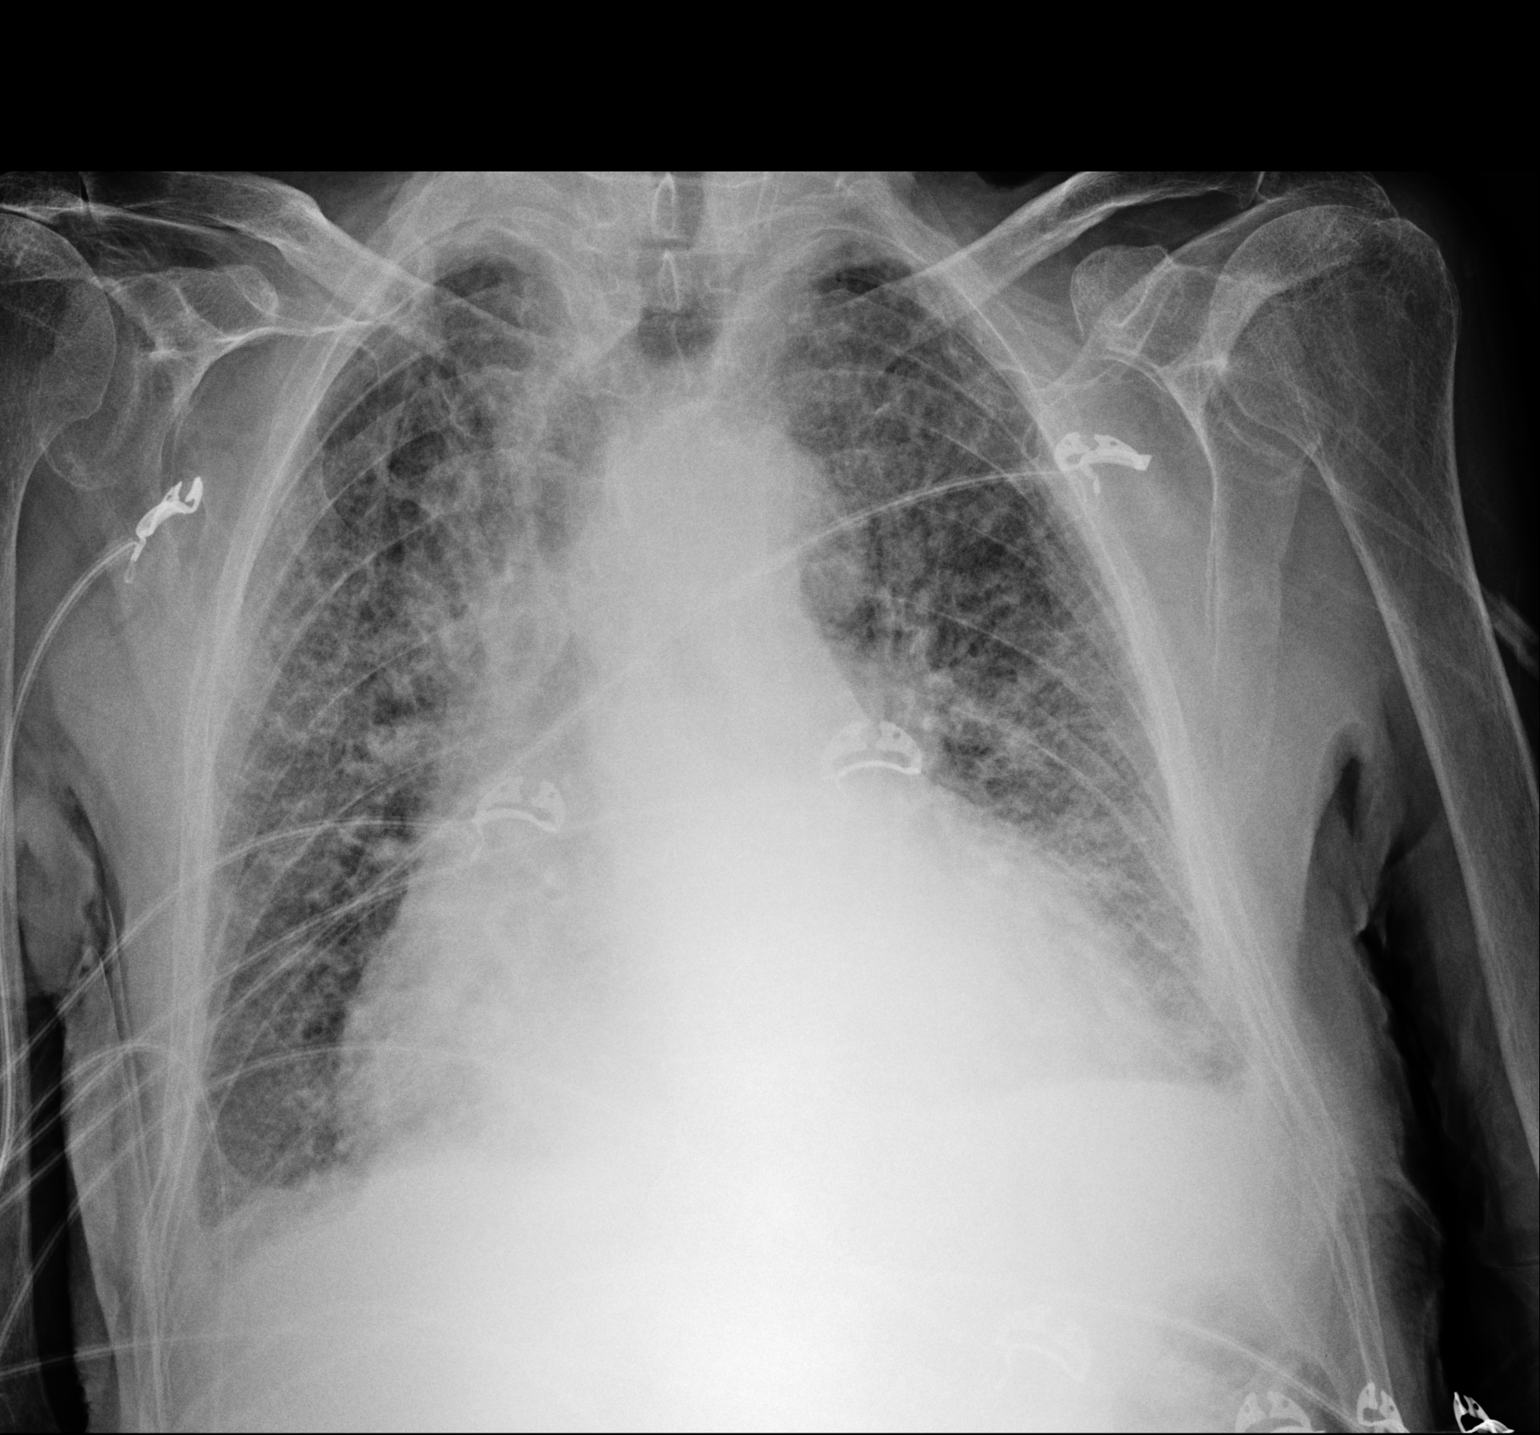

[1 of 1 positions shown; findings below may reference images not displayed]

FINDINGS: Cardiomegaly with vascular congestion and diffuse bilateral
interstitial and alveolar opacities compatible with edema/CHF. Small
effusions. No acute bony abnormality.
IMPRESSION: Moderate CHF pattern.

Suspect small effusions.

## 2018-11-03 IMAGING — CR DG CHEST 1V PORT
1 series · 1 of 1 positions shown · non-contrast
Comparison: 04/22/2017

CLINICAL DATA: Increasing shortness of breath, history of recent
fall with known acute compression fractures

EXAM:
PORTABLE CHEST 1 VIEW

[portable]
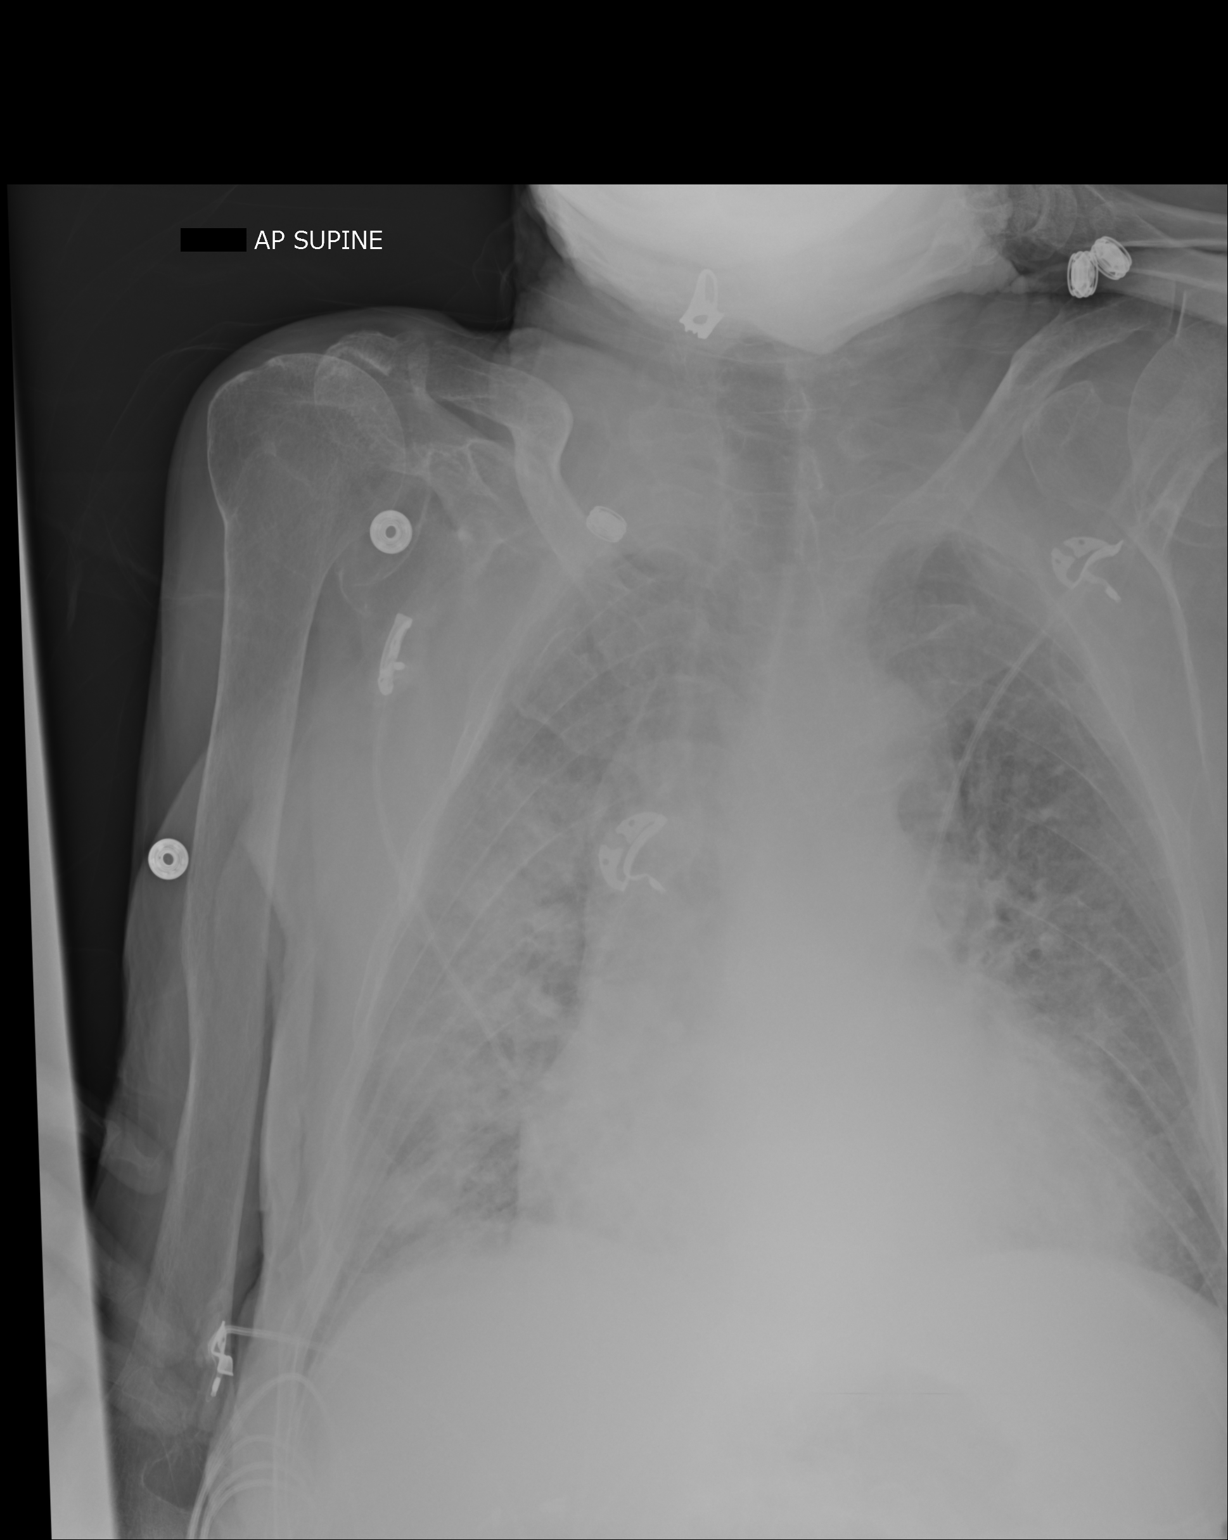

[1 of 1 positions shown; findings below may reference images not displayed]

FINDINGS: Cardiac shadow is again enlarged but stable. Vascular congestion is
again noted consistent with CHF. The degree of parenchymal edema has
increased somewhat particularly on the right when compared with the
prior exam. No sizable effusion is seen. No acute bony abnormality
is noted on this study.
IMPRESSION: Increase in degree of parenchymal edema.

## 2018-11-28 IMAGING — US US ABDOMEN LIMITED
1 series · 14 of 25 positions shown · non-contrast
Comparison: CT abdomen and pelvis 12/31/2016

CLINICAL DATA: Abnormal LFTs

EXAM:
ULTRASOUND ABDOMEN LIMITED RIGHT UPPER QUADRANT

[Series 1: us abdomen limited · 0.17mm/px · 14 of 31 slices shown]
[im 1/31]
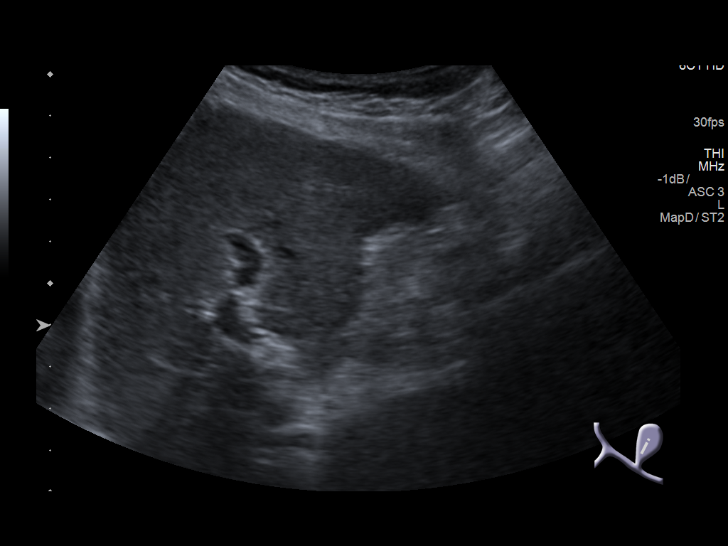
[im 3/31]
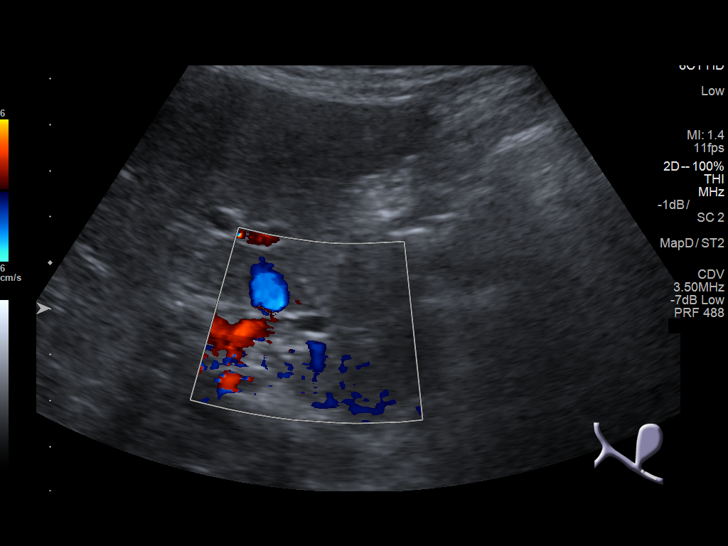
[im 6/31]
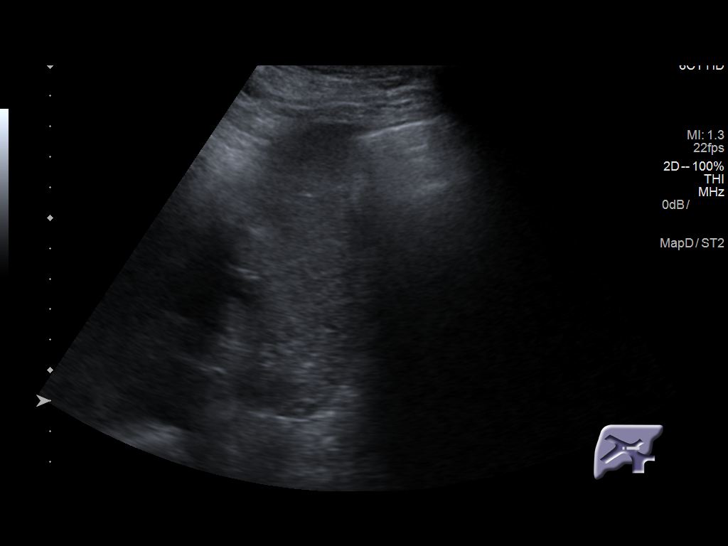
[im 8/31]
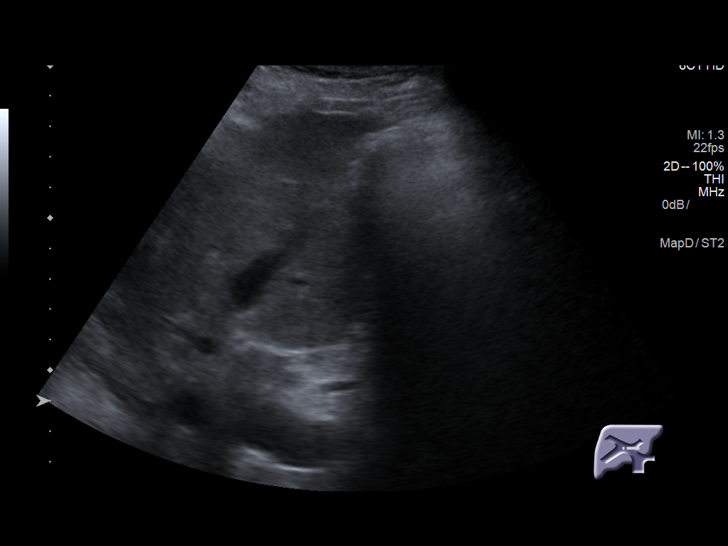
[im 11/31]
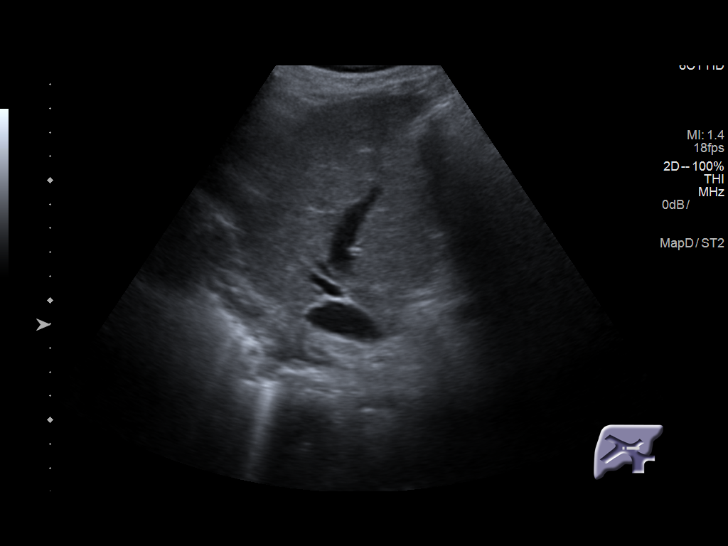
[im 12/31]
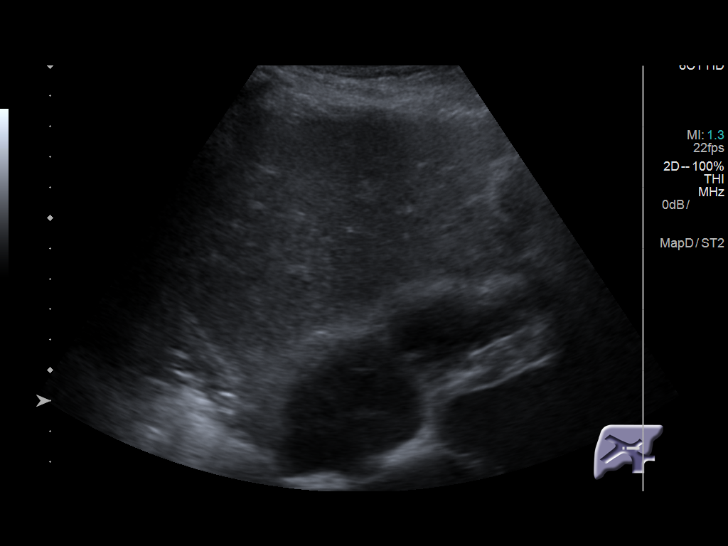
[im 14/31]
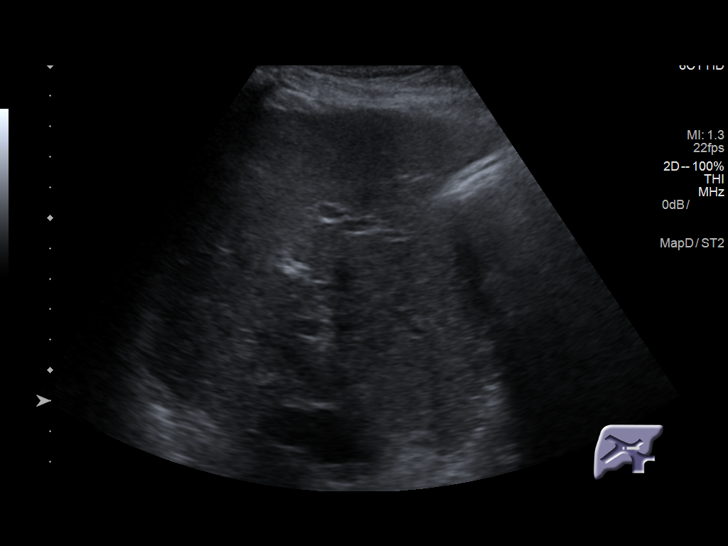
[im 17/31]
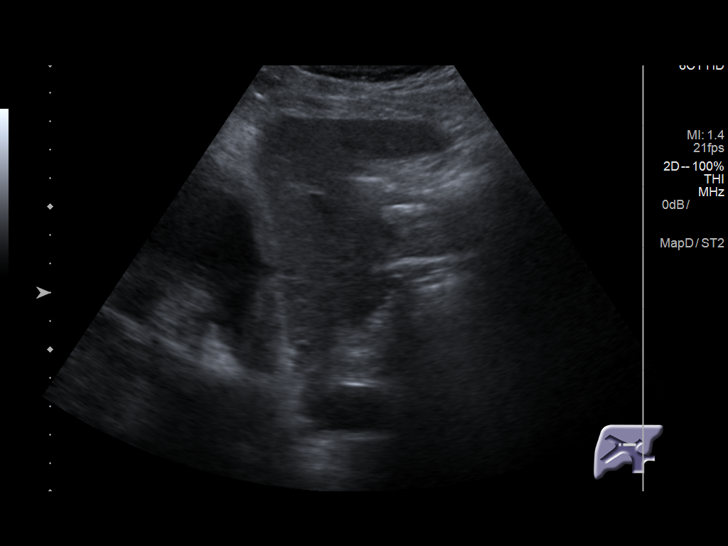
[im 19/31]
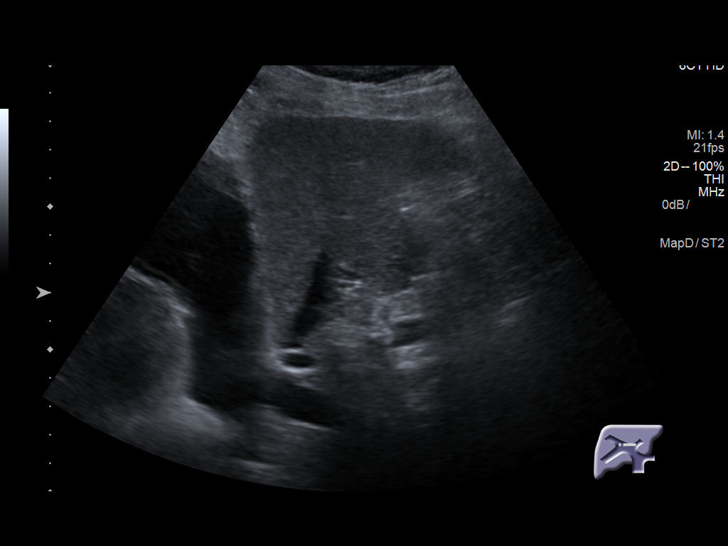
[im 21/31]
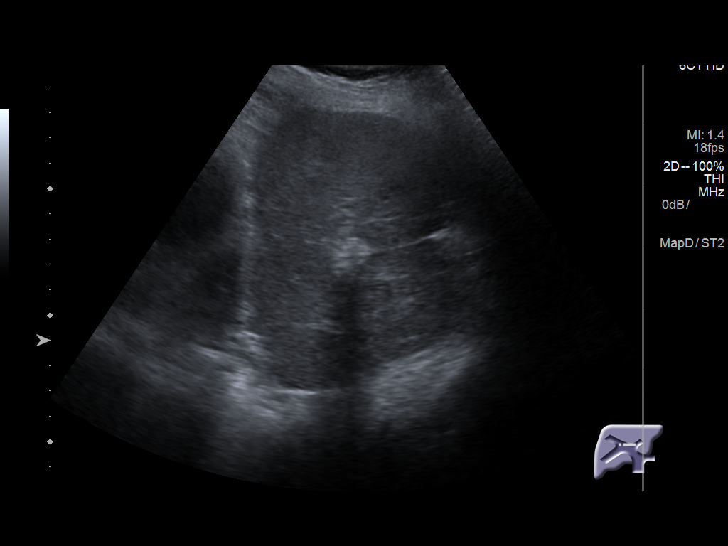
[im 23/31]
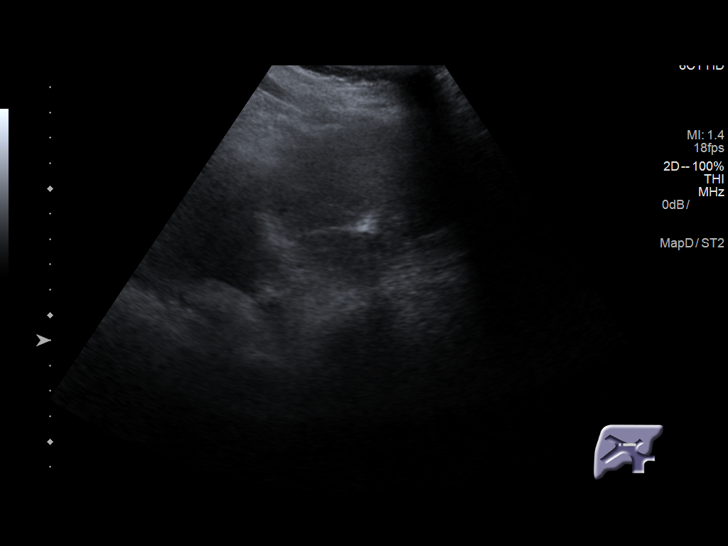
[im 26/31]
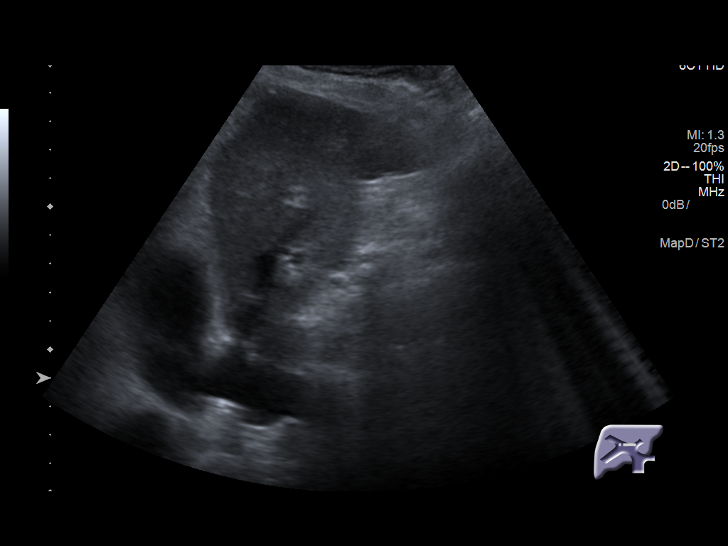
[im 28/31]
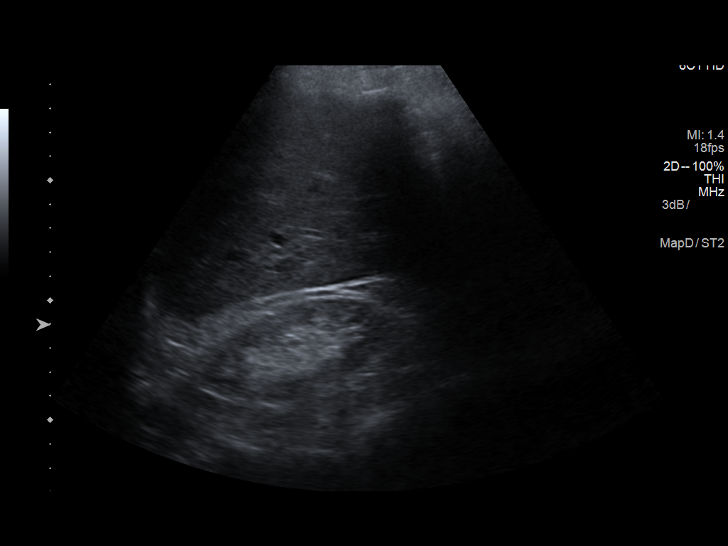
[im 31/31]
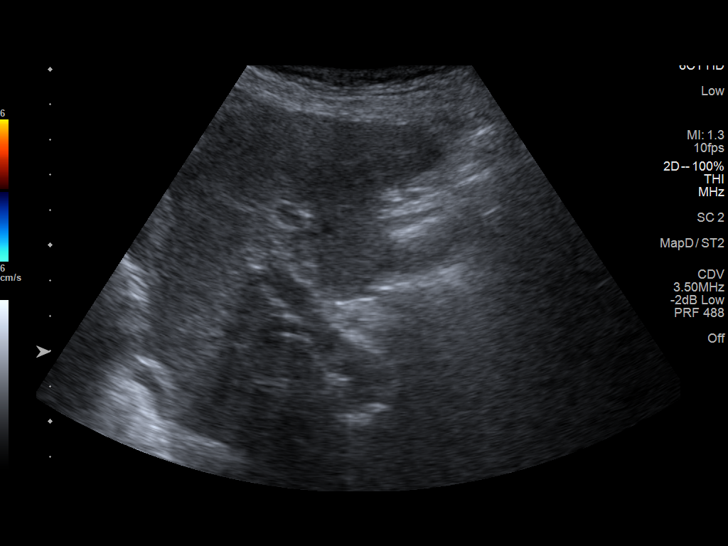

[14 of 25 positions shown; findings below may reference images not displayed]

FINDINGS: Gallbladder:

Surgically absent

Common bile duct:

Diameter: 3 mm diameter, normal

Liver:

Mildly heterogeneous echogenicity, generally isoechoic to RIGHT
kidney. No focal hepatic mass or nodularity. Portal vein is patent
on color Doppler imaging with normal direction of blood flow towards
the liver.

No RIGHT upper quadrant free fluid.
IMPRESSION: Post cholecystectomy.

No focal hepatobiliary sonographic abnormalities identified.
